# Patient Record
Sex: Female | Born: 1955 | Race: Black or African American | Hispanic: No | Marital: Single | Smoking: Former smoker
Health system: Southern US, Community
[De-identification: ages and names within clinical notes are randomized; demographics above are authoritative.]

## PROBLEM LIST (undated history)

## (undated) DIAGNOSIS — Z9889 Other specified postprocedural states: Secondary | ICD-10-CM

## (undated) DIAGNOSIS — T8859XA Other complications of anesthesia, initial encounter: Secondary | ICD-10-CM

## (undated) DIAGNOSIS — E46 Unspecified protein-calorie malnutrition: Secondary | ICD-10-CM

## (undated) DIAGNOSIS — K5900 Constipation, unspecified: Secondary | ICD-10-CM

## (undated) DIAGNOSIS — I639 Cerebral infarction, unspecified: Secondary | ICD-10-CM

## (undated) DIAGNOSIS — N183 Chronic kidney disease, stage 3 unspecified: Secondary | ICD-10-CM

## (undated) DIAGNOSIS — R112 Nausea with vomiting, unspecified: Secondary | ICD-10-CM

## (undated) DIAGNOSIS — R9389 Abnormal findings on diagnostic imaging of other specified body structures: Secondary | ICD-10-CM

## (undated) DIAGNOSIS — I679 Cerebrovascular disease, unspecified: Secondary | ICD-10-CM

## (undated) DIAGNOSIS — I517 Cardiomegaly: Secondary | ICD-10-CM

## (undated) DIAGNOSIS — I671 Cerebral aneurysm, nonruptured: Secondary | ICD-10-CM

## (undated) DIAGNOSIS — I509 Heart failure, unspecified: Secondary | ICD-10-CM

## (undated) DIAGNOSIS — I10 Essential (primary) hypertension: Secondary | ICD-10-CM

## (undated) DIAGNOSIS — I5022 Chronic systolic (congestive) heart failure: Secondary | ICD-10-CM

## (undated) HISTORY — PX: BREAST EXCISIONAL BIOPSY: SUR124

## (undated) HISTORY — DX: Abnormal findings on diagnostic imaging of other specified body structures: R93.89

## (undated) HISTORY — DX: Constipation, unspecified: K59.00

## (undated) HISTORY — DX: Heart failure, unspecified: I50.9

## (undated) HISTORY — DX: Unspecified protein-calorie malnutrition: E46

## (undated) HISTORY — DX: Cardiomegaly: I51.7

## (undated) HISTORY — DX: Cerebral aneurysm, nonruptured: I67.1

## (undated) HISTORY — DX: Essential (primary) hypertension: I10

## (undated) HISTORY — DX: Chronic systolic (congestive) heart failure: I50.22

## (undated) HISTORY — DX: Cerebrovascular disease, unspecified: I67.9

## (undated) HISTORY — DX: Chronic kidney disease, stage 3 unspecified: N18.30

## (undated) HISTORY — DX: Cerebral infarction, unspecified: I63.9

---

## 2000-12-05 ENCOUNTER — Encounter (INDEPENDENT_AMBULATORY_CARE_PROVIDER_SITE_OTHER): Payer: Self-pay | Admitting: *Deleted

## 2000-12-05 ENCOUNTER — Ambulatory Visit (HOSPITAL_BASED_OUTPATIENT_CLINIC_OR_DEPARTMENT_OTHER): Admission: RE | Admit: 2000-12-05 | Discharge: 2000-12-05 | Payer: Self-pay | Admitting: General Surgery

## 2005-08-26 ENCOUNTER — Other Ambulatory Visit: Admission: RE | Admit: 2005-08-26 | Discharge: 2005-08-26 | Payer: Self-pay | Admitting: Obstetrics and Gynecology

## 2005-09-05 ENCOUNTER — Encounter: Admission: RE | Admit: 2005-09-05 | Discharge: 2005-09-05 | Payer: Self-pay | Admitting: Family Medicine

## 2005-10-18 ENCOUNTER — Encounter: Admission: RE | Admit: 2005-10-18 | Discharge: 2005-10-18 | Payer: Self-pay | Admitting: Family Medicine

## 2006-09-22 ENCOUNTER — Encounter: Admission: RE | Admit: 2006-09-22 | Discharge: 2006-09-22 | Payer: Self-pay | Admitting: Family Medicine

## 2008-07-22 ENCOUNTER — Encounter (INDEPENDENT_AMBULATORY_CARE_PROVIDER_SITE_OTHER): Payer: Self-pay | Admitting: Cardiology

## 2008-07-22 ENCOUNTER — Inpatient Hospital Stay (HOSPITAL_COMMUNITY): Admission: EM | Admit: 2008-07-22 | Discharge: 2008-07-25 | Payer: Self-pay | Admitting: Emergency Medicine

## 2008-07-22 IMAGING — CR DG CHEST 1V PORT
1 series · 1 of 1 positions shown · non-contrast
Comparison: None

CLINICAL DATA: Cough, shortness of breath, wheezing

PORTABLE CHEST - 1 VIEW

[view not recorded]
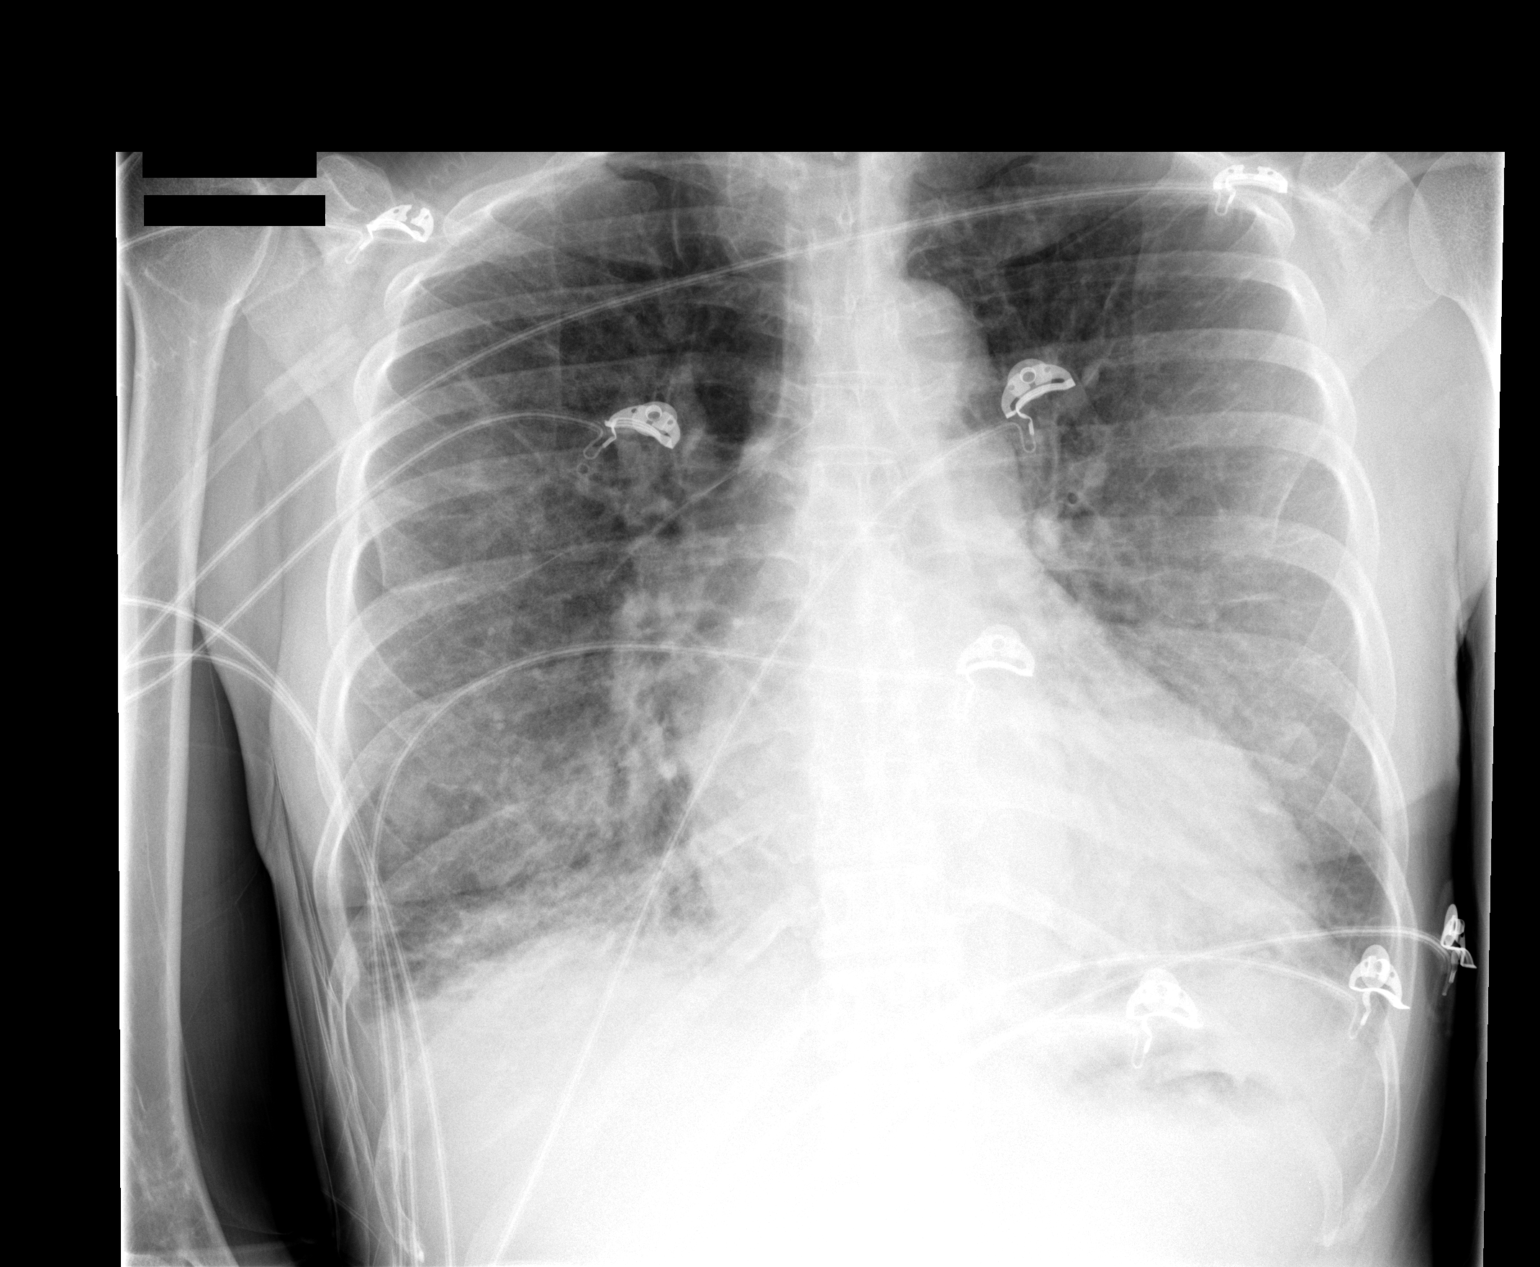

[1 of 1 positions shown; findings below may reference images not displayed]

FINDINGS: Borderline cardiomegaly noted.  Bilateral mild central
increased bronchovascular markings without convincing edema.  There
is right basilar airspace disease atelectasis or infiltrate.
Bilateral nodular nipple shadow is noted.
IMPRESSION: Bilateral mild central increased bronchovascular markings without
convincing edema.  There is hazy airspace disease right basilar
probable atelectasis or infiltrate.

## 2008-07-24 IMAGING — CR DG CHEST 2V
2 series · 2 of 2 positions shown · non-contrast
Comparison: [DATE].

CLINICAL DATA: Follow-up lung densities.

CHEST - 2 VIEW

[w chest pa]
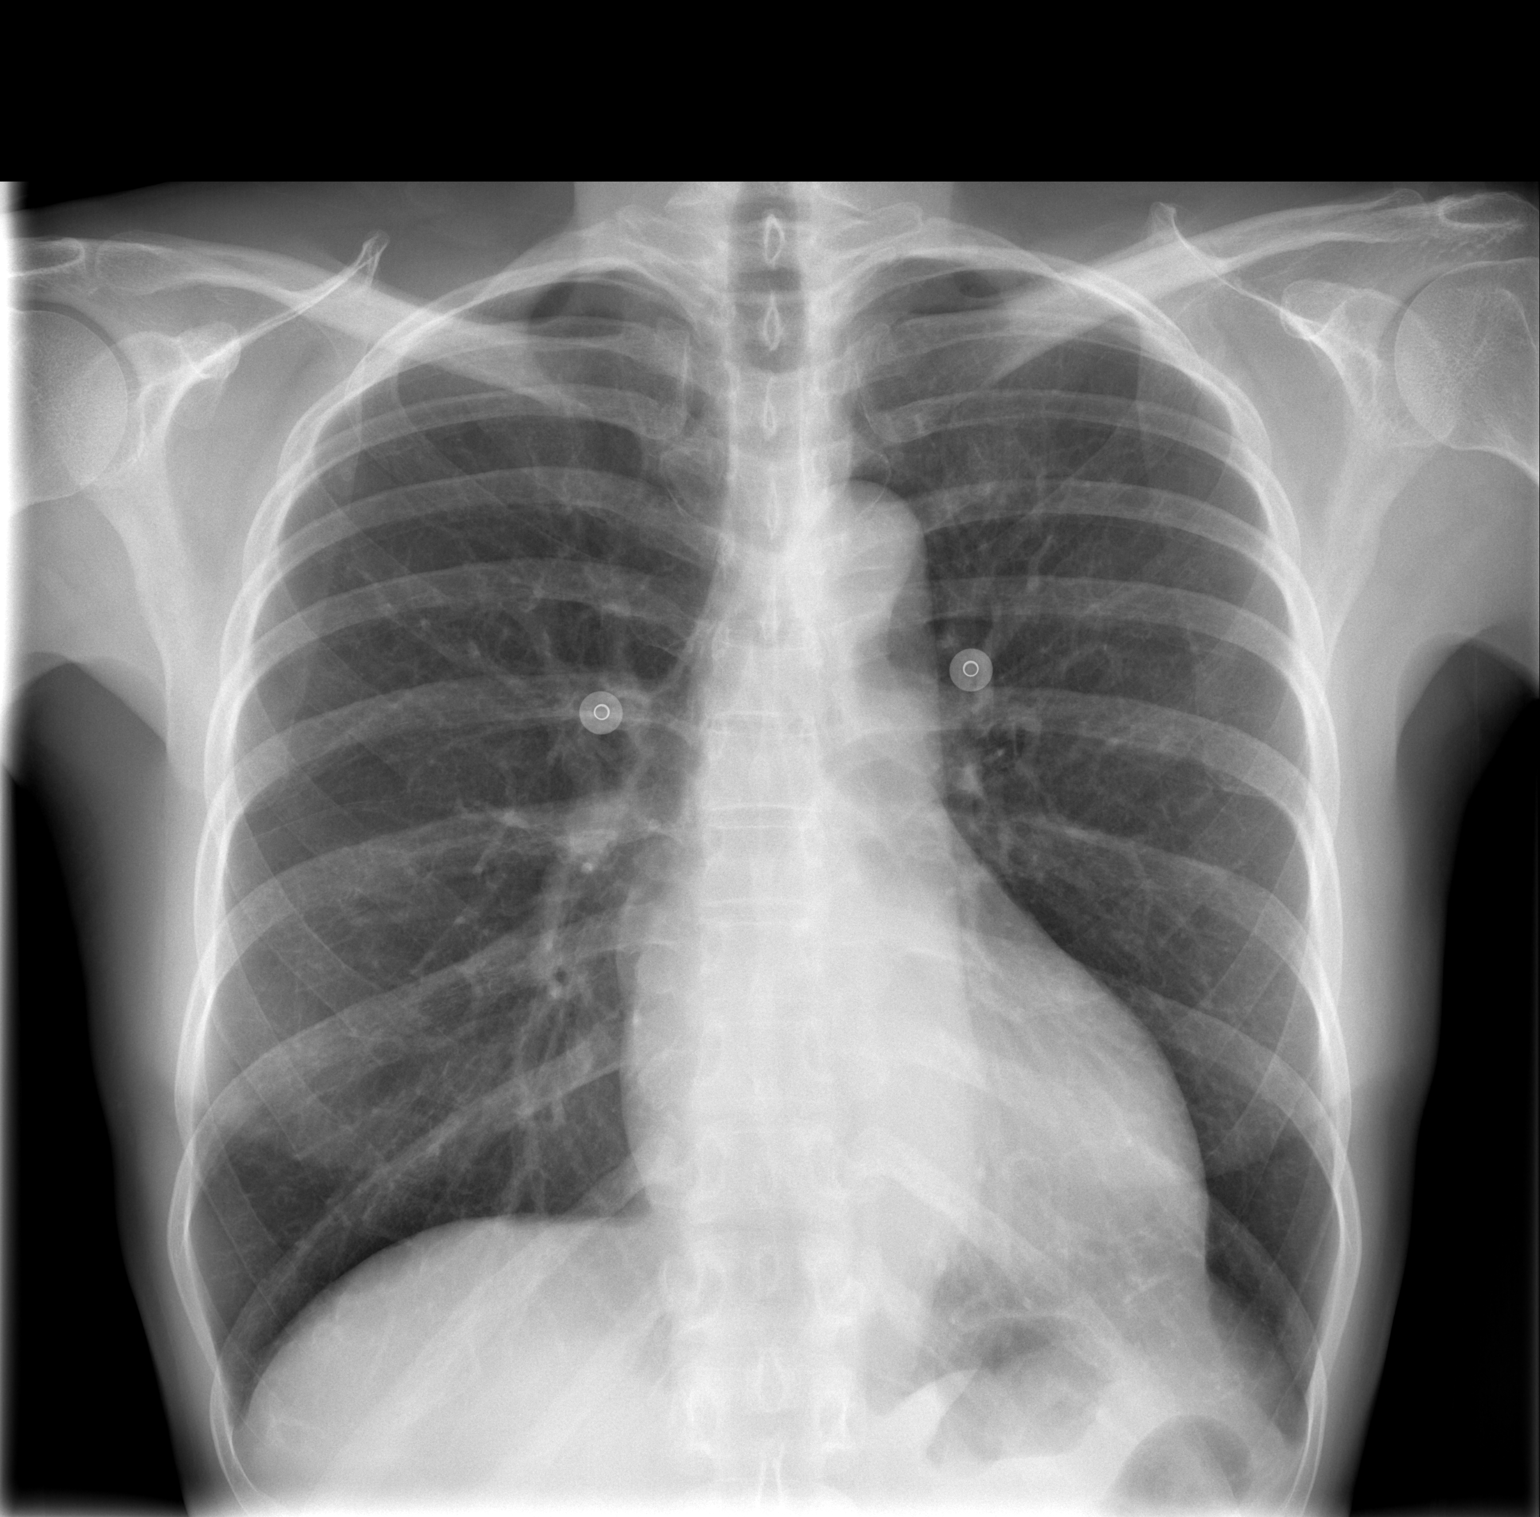

[w chest lat]
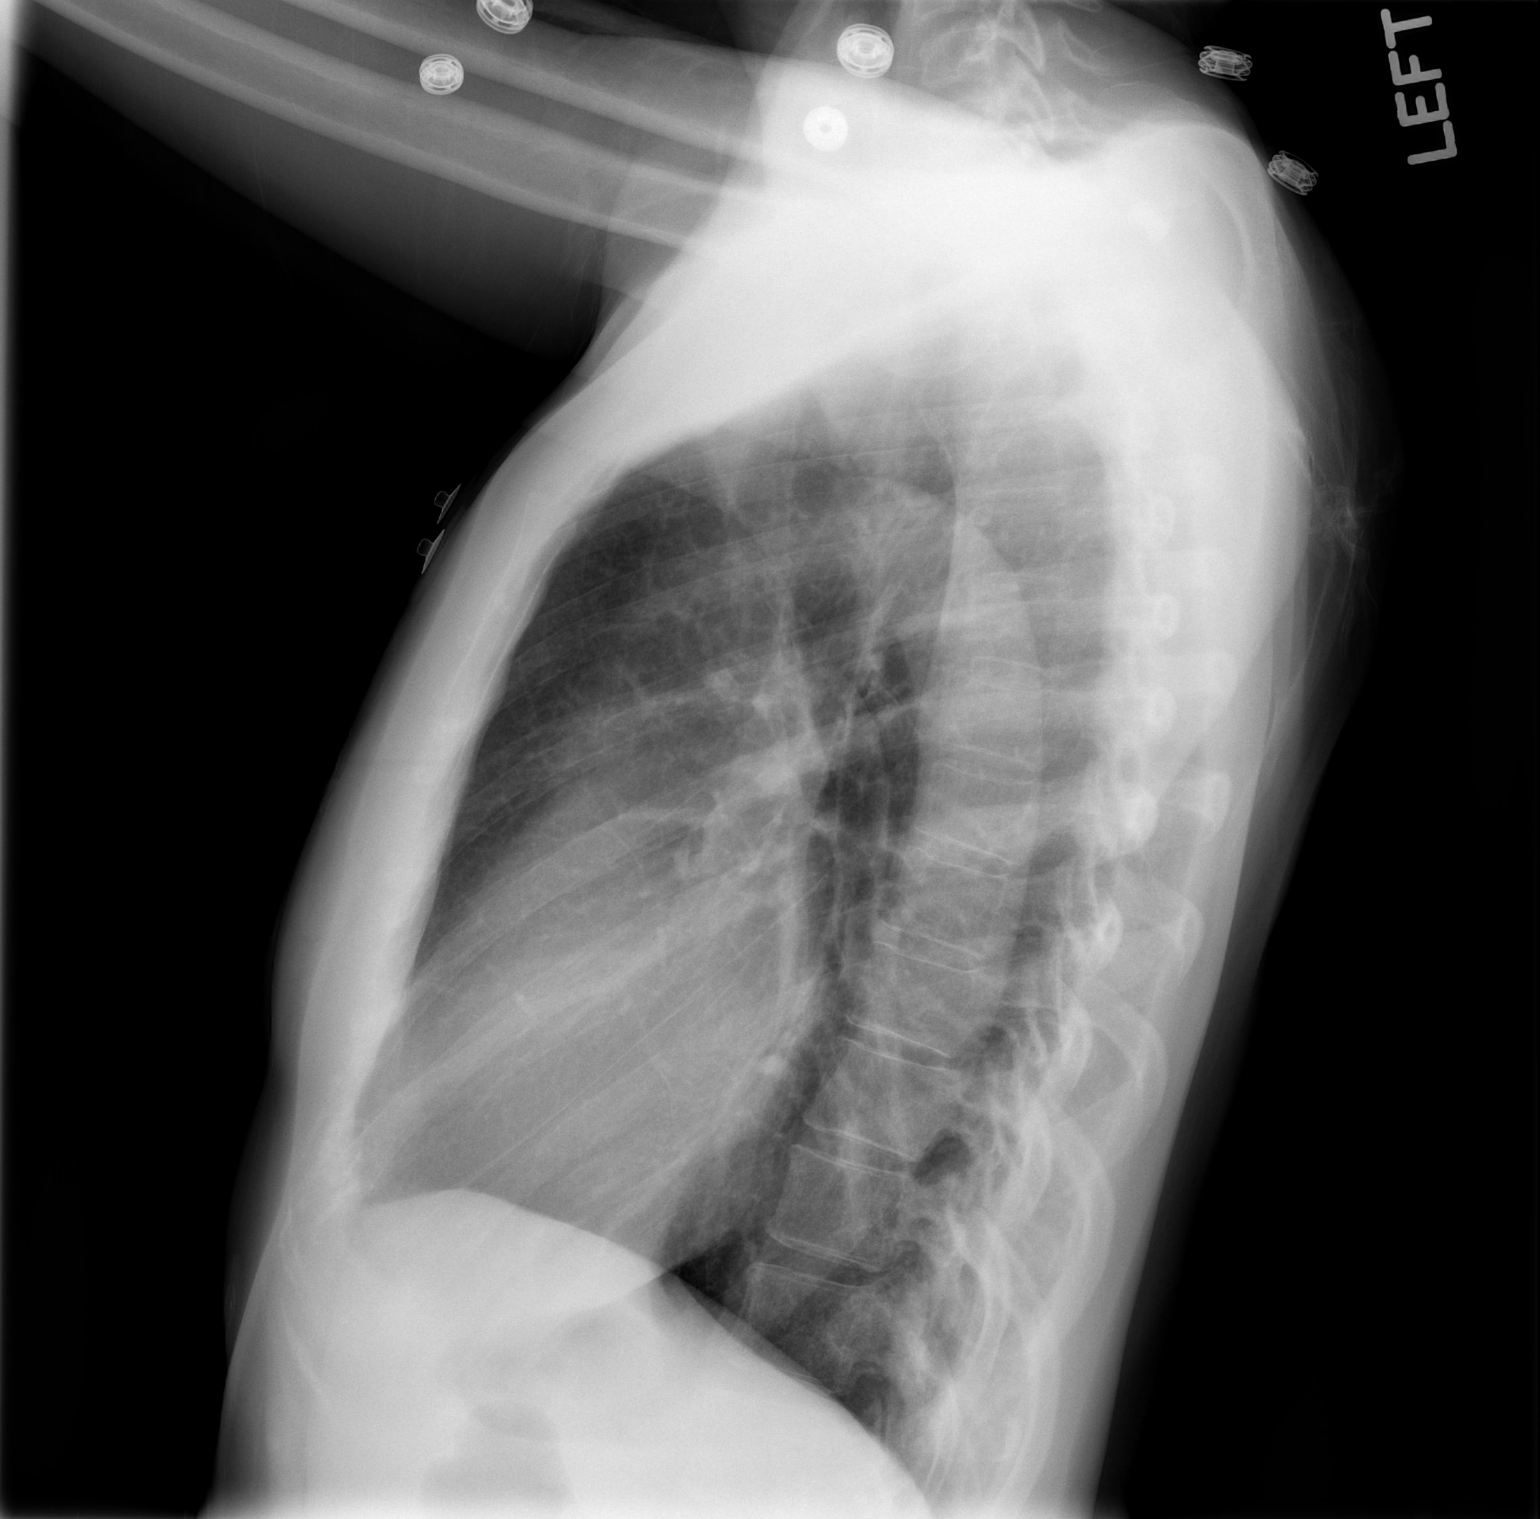

[2 of 2 positions shown; findings below may reference images not displayed]

FINDINGS: Improved inspiration.  Borderline enlarged cardiac
silhouette with an interval decrease in size.  Clear lungs with
resolution of the previously demonstrated bilateral airspace
opacity and prominence of the interstitial markings.  Unremarkable
bones.
IMPRESSION: Resolved changes of congestive heart failure.

## 2009-02-22 ENCOUNTER — Other Ambulatory Visit: Admission: RE | Admit: 2009-02-22 | Discharge: 2009-02-22 | Payer: Self-pay | Admitting: Family Medicine

## 2010-10-08 LAB — CBC
HCT: 36.4 % (ref 36.0–46.0)
MCHC: 32.6 g/dL (ref 30.0–36.0)
MCV: 86.9 fL (ref 78.0–100.0)
RBC: 4.16 MIL/uL (ref 3.87–5.11)
RDW: 13.6 % (ref 11.5–15.5)
WBC: 10.5 10*3/uL (ref 4.0–10.5)

## 2010-10-08 LAB — COMPREHENSIVE METABOLIC PANEL
Albumin: 3.5 g/dL (ref 3.5–5.2)
Calcium: 9.2 mg/dL (ref 8.4–10.5)
Chloride: 105 mEq/L (ref 96–112)
GFR calc non Af Amer: 50 mL/min — ABNORMAL LOW (ref >60)
Potassium: 3.7 mEq/L (ref 3.5–5.1)
Sodium: 141 mEq/L (ref 135–145)
Total Bilirubin: 1.4 mg/dL — ABNORMAL HIGH (ref 0.3–1.2)
Total Protein: 6.2 g/dL (ref 6.0–8.3)

## 2010-10-08 LAB — DIFFERENTIAL
Eosinophils Absolute: 0.1 10*3/uL (ref 0.0–0.7)
Eosinophils Relative: 1 % (ref 0–5)
Lymphocytes Relative: 11 % — ABNORMAL LOW (ref 12–46)
Lymphs Abs: 1.1 10*3/uL (ref 0.7–4.0)
Monocytes Relative: 2 % — ABNORMAL LOW (ref 3–12)
Neutro Abs: 9.1 10*3/uL — ABNORMAL HIGH (ref 1.7–7.7)
Neutrophils Relative %: 87 % — ABNORMAL HIGH (ref 43–77)

## 2010-10-08 LAB — URINALYSIS, ROUTINE W REFLEX MICROSCOPIC
Bilirubin Urine: NEGATIVE
Glucose, UA: NEGATIVE mg/dL
Nitrite: NEGATIVE
pH: 6.5 (ref 5.0–8.0)

## 2010-10-08 LAB — BRAIN NATRIURETIC PEPTIDE
Pro B Natriuretic peptide (BNP): 1911 pg/mL — ABNORMAL HIGH (ref 0.0–100.0)
Pro B Natriuretic peptide (BNP): 446 pg/mL — ABNORMAL HIGH (ref 0.0–100.0)

## 2010-10-08 LAB — POCT CARDIAC MARKERS: CKMB, poc: 2 ng/mL (ref 1.0–8.0)

## 2010-10-08 LAB — BASIC METABOLIC PANEL
BUN: 16 mg/dL (ref 6–23)
CO2: 24 mEq/L (ref 19–32)
CO2: 30 mEq/L (ref 19–32)
Calcium: 8.9 mg/dL (ref 8.4–10.5)
Chloride: 106 mEq/L (ref 96–112)
Creatinine, Ser: 0.91 mg/dL (ref 0.4–1.2)
GFR calc Af Amer: 44 mL/min — ABNORMAL LOW (ref >60)
GFR calc non Af Amer: >60 mL/min (ref >60)
Glucose, Bld: 127 mg/dL — ABNORMAL HIGH (ref 70–99)
Potassium: 3.5 mEq/L (ref 3.5–5.1)

## 2010-10-08 LAB — CULTURE, BLOOD (ROUTINE X 2): Culture: NO GROWTH

## 2010-10-08 LAB — URINE MICROSCOPIC-ADD ON

## 2010-10-09 LAB — BASIC METABOLIC PANEL
CO2: 27 mEq/L (ref 19–32)
Calcium: 8.8 mg/dL (ref 8.4–10.5)
Chloride: 103 mEq/L (ref 96–112)
GFR calc Af Amer: 45 mL/min — ABNORMAL LOW (ref >60)
GFR calc non Af Amer: 37 mL/min — ABNORMAL LOW (ref >60)
Glucose, Bld: 99 mg/dL (ref 70–99)
Sodium: 137 mEq/L (ref 135–145)

## 2010-11-06 NOTE — H&P (Signed)
Kendra Richardson, Kendra Richardson NO.:  [id number]   MEDICAL RECORD NO.:  [id number]          PATIENT TYPE:  EMS   LOCATION:  [facility]                         FACILITY:  [facility]   PHYSICIAN:  Hollice Espy, M.D.DATE OF BIRTH:  July 09, 1955   DATE OF ADMISSION:  07/22/2008  DATE OF DISCHARGE:                              HISTORY & PHYSICAL   .The patient's PCP is Dr. Duane Lope.   CONSULTANT:  Dr. Armanda Magic, cardiology.   CHIEF COMPLAINT:  Shortness breath.   HISTORY OF PRESENT ILLNESS:  The patient is a 55 year old African  American female with past medical history of tobacco abuse, reportedly  cardiomegaly and hypertension, who for the last month has noted  increasing shortness of breath and over the last week has gotten much  more markedly short of breath. Mild nonproductive cough with clear  sputum.  She came in and was evaluated by her PCPs partner,  Dr.  Corliss Blacker, who found the patient to be short of breath with concern about  the possibility of CHF versus COPD exacerbation, and had the patient  sent over to the emergency room. En route the patient required a large  amount of oxygenation, 4-6 liters, and then following that needed  multiple breathing treatments, initially satting in the 70-80% on  breathing treatments and on face mask she was able to sat up to 95 and  96%.  She then improved to the point where she was able to sat 98% on 3  liters.   Labs were drawn on  the patient. She was found to have a BNP of 1055  with no previous history of CHF. Renal function was normal. White count  10.5 but with an 87% shift.  Cardiac markers were unremarkable.  Urinalysis unremarkable as well.  An ABG has been ordered, and results  are currently pending.  Chest x-ray then showed bilateral mild central  increased bronchovascular markings without convincing edema, hazy air  space disease, right basilar atelectasis versus infiltrate.  The patient  was evaluated by cardiology  who felt that she might have some mild CHF,  but may be more exacerbated by an underlying lung process.  Currently  the patient states her breathing is a little bit better.  She denies any  headaches, vision changes, dysphagia, chest pain, palpitations.  She has  complained of some mild shortness although much better while at rest and  with oxygen. Some mild wheezing and mild coughing productive of clear  sputum. No abdominal pain.  No hematuria, dysuria, constipation,  diarrhea, focal extremity numbness, weakness, or pain.   REVIEW OF SYSTEMS:  Otherwise negative.   The patient's past medical history includes hypertension, cardiomegaly,  and tobacco abuse.   MEDICATIONS:  She is on quinapril. She previously was on a diuretic  although it was stopped some time ago.  She smokes about one cigarette  pack every 2 weeks. Denies any alcohol or drug use.   FAMILY HISTORY:  Noted for CAD and MI.   PHYSICAL EXAMINATION:  VITALS:  On admission temperature 96.9. Heart  rate initially 105, down to 98. Blood pressure initially 221/146, down  to 156/81. Respirations 28, now down to 20. O2 sat 93% initially on 4  liters, up to 98% on 3.  GENERAL:  She is alert and oriented x3, in some moderate distress  secondary to weakness and some mild shortness of breath.  HEENT: Normocephalic atraumatic. Mucous membranes are moist. She has no  carotid bruits.  HEART:  Regular rate and rhythm.  Soft 2/6 systolic ejection murmur.  LUNGS:  Decreased breath sounds throughout.  ABDOMEN:  Soft, nontender, nondistended.  Positive bowel sounds.  EXTREMITIES:  No clubbing, cyanosis, trace pitting edema.   LAB WORK:  BNP 1055.  Sodium 142, potassium 3.5, chloride 106, bicarb  24, BUN 15, creatinine 0.9, glucose 127.  White count 10.5, H and H 13.7  and 41. MCV 88, platelet count 164, with 87% neutrophils.  CPK 114, MB  2, troponin-I less than 0.05.  Urinalysis is normal.   ASSESSMENT AND PLAN:  1. Congestive  heart failure.  .  2. Chronic obstructive pulmonary disease.  3. Tobacco.  4. Hypertension.   Will await her ABG. In the meantime will treat with DuoNeb, oxygen and  IV Lasix. Dr. Mayford Knife has seen and assessed the patient and is ordering  an echo, the results of which are pending.  In the meantime, will check  serial enzymes and a TSH.  Please note that also the patient is on a  nitroglycerin drip, and her blood pressure should improve as her  oxygenation does. In the meantime will go with clonidine 0.2 mg p.o.  b.i.d. and titrate up.      Hollice Espy, M.D.  Electronically Signed     SKK/MEDQ  D:  07/22/2008  T:  07/22/2008  Job:  [id number]   cc:   Armanda Magic, M.D.  Vianne Bulls, M.D.

## 2010-11-06 NOTE — Discharge Summary (Signed)
NAMEEVY, LUTTERMAN NO.:  [id number]   MEDICAL RECORD NO.:  [id number]          PATIENT TYPE:  INP   LOCATION:  4738                         FACILITY:  [facility]   PHYSICIAN:  Hollice Espy, M.D.DATE OF BIRTH:  1955/08/14   DATE OF ADMISSION:  07/22/2008  DATE OF DISCHARGE:  07/25/2008                               DISCHARGE SUMMARY   PRIMARY CARE PHYSICIAN:  C. Duane Lope, MD   CONSULTANTS:  Dr. Armanda Magic, cardiology   DISCHARGE DIAGNOSES:  1. Acute systolic congestive heart failure, new onset diagnosis, with      ejection fraction of 45% to 50%.  2. Hypertension.  3. Tobacco abuse.  4. Hypokalemia, now resolved.  5. Acute renal failure.   HOSPITAL COURSE:  The patient is a 55 year old African American female  with past medical history of tobacco abuse, hypertension and reported  cardiomegaly who had presented with increased shortness of breath, and  initially, she was found to have some low-grade temps, and there was a  concern for the possibility she may be having evidence of pneumonia as  well.  A BNP was checked and found to be elevated around 1700.  Cardiology was consulted but deferred patient to medicine because they  thought that she also may have a respiratory component as well such as  pneumonia or COPD.  The patient was diuresed and by hospital day 2 was  showing some signs of improvement.  She had an initial white count of  10.5 with an 87% shift.  Her white count on hospital day 2 was only down  to 8.2 following diuresis.  She was feeling better and again showed no  signs of any infection, so this was considered to all be secondary to  congestive heart failure.  An echocardiogram was performed which showed  a decreased ejection fraction down to 45% to 50% and mild.  By hospital  day 3, the patient had responded well to diuresis.  Her BNP had come  down to 400.  A chest x-ray showed resolution of CHF.  By July 24, 2008, her BUN and  creatinine were slightly up to 28 and 1.48  respectively.  At this point, her Lasix was held, and she was put on  some gentle hydration.  By day of discharge, 07/25/2008, her BUN was 38,  creatinine 1.47.  Initially, I talked to the patient about staying for  an additional day of IV fluids, but she said she was otherwise feeling  much better and would like very much to go home.  The plan is then to  hold the patient's Lasix and restart it on July 27, 2008.  I have  spoken with cardiology and will set up an appointment for followup with  Dr. Eldridge Dace in 1 week's time on August 01, 2008, at 1:15 p.m.  In  regards to her hypokalemia, following Lasix, her potassium came down to  3.2.  On July 24, 2008, it was replaced.  Her potassium as of the day  of discharge was 3.9.  In regards to her renal failure, this likely was  both in combination to diuresis  and some over- diuresis.  She may have a  slightly elevated BUN and creatinine from this point on secondary to  Lasix, but we will hold it for 2 days and restart.  In regards to her  hypertension, initially, she did not know her previous antihypertensive.  We put her on clonidine to keep her blood pressures which were initially  high down.  When her family brought in the medicine, she was found be on  quinapril.  We would favor continuing the patient on quinapril rather  than clonidine which is better for her congestive heart failure.  With  her slight increase in BUN and creatinine, we will plan to hold  quinapril for 2 days and restart on July 27, 2008.  Dr. Eldridge Dace and  Dr. Tenny Craw can follow her renal function levels when she sees them for  followup visits.   The patient will follow with Dr. Eldridge Dace again on August 01, 2008, and  follow up with her PCP, Dr. Tenny Craw, in 2 weeks.   DISCHARGE DIET:  Heart-healthy diet.  She is receiving instruction  booklet for CHF to advise her to check her daily weights and watch for  lower extremity  swelling and also to keep her on a heart-healthy diet.   DISPOSITION:  Her overall disposition is improved.   ACTIVITY:  Will be slowly increased, and she is being discharged to  home.      Hollice Espy, M.D.  Electronically Signed     SKK/MEDQ  D:  07/25/2008  T:  07/25/2008  Job:  [id number]   cc:   C. Duane Lope, M.D.  Corky Crafts, MD  Armanda Magic, M.D.

## 2010-11-09 NOTE — Op Note (Signed)
[facility]  Patient:    Kendra Richardson, Kendra Richardson                   MRN: [id number] Proc. Date: 12/05/00 Attending:  Gita Kudo, M.D. CC:         Bing Neighbors. Tenny Craw, M.D.   Operative Report  PREOPERATIVE DIAGNOSIS:  Hemorrhoid.  POSTOPERATIVE DIAGNOSIS:  Hemorrhoid.  OPERATION PERFORMED:  Hemorrhoidectomy.  SURGEON:  Gita Kudo, M.D.  ANESTHESIA:  General.  INDICATIONS FOR PROCEDURE:  The patient is a 55 year old female brought in for elective surgery.  She had hemorrhoidectomy by myself approximately 20 years go.  She has developed a solitary large hemorrhoid in her posterior midline. It is friable, hurts and bleeds.  OPERATIVE FINDINGS:  The patient had a large irritated mixed internal and external hemorrhoid in the posterior midline.  There is a smaller internal hemorrhoid just in the left posterolateral position.  DESCRIPTION OF PROCEDURE:  The patient was placed in the lithotomy position and prepped and draped in a standard fashion.  Anoscope inserted and the findings above were noted.  With good exposure, a hemostat was placed on the skin distal to the hemorrhoid and then a second one at the junction of the mucosa and skin.  With good exposure and traction, a 2-0 chromic suture was placed as a figure-of-eight to secure the pedicle and then cautery used to excise the hemorrhoidal complex in a diamond-shaped fashion, conserving mucosa. The hemorrhoid was then ligated at its stalk with the same suture and the ends of the suture left long and the hemorrhoid excised.  The wound was then infiltrated with Marcaine with epinephrine for postoperative analgesia and closed in a continuous fashion with the same running 2-0 chromic suture. It was extended as a subcutaneous suture closing the wound totally and the ends of the suture looked long.  The small left posterolateral hemorrhoid was then oversewn with 2-0 chromic.  The wound was  checked for hemostasis, dressed.  The patient was transferred to the recovery room in good condition. DD:  12/05/00 TD:  12/05/00 Job: [id number]

## 2014-08-18 ENCOUNTER — Other Ambulatory Visit (HOSPITAL_COMMUNITY)
Admission: RE | Admit: 2014-08-18 | Discharge: 2014-08-18 | Disposition: A | Discharge status: Home / Self Care | Payer: Self-pay | Source: Ambulatory Visit | Attending: Family Medicine | Admitting: Family Medicine

## 2014-08-18 ENCOUNTER — Other Ambulatory Visit: Payer: Self-pay | Admitting: Family Medicine

## 2014-08-18 DIAGNOSIS — Z124 Encounter for screening for malignant neoplasm of cervix: Secondary | ICD-10-CM | POA: Insufficient documentation

## 2014-08-19 LAB — CYTOLOGY - PAP

## 2018-12-03 LAB — IFOBT (OCCULT BLOOD): IFOBT: NEGATIVE

## 2019-05-10 ENCOUNTER — Other Ambulatory Visit: Payer: Self-pay | Admitting: Family Medicine

## 2019-05-10 DIAGNOSIS — Z1231 Encounter for screening mammogram for malignant neoplasm of breast: Secondary | ICD-10-CM

## 2019-05-24 ENCOUNTER — Other Ambulatory Visit: Payer: Self-pay

## 2019-05-24 ENCOUNTER — Ambulatory Visit
Admission: RE | Admit: 2019-05-24 | Discharge: 2019-05-24 | Disposition: A | Discharge status: Home / Self Care | Payer: No Typology Code available for payment source | Source: Ambulatory Visit | Attending: Family Medicine | Admitting: Family Medicine

## 2019-05-24 DIAGNOSIS — Z1231 Encounter for screening mammogram for malignant neoplasm of breast: Secondary | ICD-10-CM

## 2019-05-24 IMAGING — MG DIGITAL SCREENING BILAT W/ TOMO W/ CAD
8 series · 9 of 24 positions shown · non-contrast
Comparison: None.

CLINICAL DATA: Screening.

EXAM:
DIGITAL SCREENING BILATERAL MAMMOGRAM WITH TOMO AND CAD

[L CC synth-2D]
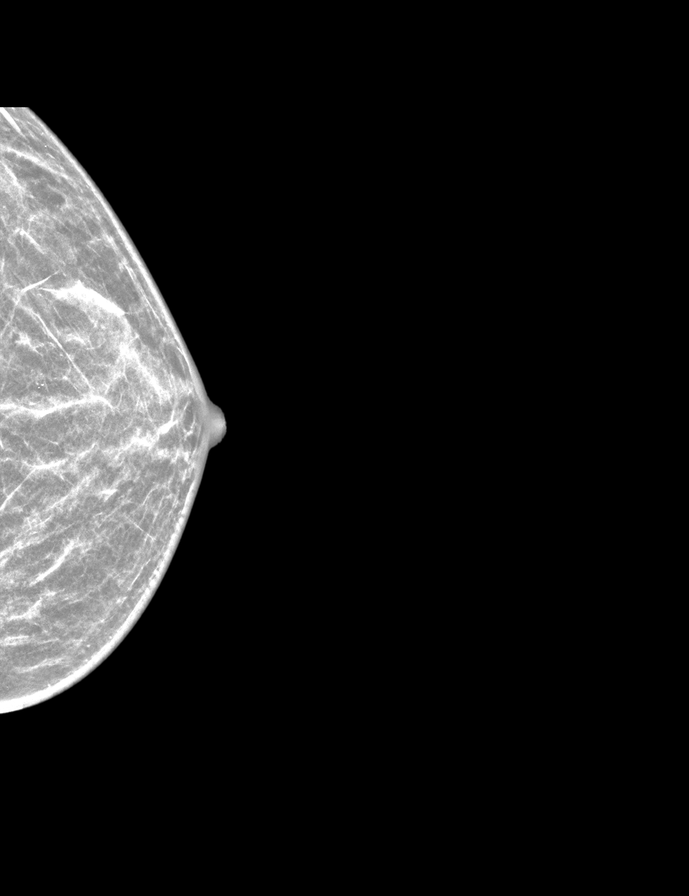

[L MLO synth-2D]
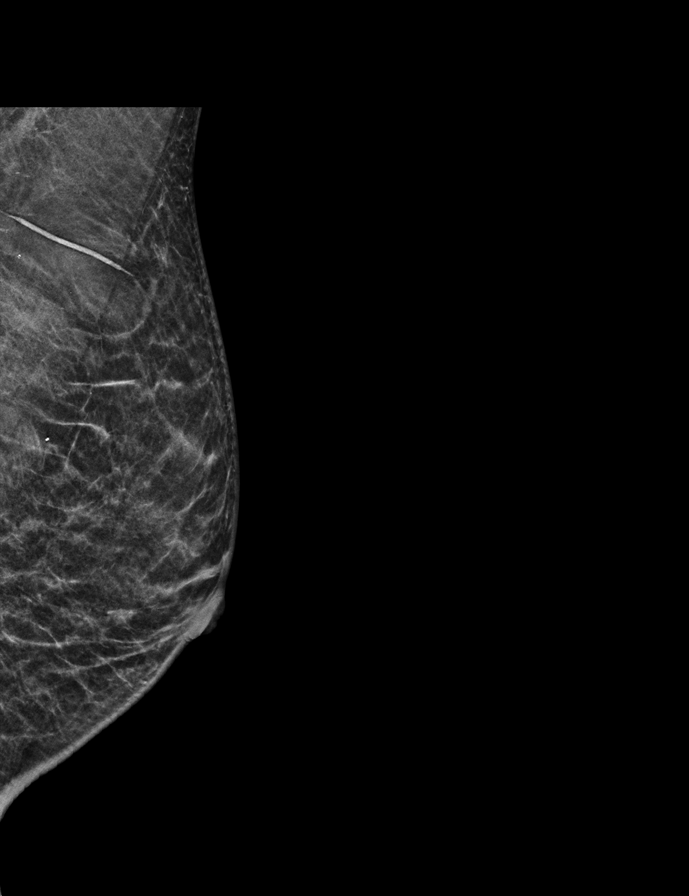

[R CC synth-2D]
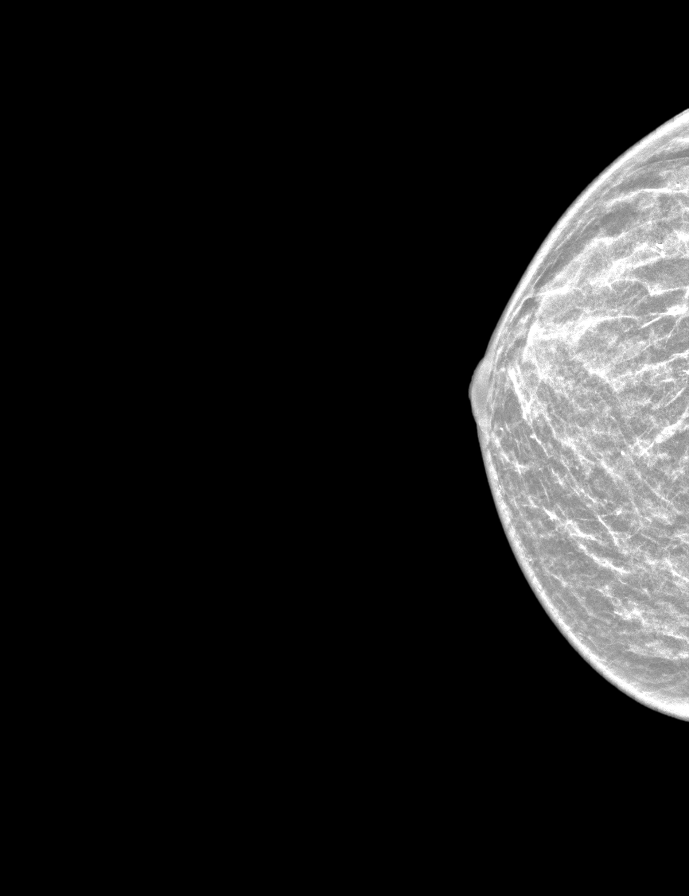

[R MLO synth-2D]
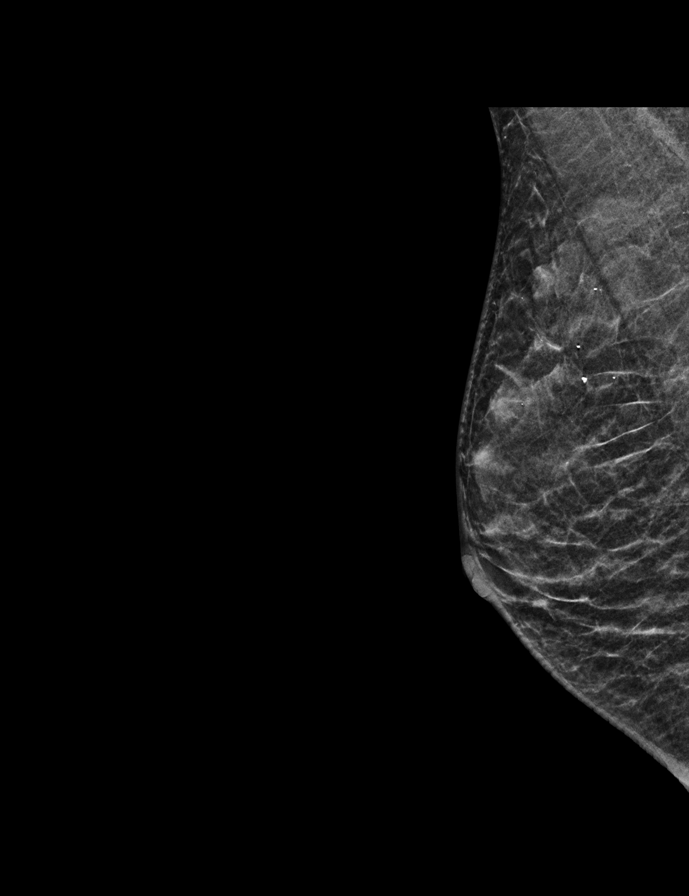

[L CC tomo · 2 of 36 frames shown]
[frame 12/36]
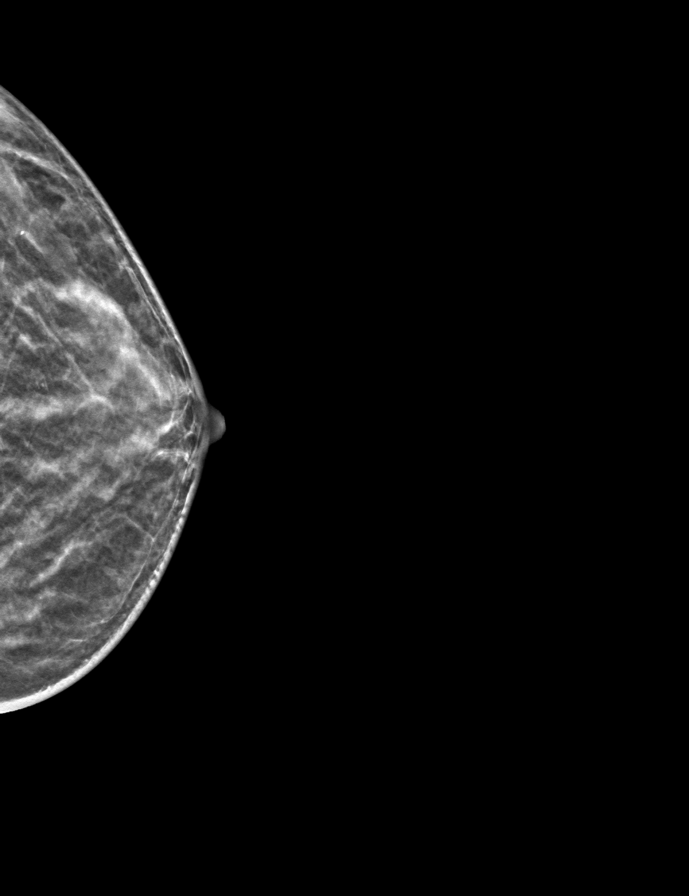
[frame 19/36]
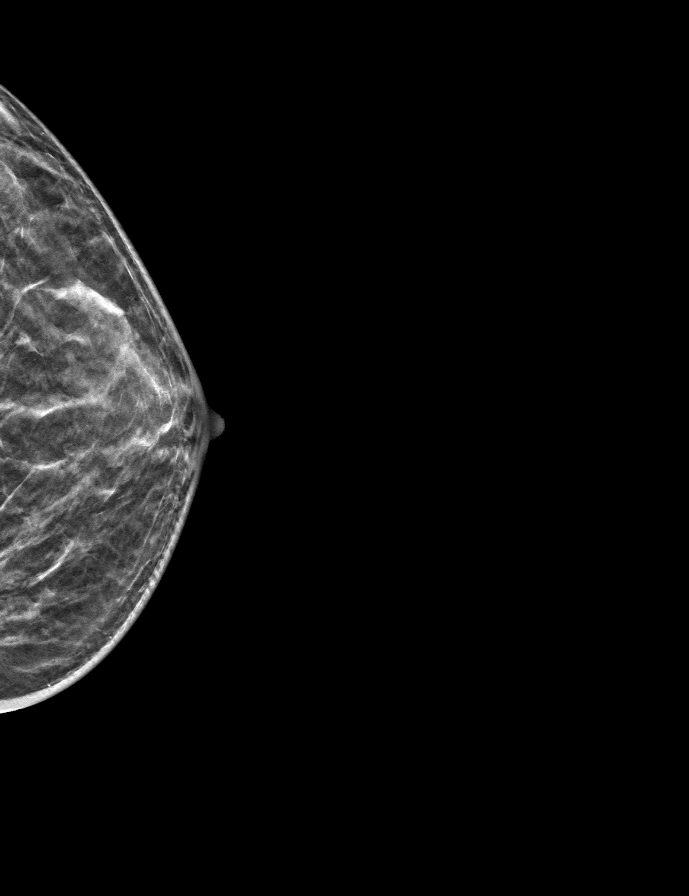

[L MLO tomo · tomo slice 17/33.0]
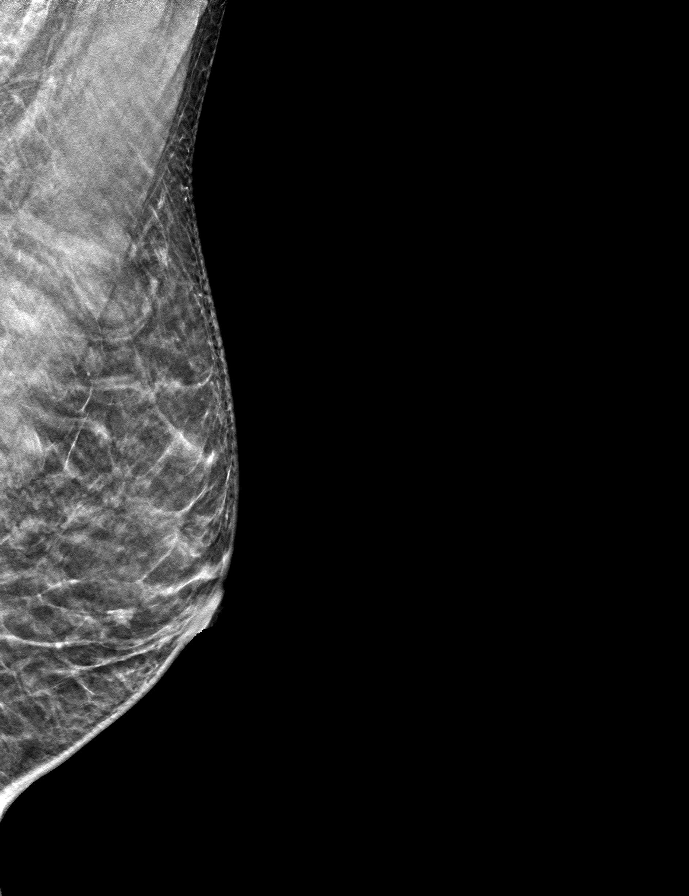

[R CC tomo · tomo slice 17/33.0]
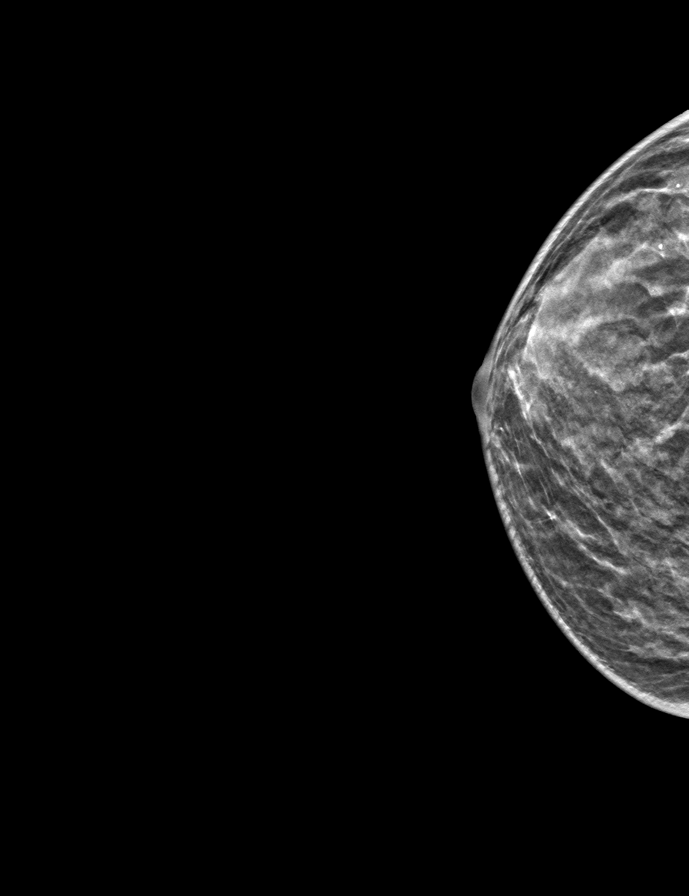

[R MLO tomo · tomo slice 17/33.0]
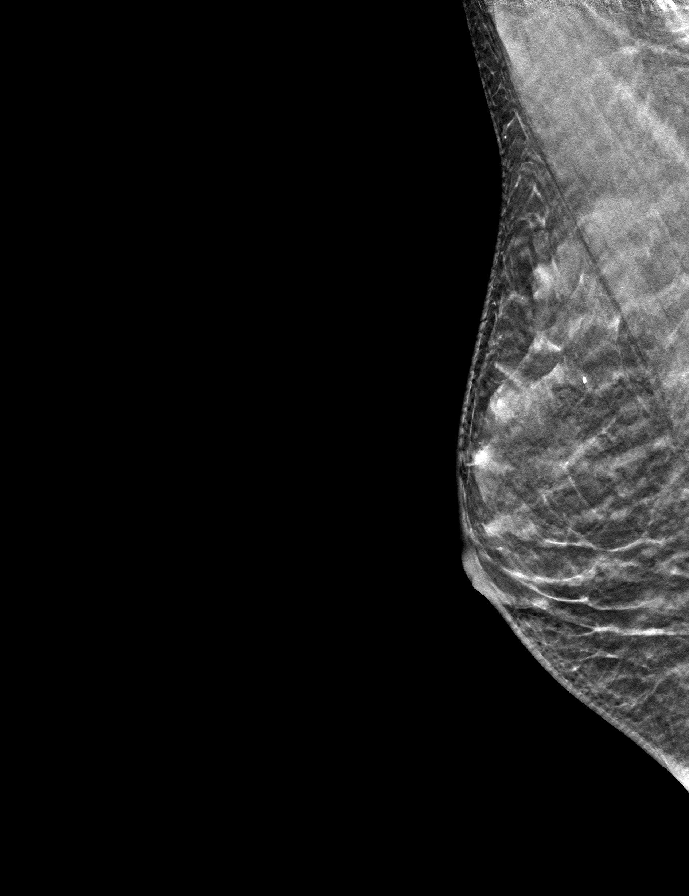

[9 of 24 positions shown; findings below may reference images not displayed]

ACR Breast Density Category b: There are scattered areas of
fibroglandular density.
FINDINGS: In the left breast, a possible asymmetry warrants further
evaluation. In the right breast, no findings suspicious for
malignancy. Images were processed with CAD.
IMPRESSION: Further evaluation is suggested for possible asymmetry in the left
breast.

RECOMMENDATION:
Diagnostic mammogram and possibly ultrasound of the left breast.
(Code:[OL])

The patient will be contacted regarding the findings, and additional
imaging will be scheduled.

BI-RADS CATEGORY  0: Incomplete. Need additional imaging evaluation
and/or prior mammograms for comparison.

## 2019-05-25 ENCOUNTER — Other Ambulatory Visit: Payer: Self-pay | Admitting: Family Medicine

## 2019-05-25 DIAGNOSIS — R928 Other abnormal and inconclusive findings on diagnostic imaging of breast: Secondary | ICD-10-CM

## 2019-05-31 ENCOUNTER — Ambulatory Visit: Payer: No Typology Code available for payment source

## 2019-05-31 ENCOUNTER — Other Ambulatory Visit: Payer: Self-pay

## 2019-05-31 ENCOUNTER — Ambulatory Visit
Admission: RE | Admit: 2019-05-31 | Discharge: 2019-05-31 | Disposition: A | Discharge status: Home / Self Care | Payer: No Typology Code available for payment source | Source: Ambulatory Visit | Attending: Family Medicine | Admitting: Family Medicine

## 2019-05-31 DIAGNOSIS — R928 Other abnormal and inconclusive findings on diagnostic imaging of breast: Secondary | ICD-10-CM

## 2019-05-31 IMAGING — MG MM DIGITAL DIAGNOSTIC UNILAT*L* W/ TOMO W/ CAD
4 series · 4 of 12 positions shown · non-contrast
Comparison: Screening exam, [DATE].

CLINICAL DATA: Screening recall for a possible left breast
asymmetry.

EXAM:
DIGITAL DIAGNOSTIC UNILATERAL LEFT MAMMOGRAM WITH CAD AND TOMO

[L CC synth-2D]
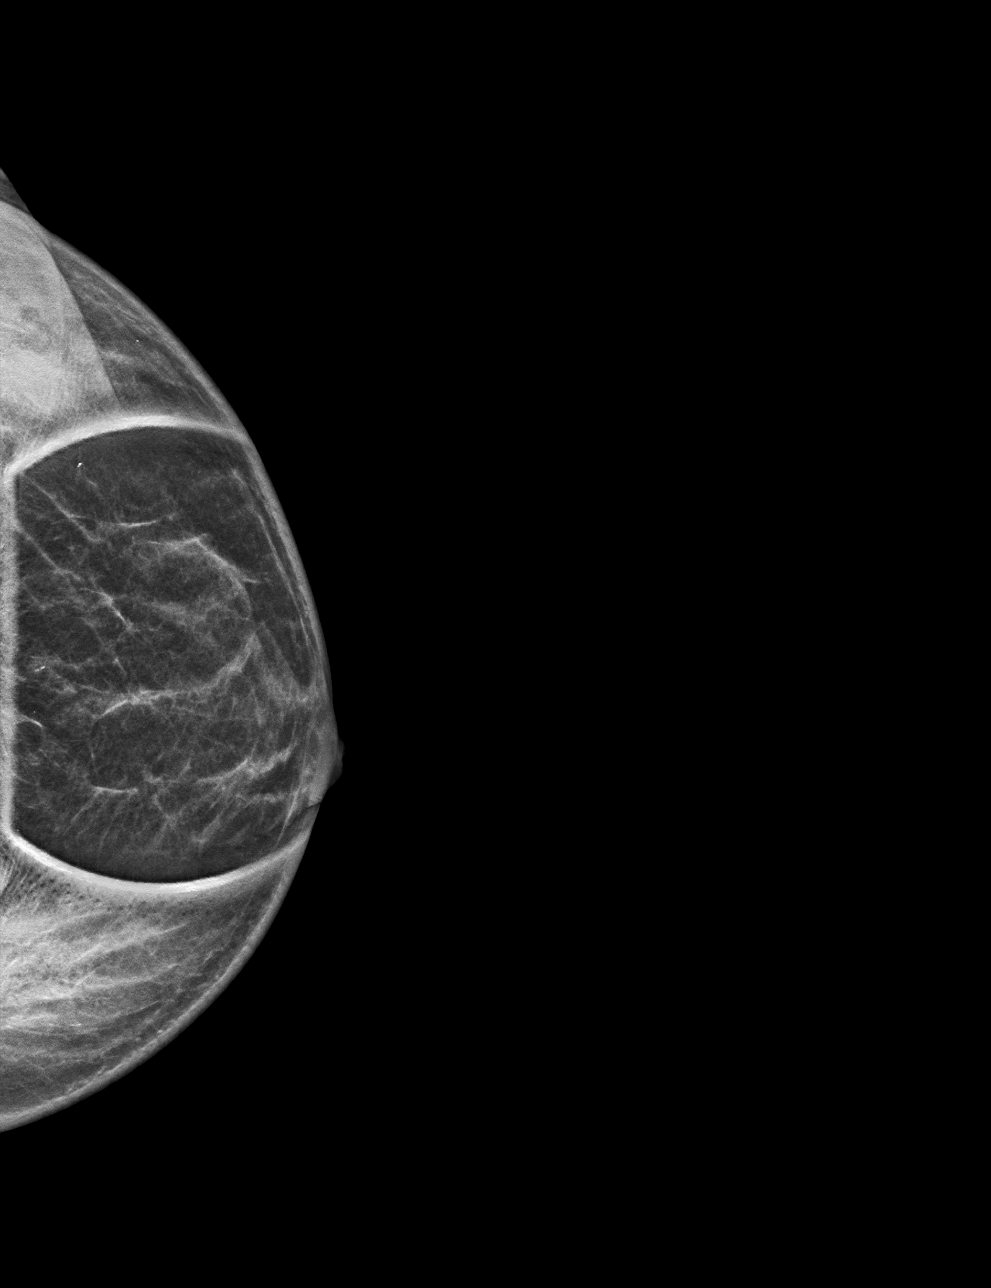

[L MLO synth-2D]
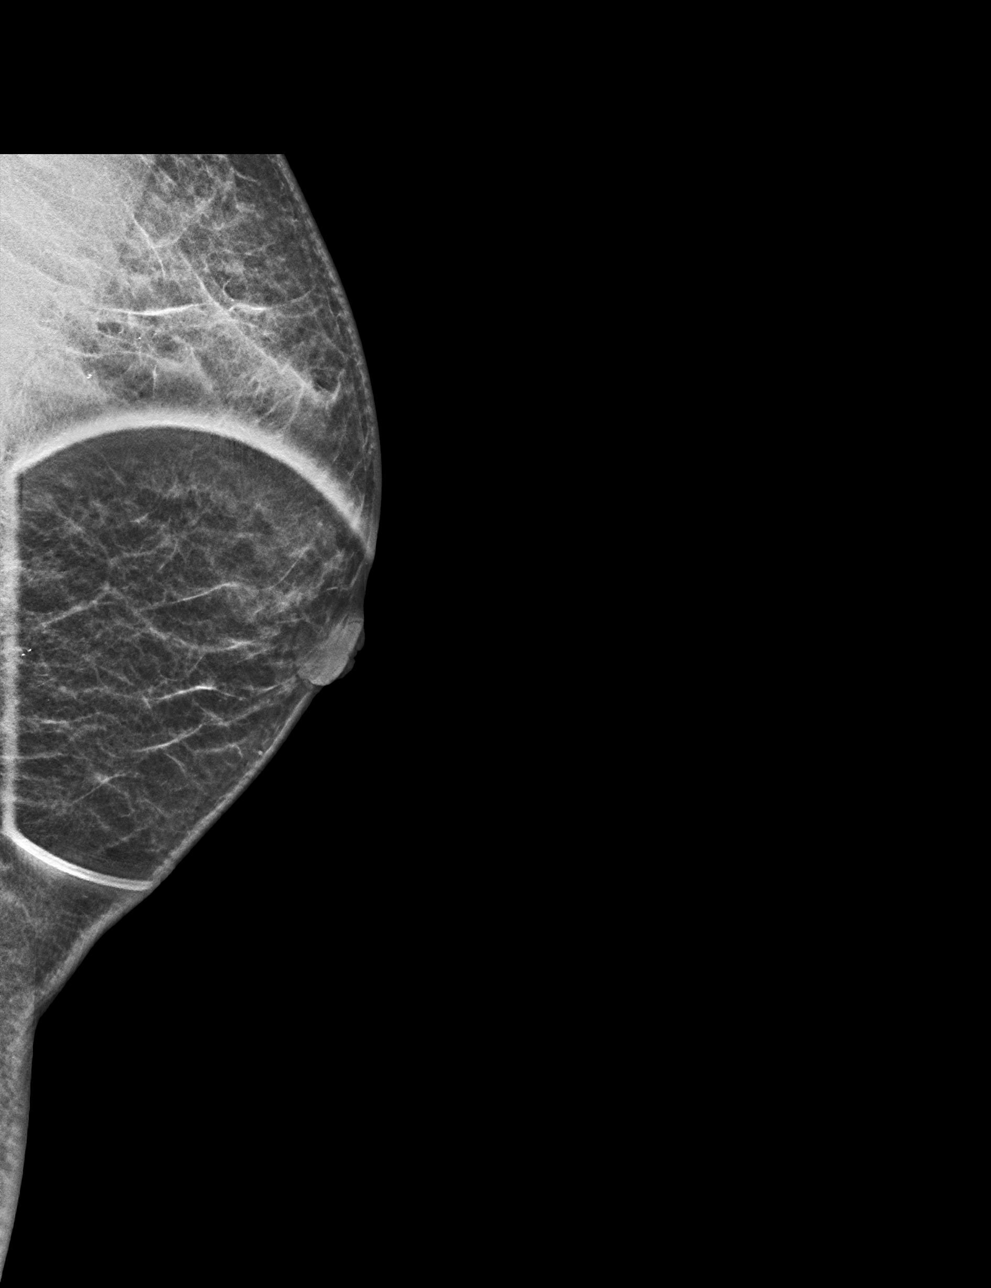

[L CC tomo · tomo slice 17/33.0]
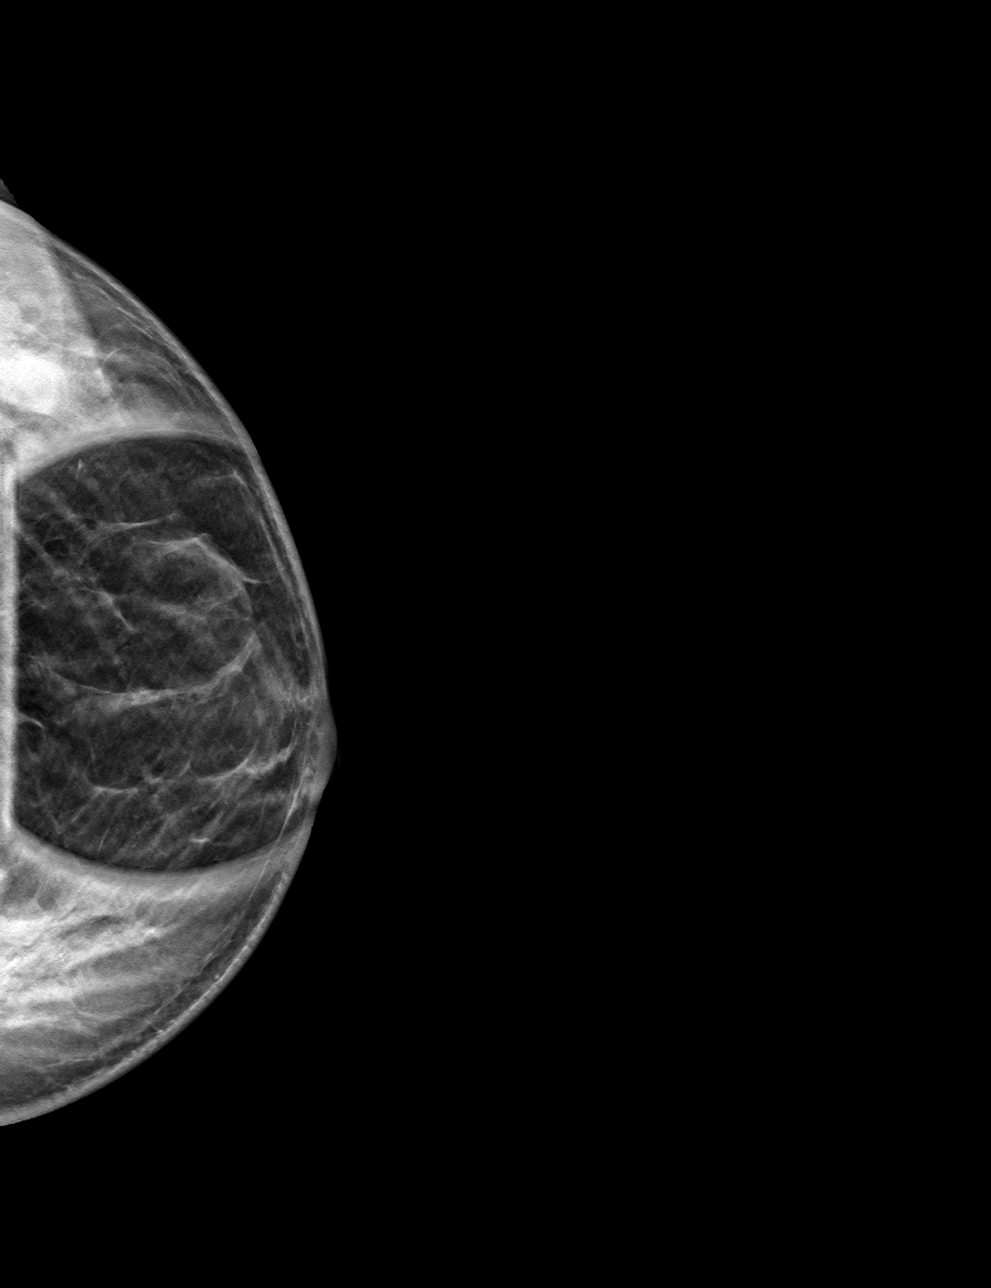

[L MLO tomo · tomo slice 17/32.0]
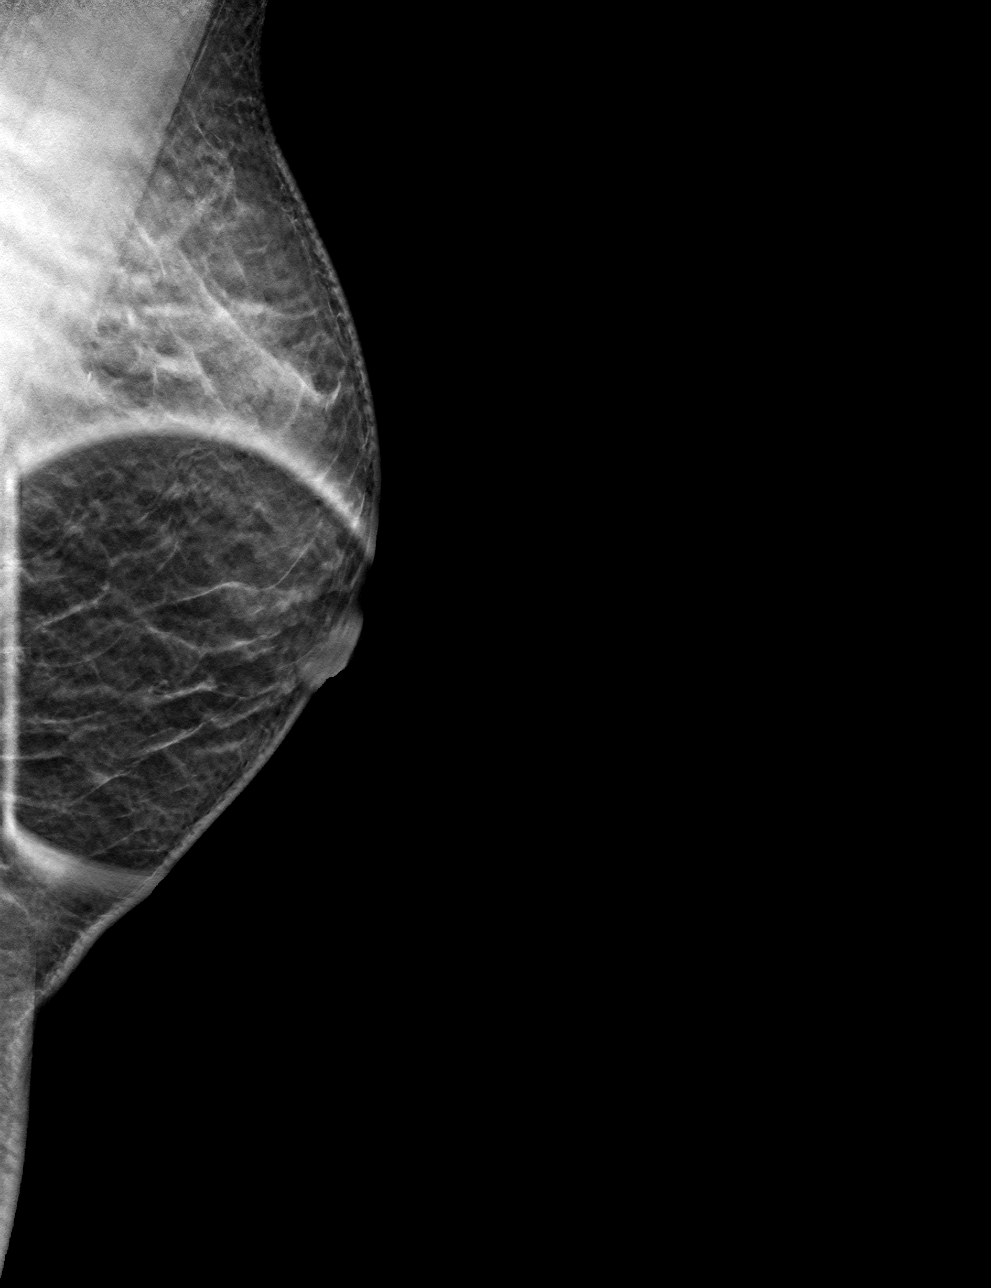

[4 of 12 positions shown; findings below may reference images not displayed]

There are no older exams
available.

ACR Breast Density Category b: There are scattered areas of
fibroglandular density.
FINDINGS: The areas of possible asymmetry disperses on the spot compression
images of the left breast, consistent with superimposed normal
fibroglandular tissue.

There are no masses, areas of architectural distortion or areas of
significant residual asymmetry. No suspicious calcifications.

Mammographic images were processed with CAD.
IMPRESSION: Negative exam.  No evidence of breast malignancy.

RECOMMENDATION:
Screening mammogram in one year.(Code:[3Q])

I have discussed the findings and recommendations with the patient.
If applicable, a reminder letter will be sent to the patient
regarding the next appointment.

BI-RADS CATEGORY  1: Negative.

## 2019-06-14 ENCOUNTER — Other Ambulatory Visit: Payer: Self-pay | Admitting: Family Medicine

## 2019-06-14 DIAGNOSIS — R5381 Other malaise: Secondary | ICD-10-CM

## 2019-06-21 ENCOUNTER — Other Ambulatory Visit: Payer: Self-pay | Admitting: Family Medicine

## 2019-06-21 DIAGNOSIS — N183 Chronic kidney disease, stage 3 unspecified: Secondary | ICD-10-CM

## 2019-06-21 DIAGNOSIS — I10 Essential (primary) hypertension: Secondary | ICD-10-CM

## 2019-06-21 DIAGNOSIS — I517 Cardiomegaly: Secondary | ICD-10-CM

## 2019-06-22 ENCOUNTER — Other Ambulatory Visit: Payer: No Typology Code available for payment source

## 2019-06-23 ENCOUNTER — Ambulatory Visit
Admission: RE | Admit: 2019-06-23 | Discharge: 2019-06-23 | Disposition: A | Discharge status: Home / Self Care | Payer: No Typology Code available for payment source | Source: Ambulatory Visit | Attending: Family Medicine | Admitting: Family Medicine

## 2019-06-23 DIAGNOSIS — I517 Cardiomegaly: Secondary | ICD-10-CM

## 2019-06-23 DIAGNOSIS — I10 Essential (primary) hypertension: Secondary | ICD-10-CM

## 2019-06-23 DIAGNOSIS — R5381 Other malaise: Secondary | ICD-10-CM

## 2019-06-23 DIAGNOSIS — N183 Chronic kidney disease, stage 3 unspecified: Secondary | ICD-10-CM

## 2019-06-23 IMAGING — US US CAROTID DUPLEX BILAT
1 series · 13 of 24 positions shown · non-contrast
Comparison: None.

CLINICAL DATA: 63-year-old female with a history of cardiovascular
risk factors

EXAM:
BILATERAL CAROTID DUPLEX ULTRASOUND
TECHNIQUE: Gray scale imaging, color Doppler and duplex ultrasound were
performed of bilateral carotid and vertebral arteries in the neck.

[Series 1: us carotid duplex bilat · 0.06mm/px · 13 of 65 slices shown]
[im 1/65]
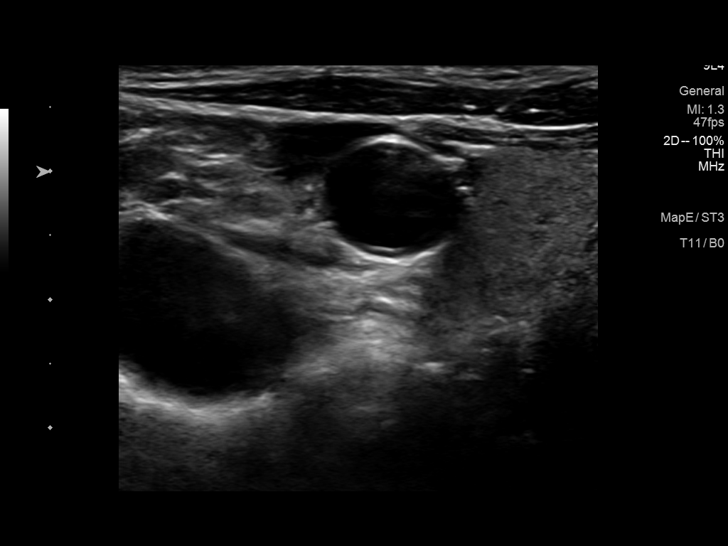
[im 6/65]
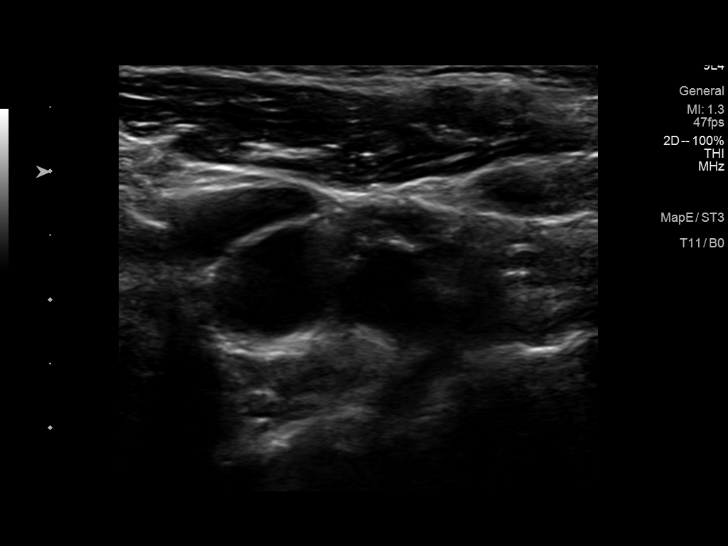
[im 12/65]
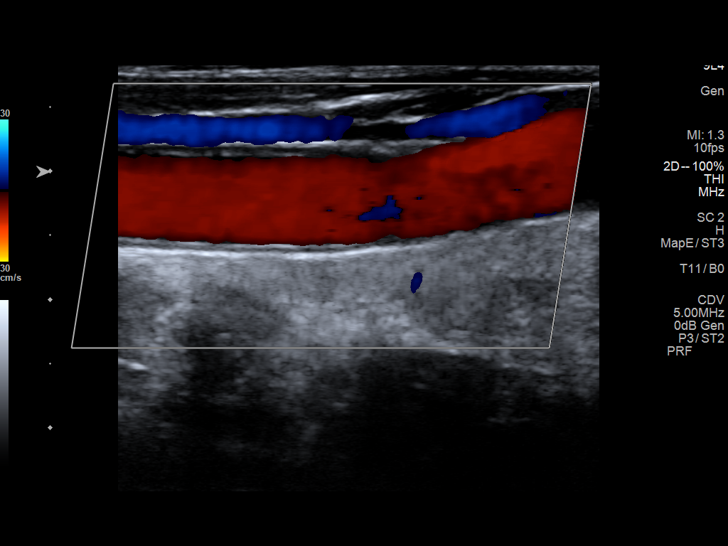
[im 17/65]
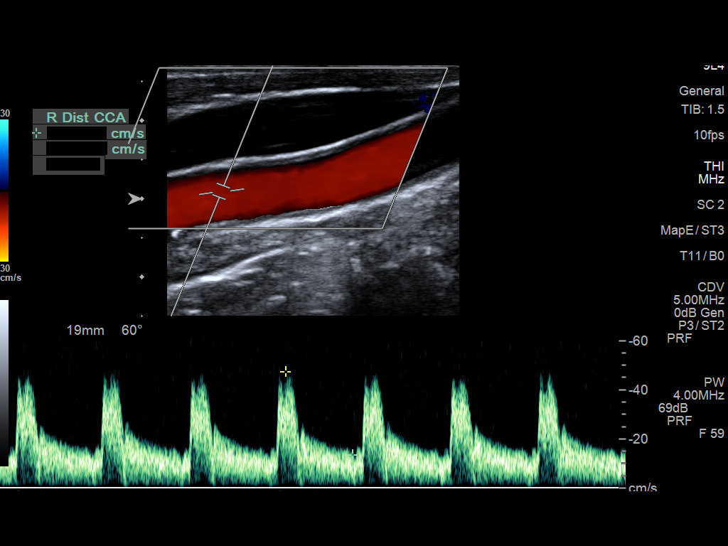
[im 23/65]
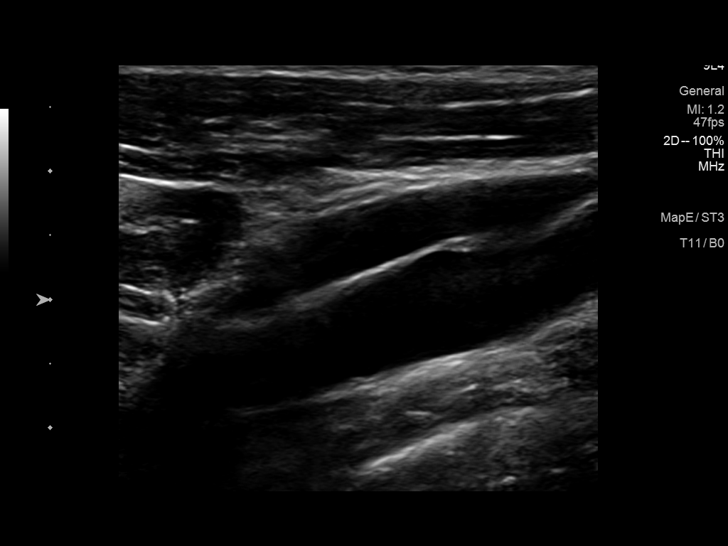
[im 28/65]
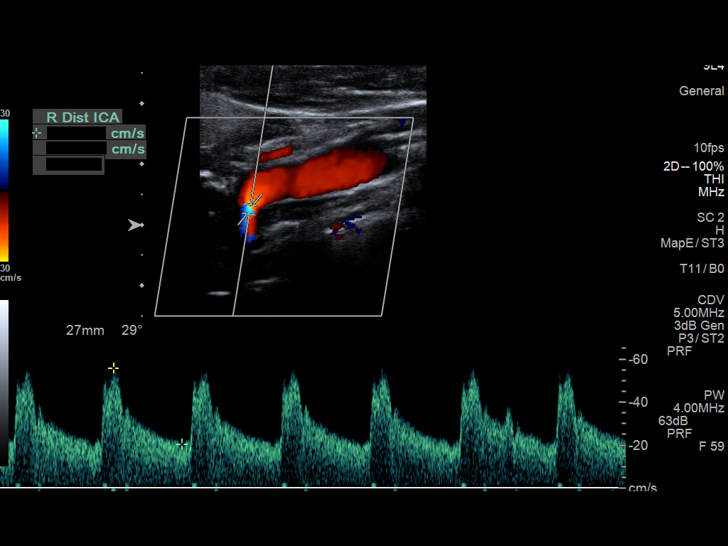
[im 34/65]
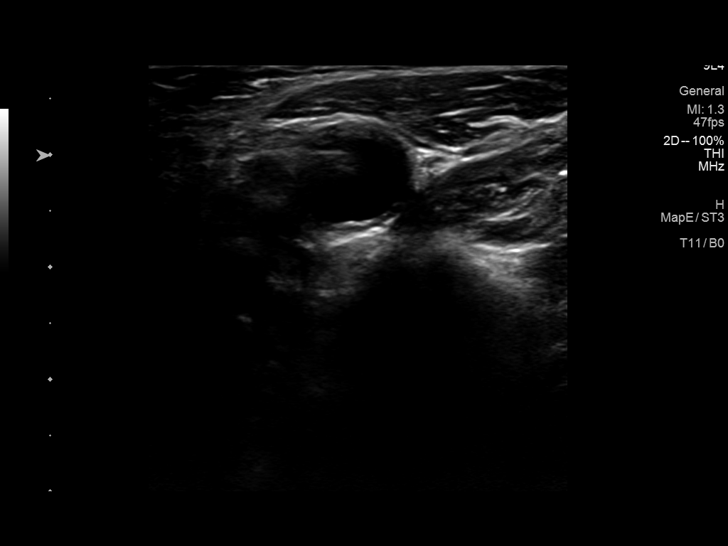
[im 37/65]
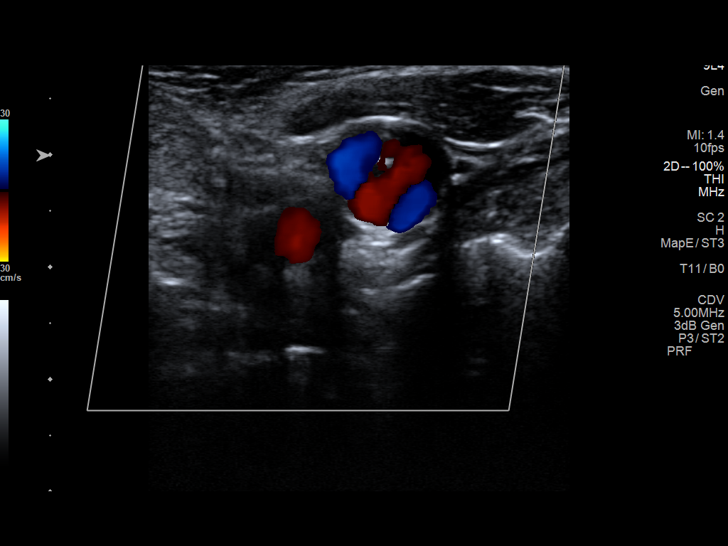
[im 42/65]
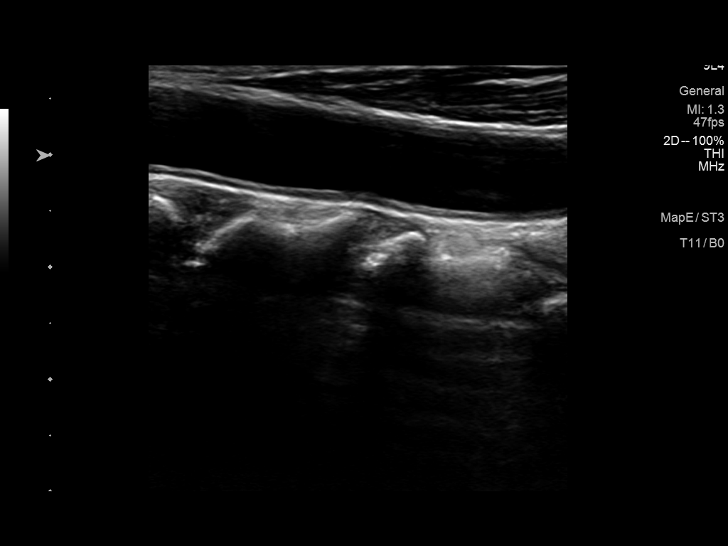
[im 48/65]
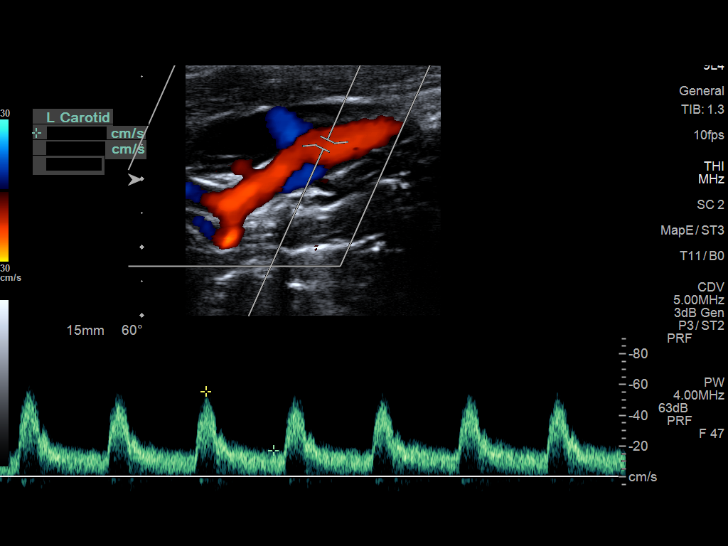
[im 53/65]
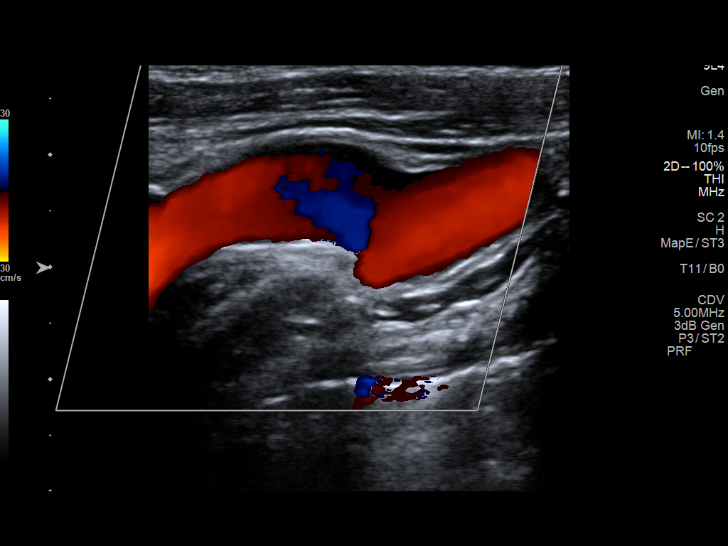
[im 59/65]
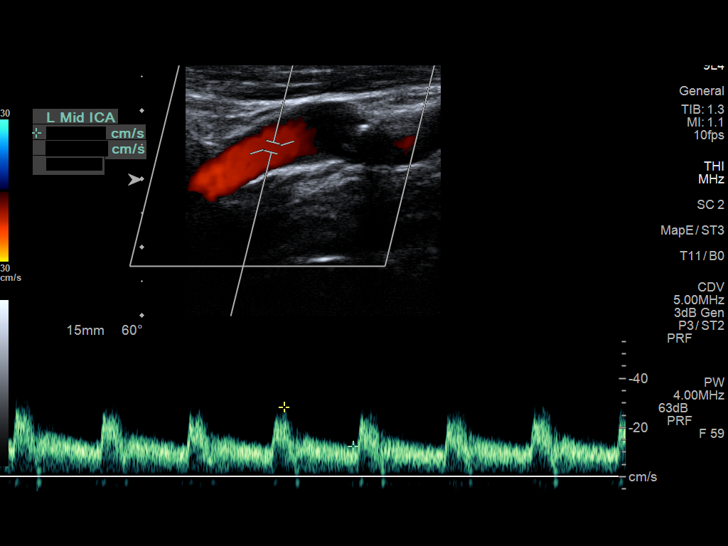
[im 65/65]
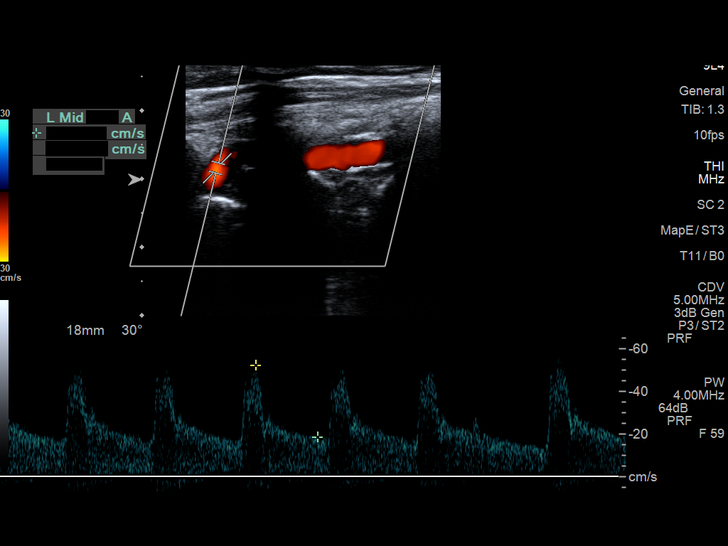

[13 of 24 positions shown; findings below may reference images not displayed]

FINDINGS: Criteria: Quantification of carotid stenosis is based on velocity
parameters that correlate the residual internal carotid diameter
with NASCET-based stenosis levels, using the diameter of the distal
internal carotid lumen as the denominator for stenosis measurement.

The following velocity measurements were obtained:

RIGHT

ICA:  Systolic 56 cm/sec, Diastolic 21 cm/sec

CCA:  51 cm/sec

SYSTOLIC ICA/CCA RATIO:

ECA:  54 cm/sec

LEFT

ICA:  Systolic 50 cm/sec, Diastolic 20 cm/sec

CCA:  57 cm/sec

SYSTOLIC ICA/CCA RATIO:

ECA:  58 cm/sec

Right Brachial SBP: 197

Left Brachial SBP: 188

RIGHT CAROTID ARTERY: No significant calcifications of the right
common carotid artery. Intermediate waveform maintained.
Heterogeneous and partially calcified plaque at the right carotid
bifurcation. No significant lumen shadowing. Low resistance waveform
of the right ICA. No significant tortuosity.

RIGHT VERTEBRAL ARTERY: Antegrade flow with low resistance waveform.

LEFT CAROTID ARTERY: No significant calcifications of the left
common carotid artery. Intermediate waveform maintained.
Heterogeneous and partially calcified plaque at the left carotid
bifurcation without significant lumen shadowing. Low resistance
waveform of the left ICA. No significant tortuosity.

LEFT VERTEBRAL ARTERY:  Antegrade flow with low resistance waveform.
IMPRESSION: Color duplex indicates minimal heterogeneous and calcified plaque,
with no hemodynamically significant stenosis by duplex criteria in
the extracranial cerebrovascular circulation.

## 2019-06-30 ENCOUNTER — Ambulatory Visit: Payer: Self-pay

## 2019-07-07 ENCOUNTER — Other Ambulatory Visit: Payer: Self-pay

## 2019-07-07 ENCOUNTER — Ambulatory Visit (INDEPENDENT_AMBULATORY_CARE_PROVIDER_SITE_OTHER): Payer: No Typology Code available for payment source | Admitting: Cardiology

## 2019-07-07 ENCOUNTER — Encounter (INDEPENDENT_AMBULATORY_CARE_PROVIDER_SITE_OTHER): Payer: Self-pay

## 2019-07-07 VITALS — BP 180/120 | HR 64 | Ht 64.0 in | Wt 102.4 lb

## 2019-07-07 DIAGNOSIS — R9431 Abnormal electrocardiogram [ECG] [EKG]: Secondary | ICD-10-CM | POA: Diagnosis not present

## 2019-07-07 DIAGNOSIS — I10 Essential (primary) hypertension: Secondary | ICD-10-CM

## 2019-07-07 DIAGNOSIS — Z79899 Other long term (current) drug therapy: Secondary | ICD-10-CM | POA: Diagnosis not present

## 2019-07-07 MED ORDER — SPIRONOLACTONE 25 MG PO TABS: 25.0000 mg | ORAL_TABLET | Freq: Every day | ORAL | 6 refills | Status: DC

## 2019-07-07 NOTE — Progress Notes (Signed)
Cardiology Office Note:    Date:  07/07/2019   ID:  Rennis Chris Leianne, Callins December 26, 1955, MRN [id number]  PCP:  Daisy Floro, MD  Cardiologist:  No primary care provider on file.  Electrophysiologist:  None   Referring MD: Daisy Floro, MD     History of Present Illness:    Kendra Richardson is a 64 y.o. female here for the evaluation of difficult to control hypertension at the request of Dr. Tenny Craw.  Blood pressure today 180/120 on amlodipine clonidine patch Bystolic 20 and quinapril 40.  In the past states that HCTZ made her lightheaded.  Overall it has been years since she has had high blood pressure.  Last year at some point she is woken up at night gasping for breath knowing that this is from her blood pressure.  This has not happened in quite some time.  Sometimes she may feel some subtle chest tightness when she is stressed but this is quite rare.  No syncope bleeding orthopnea PND.  States that she does not like to take medications.  Would prefer patches if possible.  She is taking clonidine patch for instance.    Past Medical History:  Diagnosis Date  . Abnormal radiographic examination   . CHF (congestive heart failure) (HCC)   . Chronic kidney disease, stage III (moderate)   . Chronic kidney disease, stage III (moderate)   . CN (constipation)   . Essential hypertension, malignant   . HTN (hypertension)   . LVH (left ventricular hypertrophy)   . Protein-calorie malnutrition (HCC)     Past Surgical History:  Procedure Laterality Date  . BREAST EXCISIONAL BIOPSY Left     Current Medications: Current Meds  Medication Sig  . amLODipine (NORVASC) 10 MG tablet Take 10 mg by mouth daily.  Marland Kitchen aspirin EC 81 MG tablet Take 81 mg by mouth daily.  . cloNIDine (CATAPRES - DOSED IN MG/24 HR) 0.2 mg/24hr patch Place 0.2 mg onto the skin once a week.  . Multiple Vitamin (MULTIVITAMIN) tablet Take 1 tablet by mouth daily.  . Nebivolol HCl  (BYSTOLIC) 20 MG TABS Take by mouth.  . quinapril (ACCUPRIL) 40 MG tablet Take 40 mg by mouth at bedtime.     Allergies:   Patient has no allergy information on record.   Social History   Socioeconomic History  . Marital status: Married    Spouse name: Not on file  . Number of children: Not on file  . Years of education: Not on file  . Highest education level: Not on file  Occupational History  . Not on file  Tobacco Use  . Smoking status: Not on file  Substance and Sexual Activity  . Alcohol use: Not on file  . Drug use: Not on file  . Sexual activity: Not on file  Other Topics Concern  . Not on file  Social History Narrative  . Not on file   Social Determinants of Health   Financial Resource Strain:   . Difficulty of Paying Living Expenses: Not on file  Food Insecurity:   . Worried About Programme researcher, broadcasting/film/video in the Last Year: Not on file  . Ran Out of Food in the Last Year: Not on file  Transportation Needs:   . Lack of Transportation (Medical): Not on file  . Lack of Transportation (Non-Medical): Not on file  Physical Activity:   . Days of Exercise per Week: Not on file  . Minutes of Exercise per Session: Not  on file  Stress:   . Feeling of Stress : Not on file  Social Connections:   . Frequency of Communication with Friends and Family: Not on file  . Frequency of Social Gatherings with Friends and Family: Not on file  . Attends Religious Services: Not on file  . Active Member of Clubs or Organizations: Not on file  . Attends Banker Meetings: Not on file  . Marital Status: Not on file     Family History: The patient's family history includes Breast cancer in her maternal aunt and mother.  Sister died age 39 from hypertensive heart disease  ROS:   Please see the history of present illness.     All other systems reviewed and are negative.  EKGs/Labs/Other Studies Reviewed:    The following studies were reviewed today:  ECHO 2010:  SUMMARY   - Overall left ventricular systolic function was mildly decreased.     Left ventricular ejection fraction was estimated , range     being 45 % to 50 %. There was mild diffuse left ventricular     hypokinesis.  - The left atrium was mild to moderately dilated.   EKG:  EKG is  ordered today.  The ekg ordered today demonstrates sinus rhythm 64 with T wave inversion noted in V5 V6 possible repolarization abnormality with LVH.  Recent Labs: No results found for requested labs within last 8760 hours.  Recent Lipid Panel No results found for: CHOL, TRIG, HDL, CHOLHDL, VLDL, LDLCALC, LDLDIRECT  Physical Exam:    VS:  BP (!) 180/120   Pulse 64   Ht 5\' 4"  (1.626 m)   Wt 102 lb 6.4 oz (46.4 kg)   SpO2 96%   BMI 17.58 kg/m     Wt Readings from Last 3 Encounters:  07/07/19 102 lb 6.4 oz (46.4 kg)     GEN:  Thin in no acute distress HEENT: Normal NECK: No JVD; No carotid bruits LYMPHATICS: No lymphadenopathy CARDIAC: RRR, no murmurs, rubs, +S4 gallop RESPIRATORY:  Clear to auscultation without rales, wheezing or rhonchi  ABDOMEN: Soft, non-tender, non-distended MUSCULOSKELETAL:  No edema; No deformity  SKIN: Warm and dry NEUROLOGIC:  Alert and oriented x 3 PSYCHIATRIC:  Normal affect   ASSESSMENT:    1. Uncontrolled hypertension   2. Nonspecific abnormal electrocardiogram (ECG) (EKG)   3. Medication management    PLAN:    In order of problems listed above:  Uncontrolled hypertension -Multidrug regimen, difficult. -We will check renal duplex -Check TSH free T4 -Check urinalysis for any evidence of protein in urine -From outside labs, hemoglobin 10.9 platelets 202 creatinine 1.17 BUN 26, sodium 142 potassium 4.5 albumin 4.4 ALT 12. -Start spironolactone 25 mg once a day.  Have her come back in 1 week for basic metabolic profile and see hypertension clinic.  Abnormal EKG -T wave inversion noted V5 V6 could be repolarization abnormality, we will check an  echocardiogram.  In 2010 her EF was 45%.  Could be hypertensive related.  In the future if this remains low, and she continues to have some atypical chest discomfort, we could definitely consider a coronary CT scan for further evaluation.  Protein calorie malnutrition -Continue encourage caloric intake.  CKD 3 -Continue to monitor creatinine.  Former tobacco use -Quit approximately 4 weeks ago.  For over 19 years she smoked 4 to 5 cigarettes a day. Medication Adjustments/Labs and Tests Ordered: Current medicines are reviewed at length with the patient today.  Concerns regarding medicines  are outlined above.  Orders Placed This Encounter  Procedures  . T4, free  . TSH  . Urinalysis  . EKG 12-Lead  . ECHOCARDIOGRAM COMPLETE  . VAS US RENAL ARTERY DUPLEX   Meds ordered this encounter  Medications  . spironolactone (ALDACTONE) 25 MG tablet    Sig: Take 1 tablet (25 mg total) by mouth daily.    Dispense:  30 tablet    Refill:  6    Patient Instructions  Medication Instructions:  Please start Spironolactone 25 mg daily.  Continue all other medications as listed.  *If you need a refill on your cardiac medications before your next appointment, please call your pharmacy*  Lab Work: Please have blood work today (TSH, Free T4, U/A)  If you have labs (blood work) drawn today and your tests are completely normal, you will receive your results only by: Marland Kitchen MyChart Message (if you have MyChart) OR . A paper copy in the mail If you have any lab test that is abnormal or we need to change your treatment, we will call you to review the results.  Testing/Procedures: Your physician has requested that you have an echocardiogram. Echocardiography is a painless test that uses sound waves to create images of your heart. It provides your doctor with information about the size and shape of your heart and how well your heart's chambers and valves are working. This procedure takes approximately one  hour. There are no restrictions for this procedure.  Your physician has requested that you have a renal artery duplex. During this test, an ultrasound is used to evaluate blood flow to the kidneys. Allow one hour for this exam. Do not eat after midnight the day before and avoid carbonated beverages. Take your medications as you usually do.  Follow-Up: Follow up in 1 to 2 weeks in the Hypertension Clinic.  Thank you for choosing [facility]!!     Echocardiogram An echocardiogram is a procedure that uses painless sound waves (ultrasound) to produce an image of the heart. Images from an echocardiogram can provide important information about:  Signs of coronary artery disease (CAD).  Aneurysm detection. An aneurysm is a weak or damaged part of an artery wall that bulges out from the normal force of blood pumping through the body.  Heart size and shape. Changes in the size or shape of the heart can be associated with certain conditions, including heart failure, aneurysm, and CAD.  Heart muscle function.  Heart valve function.  Signs of a past heart attack.  Fluid buildup around the heart.  Thickening of the heart muscle.  A tumor or infectious growth around the heart valves. Tell a health care provider about:  Any allergies you have.  All medicines you are taking, including vitamins, herbs, eye drops, creams, and over-the-counter medicines.  Any blood disorders you have.  Any surgeries you have had.  Any medical conditions you have.  Whether you are pregnant or may be pregnant. What are the risks? Generally, this is a safe procedure. However, problems may occur, including:  Allergic reaction to dye (contrast) that may be used during the procedure. What happens before the procedure? No specific preparation is needed. You may eat and drink normally. What happens during the procedure?   An IV tube may be inserted into one of your veins.  You may receive  contrast through this tube. A contrast is an injection that improves the quality of the pictures from your heart.  A gel will be applied  to your chest.  A wand-like tool (transducer) will be moved over your chest. The gel will help to transmit the sound waves from the transducer.  The sound waves will harmlessly bounce off of your heart to allow the heart images to be captured in real-time motion. The images will be recorded on a computer. The procedure may vary among health care providers and hospitals. What happens after the procedure?  You may return to your normal, everyday life, including diet, activities, and medicines, unless your health care provider tells you not to do that. Summary  An echocardiogram is a procedure that uses painless sound waves (ultrasound) to produce an image of the heart.  Images from an echocardiogram can provide important information about the size and shape of your heart, heart muscle function, heart valve function, and fluid buildup around your heart.  You do not need to do anything to prepare before this procedure. You may eat and drink normally.  After the echocardiogram is completed, you may return to your normal, everyday life, unless your health care provider tells you not to do that. This information is not intended to replace advice given to you by your health care provider. Make sure you discuss any questions you have with your health care provider. Document Revised: 10/01/2018 Document Reviewed: 07/13/2016 Elsevier Patient Education  [place].       Signed, Candee Furbish, MD  07/07/2019 5:03 PM    [place]

## 2019-07-07 NOTE — Patient Instructions (Signed)
Medication Instructions:  Please start Spironolactone 25 mg daily.  Continue all other medications as listed.  *If you need a refill on your cardiac medications before your next appointment, please call your pharmacy*  Lab Work: Please have blood work today (TSH, Free T4, U/A)  If you have labs (blood work) drawn today and your tests are completely normal, you will receive your results only by: Marland Kitchen MyChart Message (if you have MyChart) OR . A paper copy in the mail If you have any lab test that is abnormal or we need to change your treatment, we will call you to review the results.  Testing/Procedures: Your physician has requested that you have an echocardiogram. Echocardiography is a painless test that uses sound waves to create images of your heart. It provides your doctor with information about the size and shape of your heart and how well your heart's chambers and valves are working. This procedure takes approximately one hour. There are no restrictions for this procedure.  Your physician has requested that you have a renal artery duplex. During this test, an ultrasound is used to evaluate blood flow to the kidneys. Allow one hour for this exam. Do not eat after midnight the day before and avoid carbonated beverages. Take your medications as you usually do.  Follow-Up: Follow up in 1 to 2 weeks in the Hypertension Clinic.  Thank you for choosing [facility]!!     Echocardiogram An echocardiogram is a procedure that uses painless sound waves (ultrasound) to produce an image of the heart. Images from an echocardiogram can provide important information about:  Signs of coronary artery disease (CAD).  Aneurysm detection. An aneurysm is a weak or damaged part of an artery wall that bulges out from the normal force of blood pumping through the body.  Heart size and shape. Changes in the size or shape of the heart can be associated with certain conditions, including heart  failure, aneurysm, and CAD.  Heart muscle function.  Heart valve function.  Signs of a past heart attack.  Fluid buildup around the heart.  Thickening of the heart muscle.  A tumor or infectious growth around the heart valves. Tell a health care provider about:  Any allergies you have.  All medicines you are taking, including vitamins, herbs, eye drops, creams, and over-the-counter medicines.  Any blood disorders you have.  Any surgeries you have had.  Any medical conditions you have.  Whether you are pregnant or may be pregnant. What are the risks? Generally, this is a safe procedure. However, problems may occur, including:  Allergic reaction to dye (contrast) that may be used during the procedure. What happens before the procedure? No specific preparation is needed. You may eat and drink normally. What happens during the procedure?   An IV tube may be inserted into one of your veins.  You may receive contrast through this tube. A contrast is an injection that improves the quality of the pictures from your heart.  A gel will be applied to your chest.  A wand-like tool (transducer) will be moved over your chest. The gel will help to transmit the sound waves from the transducer.  The sound waves will harmlessly bounce off of your heart to allow the heart images to be captured in real-time motion. The images will be recorded on a computer. The procedure may vary among health care providers and hospitals. What happens after the procedure?  You may return to your normal, everyday life, including diet, activities,  and medicines, unless your health care provider tells you not to do that. Summary  An echocardiogram is a procedure that uses painless sound waves (ultrasound) to produce an image of the heart.  Images from an echocardiogram can provide important information about the size and shape of your heart, heart muscle function, heart valve function, and fluid buildup  around your heart.  You do not need to do anything to prepare before this procedure. You may eat and drink normally.  After the echocardiogram is completed, you may return to your normal, everyday life, unless your health care provider tells you not to do that. This information is not intended to replace advice given to you by your health care provider. Make sure you discuss any questions you have with your health care provider. Document Revised: 10/01/2018 Document Reviewed: 07/13/2016 Elsevier Patient Education  2020 [employer].

## 2019-07-08 LAB — T4, FREE: Free T4: 1.52 ng/dL (ref 0.82–1.77)

## 2019-07-08 LAB — TSH: TSH: 0.516 u[IU]/mL (ref 0.450–4.500)

## 2019-07-21 NOTE — Progress Notes (Signed)
Patient ID: Kendra Richardson                 DOB: 10-Aug-1955                      MRN: [id number]     HPI: Kendra Richardson is a 64 y.o. female referred by Dr. Marlou Porch to HTN clinic. PMH is significant for uncontrolled HTN, CHF (EF 45-50%)- 2010, CKD stage 3, and left ventricular hypertrophy.  Patient was last seen by Dr. Marlou Porch on 07/07/19. Reported that BP has been high for many years. Last year she used to wake up at night gasping for air knowing this is due to her BP, however this has not happened in a while. Patient reports subtle chest tightness when she is stressed, but this is rarely happens. The patient states she does not like to take medications.  BP in clinic was 180/120 and the patient was started on spironolactone 25 mg once daily. EKG on 07/07/19 showed sinus rhythm 64 with T wave inversion noted in V5 V6 possible repolarization abnormality with LVH  Patient arrives in HTN clinic for medication management. The patient is doing well and states she is taking all medications prescribed. She did report experiencing constipation with daily multivitamin. She denies swelling and dizziness. She says she has a headache but attributes that to not eating or drinking anything yet today. The patient said she does use ibuprofen for occasional headaches. She does not exercise, but eats a healthy diet due to trying to gain ~20 lbs per doctor's request. She reports she recently stopped smoking and is doing well with no cravings. She reports not checking her BP at home often because she knows it is high, but when she does check her BP she is usually standing up. BP in clinic was 172/105 with HR 61.   Current HTN meds: amlodipine 10 mg daily, quinapril 40 mg QPM, Bystolic 20 mg daily, clonidine 0.2 mg patch once a week, spironolactone 25 mg daily Previously tried: HCTZ (lightheaded) BP goal: < 130/80 mmHg  Family History: Father and sister- heart disease and HTN. Mother- DM, HTN and  breast cancer. Youngest brother and sister- HTN. Aunt- lung cancer. Another aunt- HTN. She states HTN is common on both sides of the family.  Social History: Stopped cigarette smoking 5 weeks ago. One 8 oz beer once every 3-4 weeks. Previous drug use.  Diet: Eats a lot of vegetables, fruits and fiber in diet. Fruits: apples, grapes, oranges, and berries. Likes to eat fruits with ice cream. Does not cook with salt or add salt. Raisin Bran or Cheerios w/ an apple for breakfast. Salads w/ chicken or fish. Ginger ale twice a week. Drinks Welch's grape juice w/ fiber, green tea, and plenty of water.  Exercise: None- doctor wants her to gain ~20 lbs, so she is afraid she will lose weight  Home BP readings: None  Wt Readings from Last 3 Encounters:  07/07/19 102 lb 6.4 oz (46.4 kg)   BP Readings from Last 3 Encounters:  07/07/19 (!) 180/120   Pulse Readings from Last 3 Encounters:  07/07/19 64    Renal function: CrCl cannot be calculated (Patient's most recent lab result is older than the maximum 21 days allowed.).  Past Medical History:  Diagnosis Date  . Abnormal radiographic examination   . CHF (congestive heart failure) ([place])   . Chronic kidney disease, stage III (moderate)   . Chronic kidney disease, stage III (moderate)   .  CN (constipation)   . Essential hypertension, malignant   . HTN (hypertension)   . LVH (left ventricular hypertrophy)   . Protein-calorie malnutrition (HCC)     Current Outpatient Medications on File Prior to Visit  Medication Sig Dispense Refill  . amLODipine (NORVASC) 10 MG tablet Take 10 mg by mouth daily.    Marland Kitchen aspirin EC 81 MG tablet Take 81 mg by mouth daily.    . cloNIDine (CATAPRES - DOSED IN MG/24 HR) 0.2 mg/24hr patch Place 0.2 mg onto the skin once a week.    . Multiple Vitamin (MULTIVITAMIN) tablet Take 1 tablet by mouth daily.    . Nebivolol HCl (BYSTOLIC) 20 MG TABS Take by mouth.    . quinapril (ACCUPRIL) 40 MG tablet Take 40 mg by mouth at  bedtime.    Marland Kitchen spironolactone (ALDACTONE) 25 MG tablet Take 1 tablet (25 mg total) by mouth daily. 30 tablet 6   No current facility-administered medications on file prior to visit.    Not on File   Assessment/Plan:  1. Hypertension - BP remains above goal of < 130/80 mmHg. Continue taking amlodipine 10 mg daily, quinapril 40 mg daily, Bystolic 20 mg daily, clonidine 0.2 mg patch once a week, and spironolactone 25 mg daily. Will get BMET today and will call patient tomorrow with results and possibly increase spironolactone to 50 mg daily. Discussed proper technique for checking blood pressure and encouraged patient to check BP once a day. We discussed doing strength training and increasing protein and carb intake to help with gaining weight. Advised the patient to take multivitamin every other day to avoid constipation and to take Tylenol instead of ibuprofen when she gets a headache. Will follow-up in clinic in 2 weeks.  Addendum: Patients Echo report came back after visit showing a reduced EF of 30-35%. Will await BMP results then make the switch from quinapril to [facility] 49-51 twice a day. Will call the patient tomorrow with instructions on how to change for ACEi to [facility]. Will hold off on spironolactone increase for now as we will want to maximize Entresto dose first.  Kendra Richardson, PharmD Candidate  Kendra Richardson, Pharm.D, BCPS, CPP [facility]  [address], [place], [place] [address]  Phone: [phone]; Fax: [phone]

## 2019-07-22 ENCOUNTER — Inpatient Hospital Stay (HOSPITAL_COMMUNITY): Admission: RE | Admit: 2019-07-22 | Payer: No Typology Code available for payment source | Source: Ambulatory Visit

## 2019-07-22 ENCOUNTER — Encounter: Payer: Self-pay | Admitting: Pharmacist

## 2019-07-22 ENCOUNTER — Ambulatory Visit (INDEPENDENT_AMBULATORY_CARE_PROVIDER_SITE_OTHER): Payer: No Typology Code available for payment source | Admitting: Pharmacist

## 2019-07-22 ENCOUNTER — Ambulatory Visit (HOSPITAL_BASED_OUTPATIENT_CLINIC_OR_DEPARTMENT_OTHER): Payer: No Typology Code available for payment source

## 2019-07-22 ENCOUNTER — Other Ambulatory Visit: Payer: Self-pay

## 2019-07-22 ENCOUNTER — Ambulatory Visit: Payer: No Typology Code available for payment source

## 2019-07-22 VITALS — BP 172/105 | HR 61

## 2019-07-22 DIAGNOSIS — I10 Essential (primary) hypertension: Secondary | ICD-10-CM

## 2019-07-22 DIAGNOSIS — R9431 Abnormal electrocardiogram [ECG] [EKG]: Secondary | ICD-10-CM | POA: Diagnosis not present

## 2019-07-22 LAB — BASIC METABOLIC PANEL
BUN/Creatinine Ratio: 29 — ABNORMAL HIGH (ref 12–28)
BUN: 45 mg/dL — ABNORMAL HIGH (ref 8–27)
CO2: 21 mmol/L (ref 20–29)
Calcium: 9.9 mg/dL (ref 8.7–10.3)
Chloride: 105 mmol/L (ref 96–106)
Creatinine, Ser: 1.57 mg/dL — ABNORMAL HIGH (ref 0.57–1.00)
GFR calc Af Amer: 40 mL/min/{1.73_m2} — ABNORMAL LOW (ref >59)
GFR calc non Af Amer: 34 mL/min/{1.73_m2} — ABNORMAL LOW (ref >59)
Glucose: 101 mg/dL — ABNORMAL HIGH (ref 65–99)
Potassium: 5 mmol/L (ref 3.5–5.2)
Sodium: 141 mmol/L (ref 134–144)

## 2019-07-22 NOTE — Patient Instructions (Addendum)
It was nice seeing you today!!  No medications changes today.   Continue taking amlodipine 10 mg daily, quinapril 40 mg daily, Bystolic 20 mg daily, clonidine 0.2 mg patch once a week, and spironolactone 25 mg daily.  Please check you blood pressure once daily in the afternoon about 2 hours after taking your medications. Make sure that you are sitting down with both feet flat on the floor, have rested for about 10 minutes, no caffeine in the past 2 hours, and have used the restroom.  If you have a headache, try to use Tylenol instead of ibuprofen.  We will call you tomorrow about the results of your lab work and possibly increase the dose of spironolactone.   Please call [phone] if you have any questions.  Before checking your blood pressure make sure: You are seated and quite for 5 min before checking Feet are flat on the floor Siting in chair with your back supported straight up and down Arm resting on table or arm of chair at heart level Bladder is empty You have NOT had caffeine or tobacco within the last 30 min  Check your blood pressure 2-3 times about 1-2 min apart. Usually the first reading will be the highest. Record these readings.  Only check your blood pressure once a day, unless otherwise directed Record your blood pressure readings and bring them to all your appointments. If your meter stores your readings in its memory, then you may bring your blood pressure meter with you to your appointments.  You can find a list of validated (accurate) blood pressure cuffs at [employer]  Lifestyle changes can make a world of difference and are even more important than medications: Try to keep your sodium intake to 2300 mg of sodium per day Get 6-8 uninterrupted hours of sleep per night Aim for a goal of 150 min of moderate aerobic exercise (ie brisk walking, bike riding) per week

## 2019-07-23 ENCOUNTER — Telehealth: Payer: Self-pay | Admitting: Pharmacist

## 2019-07-23 ENCOUNTER — Ambulatory Visit (HOSPITAL_COMMUNITY)
Admission: RE | Admit: 2019-07-23 | Discharge: 2019-07-23 | Disposition: A | Discharge status: Home / Self Care | Payer: No Typology Code available for payment source | Source: Ambulatory Visit | Attending: Internal Medicine | Admitting: Internal Medicine

## 2019-07-23 DIAGNOSIS — I10 Essential (primary) hypertension: Secondary | ICD-10-CM | POA: Insufficient documentation

## 2019-07-23 DIAGNOSIS — R9431 Abnormal electrocardiogram [ECG] [EKG]: Secondary | ICD-10-CM | POA: Insufficient documentation

## 2019-07-23 MED ORDER — ENTRESTO 49-51 MG PO TABS: 1.0000 | ORAL_TABLET | Freq: Two times a day (BID) | ORAL | 11 refills | Status: DC

## 2019-07-23 NOTE — Telephone Encounter (Signed)
Agree thank you Donato Schultz, MD

## 2019-07-23 NOTE — Telephone Encounter (Signed)
Spoke with patient. I advised her to decrease her spironolactone to 1/2 tablet daily. If she is unable to successfully cut in half, she may stop taking. She was also advised to stop taking quinapril. Last dose today around noon. She is to start [facility] Sunday evening (36hr washout). PA for [facility] approved. I have faxed over copay card for first month free. Will get patient copay card for $10/month when she follow up in clinic in 2 weeks. Will do BMP in 1 week.  Briefly reviewed with patient that echo showed a reduction in her EF (pumping) and Kendra Richardson will help her heart work more efficiently.

## 2019-07-26 ENCOUNTER — Telehealth: Payer: Self-pay

## 2019-07-26 NOTE — Telephone Encounter (Signed)
**Note De-Identified Kalem Rockwell Obfuscation** Following message received from covermymeds:  [facility] Key: [id number] - Rx #: [id number] Outcome  Your PA has been resolved, no additional PA is required.  DrugEntresto 49-51MG  tablets  FormGEHA Electronic PA Form   I did notify [facility] that per this mess a PA is no longer required for the pts Entresto

## 2019-07-26 NOTE — Telephone Encounter (Signed)
**Note De-Identified Kendra Richardson Obfuscation** I started an [place] PA through covermymeds. Key: [id number]

## 2019-07-30 ENCOUNTER — Other Ambulatory Visit: Payer: No Typology Code available for payment source | Admitting: *Deleted

## 2019-07-30 ENCOUNTER — Other Ambulatory Visit: Payer: Self-pay

## 2019-07-30 DIAGNOSIS — I10 Essential (primary) hypertension: Secondary | ICD-10-CM

## 2019-07-30 LAB — BASIC METABOLIC PANEL
BUN/Creatinine Ratio: 27 (ref 12–28)
BUN: 41 mg/dL — ABNORMAL HIGH (ref 8–27)
CO2: 21 mmol/L (ref 20–29)
Calcium: 9.6 mg/dL (ref 8.7–10.3)
Chloride: 104 mmol/L (ref 96–106)
Creatinine, Ser: 1.52 mg/dL — ABNORMAL HIGH (ref 0.57–1.00)
GFR calc Af Amer: 41 mL/min/{1.73_m2} — ABNORMAL LOW (ref >59)
GFR calc non Af Amer: 36 mL/min/{1.73_m2} — ABNORMAL LOW (ref >59)
Glucose: 96 mg/dL (ref 65–99)
Potassium: 4.2 mmol/L (ref 3.5–5.2)
Sodium: 141 mmol/L (ref 134–144)

## 2019-08-05 ENCOUNTER — Ambulatory Visit (INDEPENDENT_AMBULATORY_CARE_PROVIDER_SITE_OTHER): Payer: No Typology Code available for payment source | Admitting: Pharmacist

## 2019-08-05 ENCOUNTER — Other Ambulatory Visit: Payer: Self-pay

## 2019-08-05 VITALS — BP 140/88 | HR 36

## 2019-08-05 DIAGNOSIS — I5032 Chronic diastolic (congestive) heart failure: Secondary | ICD-10-CM | POA: Insufficient documentation

## 2019-08-05 DIAGNOSIS — I5020 Unspecified systolic (congestive) heart failure: Secondary | ICD-10-CM | POA: Insufficient documentation

## 2019-08-05 DIAGNOSIS — I10 Essential (primary) hypertension: Secondary | ICD-10-CM | POA: Diagnosis not present

## 2019-08-05 DIAGNOSIS — R9431 Abnormal electrocardiogram [ECG] [EKG]: Secondary | ICD-10-CM

## 2019-08-05 NOTE — Patient Instructions (Addendum)
It was a pleasure to see you again in clinic!  Try to purchase an OMRON blood pressure machine. You will need a cuff that is 12  22 cm (small adult)  Continue to check your blood pressure at home once a day.  Follow up in clinic in 2 weeks.  Before checking your blood pressure make sure: You are seated and quite for 5 min before checking Feet are flat on the floor Siting in chair with your back supported straight up and down Arm resting on table or arm of chair at heart level Bladder is empty You have NOT had caffeine or tobacco within the last 30 min  Check your blood pressure 2-3 times about 1-2 min apart. Usually the first reading will be the highest. Record these readings.  Only check your blood pressure once a day, unless otherwise directed Record your blood pressure readings and bring them to all your appointments. If your meter stores your readings in its memory, then you may bring your blood pressure meter with you to your appointments.  You can find a list of validated (accurate) blood pressure cuffs at WirelessNovelties.no  Lifestyle changes can make a world of difference and are even more important than medications: Try to keep your sodium intake to 2300 mg of sodium per day Get 6-8 uninterrupted hours of sleep per night Aim for a goal of 150 min of moderate aerobic exercise (ie brisk walking, bike riding) per week

## 2019-08-05 NOTE — Addendum Note (Signed)
Addended by: Theresia Majors on: 08/05/2019 03:55 PM   Modules accepted: Orders

## 2019-08-05 NOTE — Progress Notes (Signed)
Patient ID: Kendra Richardson                 DOB: 1955-11-07                      MRN: [id number]     HPI: Kendra Richardson is a 64 y.o. female referred by Dr. Anne Fu to HTN clinic. PMH is significant for uncontrolled HTN, CHF (EF 45-50%)- 2010, CKD stage 3, and left ventricular hypertrophy.  Patient was last seen by Dr. Anne Fu on 07/07/19. Reported that BP has been high for many years. Last year she used to wake up at night gasping for air knowing this is due to her BP, however this has not happened in a while. Patient reports subtle chest tightness when she is stressed, but this is rarely happens. The patient states she does not like to take medications.    At last HTN visit, patient's quinapril was stopped and she was started on Entresto 49/51 mg twice a day due to EF of 30-35% found on echo that day. She was asked to decrease spironolactone to 12.5mg  daily. She reported that she had recently stopped smoking. She was hesitant to exercise because she wanted to gain weight. BP in clinic was 172/105 with HR 61.   Patient presents today to clinic for follow up. She denies dizziness, lightheadedness, headache, blurred vision, SOB or swelling. She does report that she is feeling sluggish today. She has not been taking spironolactone because she is not able to split the tablets (too small per her report).  She brings in her home blood pressure cuff, [facility] brand. Cuff appears to big as patient is very petite. Blood pressure via manual cuff is 140/88. HR on pulse ox is in the 30-40's even on repeat. Home blood pressure cuff reports a blood pressure about 10-20 points higher and HR of 44. Patient has not yet taken her Bystolic today. Usually takes around 11.  I spoke with the doctor of the day, Dr. Mayford Knife, who requests that I get an EKG on the patient. Albin Felling, RN came to do the EKG, reviewed by Dr. Mayford Knife that showed bigeminal PVCs, HR 64.  Patient asks about taking oilve leaf  supplements for her BP.   Current HTN meds: amlodipine 10 mg daily, Entresto 49/51mg  twice a day, Bystolic 20 mg daily, clonidine 0.2 mg patch once a week Previously tried: HCTZ (lightheaded) BP goal: < 130/80 mmHg  Family History: Father and sister- heart disease and HTN. Mother- DM, HTN and breast cancer. Youngest brother and sister- HTN. Aunt- lung cancer. Another aunt- HTN. She states HTN is common on both sides of the family.  Social History: Stopped cigarette smoking 5 weeks ago. One 8 oz beer once every 3-4 weeks. Previous drug use.  Diet: Eats a lot of vegetables, fruits and fiber in diet. Fruits: apples, grapes, oranges, and berries. Likes to eat fruits with ice cream. Does not cook with salt or add salt. Raisin Bran or Cheerios w/ an apple for breakfast. Salads w/ chicken or fish. Ginger ale twice a week. Drinks Welch's grape juice w/ fiber, green tea, and plenty of water.  Exercise: walking, leg raises, punches  Home BP readings: 163/96, 160/97, 153/86, 166/85, 156/98, 167/73 HR 60's  Wt Readings from Last 3 Encounters:  07/07/19 102 lb 6.4 oz (46.4 kg)   BP Readings from Last 3 Encounters:  07/22/19 (!) 172/105  07/07/19 (!) 180/120   Pulse Readings from Last 3 Encounters:  07/22/19 61  07/07/19 64    Renal function: CrCl cannot be calculated (Unknown ideal weight.).  Past Medical History:  Diagnosis Date  . Abnormal radiographic examination   . CHF (congestive heart failure) ([place])   . Chronic kidney disease, stage III (moderate)   . Chronic kidney disease, stage III (moderate)   . CN (constipation)   . Essential hypertension, malignant   . HTN (hypertension)   . LVH (left ventricular hypertrophy)   . Protein-calorie malnutrition ([place])     Current Outpatient Medications on File Prior to Visit  Medication Sig Dispense Refill  . amLODipine (NORVASC) 10 MG tablet Take 10 mg by mouth daily.    Marland Kitchen aspirin EC 81 MG tablet Take 81 mg by mouth daily.    .  cloNIDine (CATAPRES - DOSED IN MG/24 HR) 0.2 mg/24hr patch Place 0.2 mg onto the skin once a week.    . Multiple Vitamin (MULTIVITAMIN) tablet Take 1 tablet by mouth daily.    . Nebivolol HCl (BYSTOLIC) 20 MG TABS Take by mouth.    . sacubitril-valsartan (ENTRESTO) 49-51 MG Take 1 tablet by mouth 2 (two) times daily. 60 tablet 11  . spironolactone (ALDACTONE) 25 MG tablet Take 0.5 tablets (12.5 mg total) by mouth daily. 30 tablet 6   No current facility-administered medications on file prior to visit.    Not on File   Assessment/Plan:  1. Hypertension - Blood pressure above goal of <130/80 today in clinic. However, blood pressure is much improved from last visit. Since patient is feeling sluggish today, I will not make any changes in medications. She could be feeling sluggish for several reasons (from PVC, from lower BP than she is use to). Will give patient more time to get use to medication and will follow up in 2 weeks. Will plan to increase Entresto at that time. Continue amlodipine 10 mg daily, Entresto 49/51mg  twice a day, Bystolic 20 mg daily, clonidine 0.2 mg patch once a week. I have instructed patient to purchase a new blood pressure monitor. Preferably an OMRON machine and I have measured her arm, she will need a 12  22 cm (small adult) cuff.   Ramond Dial, Pharm.D, BCPS, CPP [facility]  [address], [place], [place] [address]  Phone: [phone]; Fax: [phone]

## 2019-08-12 ENCOUNTER — Ambulatory Visit: Payer: No Typology Code available for payment source

## 2019-08-16 ENCOUNTER — Ambulatory Visit: Payer: No Typology Code available for payment source | Attending: Family

## 2019-08-16 DIAGNOSIS — Z23 Encounter for immunization: Secondary | ICD-10-CM | POA: Insufficient documentation

## 2019-08-16 NOTE — Progress Notes (Signed)
   Covid-19 Vaccination Clinic  Name:  Kendra Richardson    MRN: [id number] DOB: 1955-10-10  08/16/2019  Kendra Richardson was observed post Covid-19 immunization for 15 minutes without incidence. She was provided with Vaccine Information Sheet and instruction to access the V-Safe system.   Kendra Richardson was instructed to call 911 with any severe reactions post vaccine: Marland Kitchen Difficulty breathing  . Swelling of your face and throat  . A fast heartbeat  . A bad rash all over your body  . Dizziness and weakness    Immunizations Administered    Name Date Dose VIS Date Route   Moderna COVID-19 Vaccine 08/16/2019 11:01 AM 0.5 mL 05/25/2019 Intramuscular   Manufacturer: Moderna   Lot: [id number]   NDC: 30865-784-69

## 2019-08-19 ENCOUNTER — Encounter: Payer: Self-pay | Admitting: Pharmacist

## 2019-08-19 ENCOUNTER — Other Ambulatory Visit: Payer: Self-pay

## 2019-08-19 ENCOUNTER — Ambulatory Visit (INDEPENDENT_AMBULATORY_CARE_PROVIDER_SITE_OTHER): Payer: No Typology Code available for payment source | Admitting: Pharmacist

## 2019-08-19 VITALS — BP 134/96 | HR 58

## 2019-08-19 DIAGNOSIS — I10 Essential (primary) hypertension: Secondary | ICD-10-CM | POA: Diagnosis not present

## 2019-08-19 DIAGNOSIS — I5020 Unspecified systolic (congestive) heart failure: Secondary | ICD-10-CM | POA: Diagnosis not present

## 2019-08-19 MED ORDER — ENTRESTO 97-103 MG PO TABS: 1.0000 | ORAL_TABLET | Freq: Two times a day (BID) | ORAL | 3 refills | Status: DC

## 2019-08-19 NOTE — Patient Instructions (Signed)
It was a please to see you again!  Please increase your Entresto to 97/103mg  twice a day. You may take 2 of the 49/51mg  tablets twice a day until you run out. Make sure pharmacy has copay card information on file  Continue checking blood pressure once a day. Please bring your cuff with you to your next appointment  Call us at [phone] with any questions or concers  Before checking your blood pressure make sure: You are seated and quite for 5 min before checking Feet are flat on the floor Siting in chair with your back supported straight up and down Arm resting on table or arm of chair at heart level Bladder is empty You have NOT had caffeine or tobacco within the last 30 min  Check your blood pressure 2-3 times about 1-2 min apart. Usually the first reading will be the highest. Record these readings.  Only check your blood pressure once a day, unless otherwise directed Record your blood pressure readings and bring them to all your appointments. If your meter stores your readings in its memory, then you may bring your blood pressure meter with you to your appointments.  You can find a list of validated (accurate) blood pressure cuffs at WirelessNovelties.no  Lifestyle changes can make a world of difference and are even more important than medications: Try to keep your sodium intake to 2300 mg of sodium per day Get 6-8 uninterrupted hours of sleep per night Aim for a goal of 150 min of moderate aerobic exercise (ie brisk walking, bike riding) per week

## 2019-08-19 NOTE — Progress Notes (Signed)
Patient ID: Kendra Richardson                 DOB: [birth year]                      MRN: [id number]     HPI: Kendra Richardson is a 64 y.o. female referred by Dr. Marlou Porch to HTN clinic. PMH is significant for uncontrolled HTN, CHF (EF 45-50%)- 2010, CKD stage 3, and left ventricular hypertrophy.  Patient was last seen by Dr. Marlou Porch on 07/07/19. Reported that BP has been high for many years. Last year she used to wake up at night gasping for air knowing this is due to her BP, however this has not happened in a while. Patient reports subtle chest tightness when she is stressed, but this is rarely happens. The patient states she does not like to take medications.    At last HTN visit on 08/05/19 patients blood pressure was 140/88. She stated she was feeling a little sluggish. No changes were made in the hopes that patient would adjust to [facility] that had been recently added. It was also discovered that patient had bigeminal PVCs. Her home cuff was found to be inaccurate and she was asked to purchase a new one with an adult size small cuff.  Patient presents to clinic today for follow up. She denies dizziness, lightheadedness, headache, blurred vision, SOB or swelling. She has purchased a new OMRON blood pressure cuff. Did not bring it with her. Her home blood pressure readings average is 142/88. She has not felt sluggish since the last visit. She received her first COVID vaccine on Monday.  She has been trying to exercise. Did some exercise outside yesterday since the weather was so nice.  Blood pressure in clinic was 134/96  Current HTN/HF meds: amlodipine 10 mg daily, Entresto 49/51mg  twice a day, Bystolic 20 mg daily, clonidine 0.2 mg patch once a week Previously tried: HCTZ (lightheaded), spironolactone (increase in scr) BP goal: < 130/80 mmHg  Family History: Father and sister- heart disease and HTN. Mother- DM, HTN and breast cancer. Youngest brother and sister- HTN. Aunt-  lung cancer. Another aunt- HTN. She states HTN is common on both sides of the family.  Social History: Stopped cigarette smoking 5 weeks ago. One 8 oz beer once every 3-4 weeks. Previous drug use.  Diet: Eats a lot of vegetables, fruits and fiber in diet. Fruits: apples, grapes, oranges, and berries. Likes to eat fruits with ice cream. Does not cook with salt or add salt. Raisin Bran or Cheerios w/ an apple for breakfast. Salads w/ chicken or fish. Ginger ale twice a week. Drinks Welch's grape juice w/ fiber, green tea, and plenty of water.  Exercise: walking, leg raises, punches  Home BP readings: 146/95, 139/81, 139/89, 152/93, 137/86, 146/88, 135/90 HR 56-62  Wt Readings from Last 3 Encounters:  07/07/19 102 lb 6.4 oz (46.4 kg)   BP Readings from Last 3 Encounters:  08/05/19 140/88  07/22/19 (!) 172/105  07/07/19 (!) 180/120   Pulse Readings from Last 3 Encounters:  08/05/19 (!) 36  07/22/19 61  07/07/19 64    Renal function: CrCl cannot be calculated (Unknown ideal weight.).  Past Medical History:  Diagnosis Date  . Abnormal radiographic examination   . CHF (congestive heart failure) ([place])   . Chronic kidney disease, stage III (moderate)   . Chronic kidney disease, stage III (moderate)   . CN (constipation)   . Essential hypertension,  malignant   . HTN (hypertension)   . LVH (left ventricular hypertrophy)   . Protein-calorie malnutrition ([facility])     Current Outpatient Medications on File Prior to Visit  Medication Sig Dispense Refill  . amLODipine (NORVASC) 10 MG tablet Take 10 mg by mouth daily.    Marland Kitchen aspirin EC 81 MG tablet Take 81 mg by mouth daily.    . cloNIDine (CATAPRES - DOSED IN MG/24 HR) 0.2 mg/24hr patch Place 0.2 mg onto the skin once a week.    . Multiple Vitamin (MULTIVITAMIN) tablet Take 1 tablet by mouth daily.    . Nebivolol HCl (BYSTOLIC) 20 MG TABS Take by mouth.    . sacubitril-valsartan (ENTRESTO) 49-51 MG Take 1 tablet by mouth 2 (two) times  daily. 60 tablet 11   No current facility-administered medications on file prior to visit.    Not on File   Assessment/Plan:  1. Hypertension - Blood pressure above goal of <130/80 today in clinic. Will increase Entresto to target dose of 97/103mg  twice a day. Patient educated that she can take 2 of the 49/51mg  tablets twice a day until she runs out. I tried to active a $10 copay card for patient, but it stated that she already had one activated. I have printed the card info for patient incase it is not on-file. Follow up in clinic in 2 weeks for BP check and BMP.   Olene Floss, Pharm.D, BCPS, CPP [facility]  [address] [place], [place] [address]  Phone: [phone]; Fax: [phone]

## 2019-08-31 NOTE — Progress Notes (Signed)
Patient ID: Kendra Richardson                 DOB: Aug 21, 1955                      MRN: [id number]     HPI: Kendra Richardson is a 64 y.o. female referred by Dr. Anne Fu to HTN clinic. PMH is significant for uncontrolled HTN, CHF (EF 30-35%), CKD stage 3, and left ventricular hypertrophy.  Patient was last seen by Dr. Anne Fu on 07/07/19. Reported that BP has been high for many years. Last year she used to wake up at night gasping for air knowing this is due to her BP, however this has not happened in a while. Patient reports subtle chest tightness when she is stressed, but this is rarely happens. The patient states she does not like to take medications.  It was discovered at visit 08/05/19 that patient had bigeminal PVCs.   At last HTN visit on 08/19/19 patients blood pressure was 134/96 with home readings BP 135-150/80-95, HR 56-62. Patient had purchased a new Omron cuff but had not brought it in to clinic. Entresto was increased to goal dose of 97/103 mg twice a day and patient was encouraged to bring in home cuff to next visit.   Patient presents to clinic today for follow up of her HTN. Patient brought in her home BP readings as well as her home BP cuff. Home readings were 122/78, 123/84, 124/90, 142/90, HR 60's and patient endorsed taking them in the afternoon a few hours after taking her morning medications. In clinic, BP readings using her home BP cuff were 174/106 (right arm), 160/97 (left arm) and manually were 158/100 (left arm), 150/100 (right arm). Patient had not taken her medications yet this morning. Patient denied issues with dizziness, lightheadedness, or fatigue. Kendra Richardson was not aware she had a diagnosis of heart failure.  Current HTN/HF meds: amlodipine 10 mg daily, Entresto 97/103 mg twice a day, Bystolic 20 mg daily, clonidine 0.2 mg patch once a week Previously tried: HCTZ (lightheaded), spironolactone (increase in scr) BP goal: < 130/80 mmHg  Family  History: Father and sister- heart disease and HTN. Mother- DM, HTN and breast cancer. Youngest brother and sister- HTN. Aunt- lung cancer. Another aunt- HTN. She states HTN is common on both sides of the family.  Social History: Stopped cigarette smoking 5 weeks ago. One 8 oz beer once every 3-4 weeks. Previous drug use.  Diet: Eats a lot of vegetables, fruits and fiber in diet. Fruits: apples, grapes, oranges, and berries. Likes to eat fruits with ice cream. Does not cook with salt or add salt. Raisin Bran or Cheerios w/ an apple for breakfast. Salads w/ chicken or fish. Ginger ale twice a week. Drinks Welch's grape juice w/ fiber, green tea, and plenty of water.  Exercise: walking, leg raises, punches  Home BP readings: 122/78, 123/84, 124/90, 142/90, HR 60's  Wt Readings from Last 3 Encounters:  07/07/19 102 lb 6.4 oz (46.4 kg)   BP Readings from Last 3 Encounters:  08/19/19 (!) 134/96  08/05/19 140/88  07/22/19 (!) 172/105   Pulse Readings from Last 3 Encounters:  08/19/19 (!) 58  08/05/19 (!) 36  07/22/19 61    Renal function: CrCl cannot be calculated (Patient's most recent lab result is older than the maximum 21 days allowed.).  Past Medical History:  Diagnosis Date  . Abnormal radiographic examination   . CHF (congestive heart failure) (  [place])   . Chronic kidney disease, stage III (moderate)   . Chronic kidney disease, stage III (moderate)   . CN (constipation)   . Essential hypertension, malignant   . HTN (hypertension)   . LVH (left ventricular hypertrophy)   . Protein-calorie malnutrition ([place])     Current Outpatient Medications on File Prior to Visit  Medication Sig Dispense Refill  . amLODipine (NORVASC) 10 MG tablet Take 10 mg by mouth daily.    Marland Kitchen aspirin EC 81 MG tablet Take 81 mg by mouth daily.    . cloNIDine (CATAPRES - DOSED IN MG/24 HR) 0.2 mg/24hr patch Place 0.2 mg onto the skin once a week.    . Multiple Vitamin (MULTIVITAMIN) tablet Take 1 tablet by  mouth daily.    . Nebivolol HCl (BYSTOLIC) 20 MG TABS Take by mouth.    . sacubitril-valsartan (ENTRESTO) 97-103 MG Take 1 tablet by mouth 2 (two) times daily. 180 tablet 3   No current facility-administered medications on file prior to visit.    Not on File   Assessment/Plan:  1. Hypertension - Based on BP goal <130/80, patient is at her goal based on home blood pressure readings. Her blood pressure cuff from home seems to correlate well with our manual readings in clinic and, if anything, reads slightly higher. Elevated blood pressure in clinic today was most likely due to not having taken her medications yet this morning as well as white coat hypertension. Because of patient's recent ECHO revealing EF 35-40%, we discussed changing patient's beta blocker from Bystolic to one more supported by data in HF as well as long term trying to taper patient off of her clonidine patch. Patient was agreeable to switch to carvedilol and discussing tapering off of clonidine patch at another visit. Kendra Richardson heart failure diagnosis was explained in detail to her and she endorsed understanding. Patient to continue taking amlodipine 10mg  daily, Entresto 97/103mg  twice daily, and clonidine 2mg  patch once weekly. Patient to stop Bystolic and start taking carvedilol 12.5mg  twice daily. Patient to get BMET today and we will follow up with her labs and tolerance of new medication via telephone visit in 2 weeks.   Ladoris Gene, PharmD Candidate  Ramond Dial, Pharm.D, BCPS, CPP [facility]  [address] [place], [place] [address]  Phone: [phone]; Fax: [phone]

## 2019-09-02 ENCOUNTER — Other Ambulatory Visit: Payer: Self-pay

## 2019-09-02 ENCOUNTER — Ambulatory Visit (INDEPENDENT_AMBULATORY_CARE_PROVIDER_SITE_OTHER): Payer: No Typology Code available for payment source | Admitting: Pharmacist

## 2019-09-02 ENCOUNTER — Other Ambulatory Visit: Payer: No Typology Code available for payment source

## 2019-09-02 ENCOUNTER — Encounter: Payer: Self-pay | Admitting: Pharmacist

## 2019-09-02 VITALS — BP 150/100 | HR 67

## 2019-09-02 DIAGNOSIS — I5020 Unspecified systolic (congestive) heart failure: Secondary | ICD-10-CM

## 2019-09-02 DIAGNOSIS — I10 Essential (primary) hypertension: Secondary | ICD-10-CM | POA: Diagnosis not present

## 2019-09-02 LAB — BASIC METABOLIC PANEL
BUN/Creatinine Ratio: 23 (ref 12–28)
BUN: 35 mg/dL — ABNORMAL HIGH (ref 8–27)
CO2: 25 mmol/L (ref 20–29)
Calcium: 9.9 mg/dL (ref 8.7–10.3)
Chloride: 101 mmol/L (ref 96–106)
Creatinine, Ser: 1.55 mg/dL — ABNORMAL HIGH (ref 0.57–1.00)
GFR calc Af Amer: 40 mL/min/{1.73_m2} — ABNORMAL LOW (ref >59)
GFR calc non Af Amer: 35 mL/min/{1.73_m2} — ABNORMAL LOW (ref >59)
Glucose: 102 mg/dL — ABNORMAL HIGH (ref 65–99)
Potassium: 4.5 mmol/L (ref 3.5–5.2)
Sodium: 141 mmol/L (ref 134–144)

## 2019-09-02 MED ORDER — CARVEDILOL 12.5 MG PO TABS: 12.5000 mg | ORAL_TABLET | Freq: Two times a day (BID) | ORAL | 3 refills | Status: DC

## 2019-09-02 NOTE — Patient Instructions (Addendum)
It was great seeing you today!  Your blood pressure is at goal at <130/80. Congrats! Because your heart is not pumping blood as well, we want to switch you from Bystolic to carvedilol because it has better data in heart failure. Stop Bystolic and start taking carvedilol 12.5mg  twice daily starting tomorrow morning. Continue taking amlodipine 10mg  daily, clonidine 0.2mg  patch once weekly, and Entresto 97/103mg  twice daily.   Continue to monitor your blood pressure at home and record your readings. We will take a look at your blood work from today and get back to you with the results.   We will give you a call in 2 weeks to make sure you are tolerating your new medication well.

## 2019-09-07 ENCOUNTER — Other Ambulatory Visit: Payer: Self-pay | Admitting: Orthopedic Surgery

## 2019-09-07 DIAGNOSIS — M25512 Pain in left shoulder: Secondary | ICD-10-CM

## 2019-09-14 ENCOUNTER — Ambulatory Visit: Payer: No Typology Code available for payment source | Attending: Family

## 2019-09-14 DIAGNOSIS — Z23 Encounter for immunization: Secondary | ICD-10-CM

## 2019-09-14 NOTE — Progress Notes (Signed)
   Covid-19 Vaccination Clinic  Name:  Kendra Richardson    MRN: [id number] DOB: 08-23-1955  09/14/2019  [name] was observed post Covid-19 immunization for 15 minutes without incident. She was provided with Vaccine Information Sheet and instruction to access the V-Safe system.   [name] was instructed to call 911 with any severe reactions post vaccine: Marland Kitchen Difficulty breathing  . Swelling of face and throat  . A fast heartbeat  . A bad rash all over body  . Dizziness and weakness   Immunizations Administered    Name Date Dose VIS Date Route   Moderna COVID-19 Vaccine 09/14/2019 10:57 AM 0.5 mL 05/25/2019 Intramuscular   Manufacturer: Moderna   Lot: [id number]   NDC: 80998-338-25

## 2019-09-16 NOTE — Progress Notes (Signed)
Patient ID: Kendra Richardson                 DOB: 01/08/1956                      MRN: [id number]     HPI: Kendra Richardson is a 64 y.o. female referred by Dr. Anne Fu to HTN clinic. PMH is significant for uncontrolled HTN, CHF (EF 30-35%), CKD stage 3, left ventricular hypertrophy, and bigeminal PVCs.  At last visit in clinic 09/02/19, patient brought in her home BP readings as well as her home BP cuff. Home readings were at goal of <130/80 however her blood pressure was elevated on both her home cuff and manual cuff in clinic. This was most likely due to white coat hypertension and that patient had not yet taken her medications that morning. Kendra Richardson was not aware she had a diagnosis of heart failure. This diagnosis was explained in detail and patient was switched from Bystolic to carvedilol for better HF benefit and patient was agreeable to discussing tapering off of clonidine patch at next visit.  Patient was contacted over the phone today for follow up of her hypertension and heart failure. Patient reported tolerating her switch from Bystolic to carvedilol well with HR at home in the 60's and 70's. Patient's home blood pressure readings are averaging 120's/70-80's with a few higher readings following her second COVID vaccine when she had to take her blood pressure in her right arm due to soreness in her left. She had one other high reading when she was unable to take her evening meds the night before and had not yet had her morning meds. Patient was eager to discuss coming off of her clonidine patch. Patient's last BMET was normal (stable K 4.5, Scr 1.55 (baseline)).   Current HTN/HF meds: amlodipine 10 mg daily, Entresto 97/103 mg twice a day, carvedilol 12.5mg  twice daily, clonidine 0.2 mg patch once a week Previously tried: HCTZ (lightheaded), spironolactone (increase in scr), Bystolic (switched to carvedilol for HF benefit) BP goal: < 130/80 mmHg  Family History:  Father and sister- heart disease and HTN. Mother- DM, HTN and breast cancer. Youngest brother and sister- HTN. Aunt- lung cancer. Another aunt- HTN. She states HTN is common on both sides of the family.  Social History: Stopped cigarette smoking a few months ago. One 8 oz beer once every 3-4 weeks. Previous drug use.  Diet: Eats a lot of vegetables, fruits and fiber in diet. Fruits: apples, grapes, oranges, and berries. Likes to eat fruits with ice cream. Does not cook with salt or add salt. Raisin Bran or Cheerios w/ an apple for breakfast. Salads w/ chicken or fish. Ginger ale twice a week. Drinks Welch's grape juice w/ fiber, green tea, and plenty of water.  Exercise: walking, leg raises, punches  Home BP readings: takes in the morning 1-2 hours after her morning medications 125/84, HR 70 124/76, HR 71 121/84, HR 68 - 2nd COVID vaccine in left arm, began taking BP in right arm  143/91, HR 68 135/87, HR 72 117/78, HR 71  149/92, HR 74 - did not take evening or morning meds  Wt Readings from Last 3 Encounters:  07/07/19 102 lb 6.4 oz (46.4 kg)   BP Readings from Last 3 Encounters:  09/02/19 (!) 150/100  08/19/19 (!) 134/96  08/05/19 140/88   Pulse Readings from Last 3 Encounters:  09/02/19 67  08/19/19 (!) 58  08/05/19 (!) 36  Renal function: CrCl cannot be calculated (Unknown ideal weight.).  Past Medical History:  Diagnosis Date  . Abnormal radiographic examination   . CHF (congestive heart failure) ([facility])   . Chronic kidney disease, stage III (moderate)   . Chronic kidney disease, stage III (moderate)   . CN (constipation)   . Essential hypertension, malignant   . HTN (hypertension)   . LVH (left ventricular hypertrophy)   . Protein-calorie malnutrition ([facility])     Current Outpatient Medications on File Prior to Visit  Medication Sig Dispense Refill  . amLODipine (NORVASC) 10 MG tablet Take 10 mg by mouth daily.    Marland Kitchen aspirin EC 81 MG tablet Take 81 mg by mouth  daily.    . carvedilol (COREG) 12.5 MG tablet Take 1 tablet (12.5 mg total) by mouth 2 (two) times daily. 180 tablet 3  . cloNIDine (CATAPRES - DOSED IN MG/24 HR) 0.2 mg/24hr patch Place 0.2 mg onto the skin once a week.    . Multiple Vitamin (MULTIVITAMIN) tablet Take 1 tablet by mouth daily.    . sacubitril-valsartan (ENTRESTO) 97-103 MG Take 1 tablet by mouth 2 (two) times daily. 180 tablet 3   No current facility-administered medications on file prior to visit.    Not on File   Assessment/Plan:  1. Hypertension/HF optimization - Based on BP goal <130/80, patient's blood pressure is at goal with room to further titrate and optimize her heart failure medications. Because patient's heart rate is consistently in the 60's and 70's following switch from Bystolic to carvedilol, we will increase carvedilol to 25mg  twice daily. With patient's blood pressure well controlled, patient can begin tapering off of clonidine patch. Patient endorsed recently refilling her prescription, however, and currently has a 90 day supply of the 0.2mg  weekly patches at home. Patient to continue clonidine 0.2mg  patch once weekly until she has used up the last of this prescription and then we will plan to taper her to clonidine 0.1mg  patch once weekly and then discontinue clonidine completely. If BP is uncontrolled when stopping clonidine, patient could benefit from addition of Bidil or Jardiance. Patient to continue Entresto 97/103mg  twice daily and amlodipine 10mg  daily and follow up with a phone call in 2 weeks for BP check.   Ladoris Gene, PharmD Candidate  Ramond Dial, Pharm.D, BCPS, CPP [facility]  [address] [place], [place] [address]  Phone: [phone]; Fax: [phone]

## 2019-09-17 ENCOUNTER — Telehealth (INDEPENDENT_AMBULATORY_CARE_PROVIDER_SITE_OTHER): Payer: No Typology Code available for payment source | Admitting: Pharmacist

## 2019-09-17 ENCOUNTER — Other Ambulatory Visit: Payer: Self-pay

## 2019-09-17 MED ORDER — CARVEDILOL 25 MG PO TABS: 25.0000 mg | ORAL_TABLET | Freq: Two times a day (BID) | ORAL | 3 refills | Status: DC

## 2019-09-17 NOTE — Patient Instructions (Signed)
It was great talking to you today!  Your blood pressure is at your goal of <130/80. Continue taking Entresto 97/103mg  twice daily, amlodipine 10mg  daily, and clonidine 0.2mg  patch once weekly. Increase your carvedilol to 25mg  twice daily (feel free to take two 12.5mg  twice daily). We will plan to taper off of your clonidine patch when you run out of your 0.2mg  patch prescription.   Continue to take your blood pressure and heart rate daily 1-2 hours after your morning medications and we will follow up with a phone call in 2 weeks!

## 2019-10-01 ENCOUNTER — Telehealth (INDEPENDENT_AMBULATORY_CARE_PROVIDER_SITE_OTHER): Payer: No Typology Code available for payment source | Admitting: Pharmacist

## 2019-10-01 ENCOUNTER — Other Ambulatory Visit: Payer: Self-pay

## 2019-10-01 DIAGNOSIS — I10 Essential (primary) hypertension: Secondary | ICD-10-CM

## 2019-10-01 NOTE — Progress Notes (Signed)
Patient ID: Kendra Richardson                 DOB: Mar 12, 1956                      MRN: [id number]     HPI: Kendra Richardson is a 64 y.o. female referred by Dr. Anne Fu to HTN clinic. PMH is significant for uncontrolled HTN, CHF (EF 30-35%), CKD stage 3, left ventricular hypertrophy, and bigeminal PVCs.  At previous visit patient was switched from Bystolic to a HF beta blocker, carvedilol. At last visit carvedilol was increased to 25mg  twice a day. We had hoped to start tappering patient off of clonidine patch, however she had just gotten a 90 day supply of patches and wanted to use them. Therefore, we will hold off on this change for now.  Patient was contacted today via telephone for follow up. Patient states she is doing well. Denies dizziness, lightheadedness, headache, blurred vision, SOB pr swelling. She is tolerating higher dose of carvedilol well. She has a frozen shoulder and is going to physical therapy for it 2 times a week. She attributes a few of her higher readings to being in some pain from physical therapy.  She reports she has 8 patches of clonidine left. Puts patch on Sundays, therefore she should put her last patch on 5/30.  Current HTN/HF meds: amlodipine 10 mg daily, Entresto 97/103 mg twice a day, carvedilol 25mg  twice daily, clonidine 0.2 mg patch once a week Previously tried: HCTZ (lightheaded), spironolactone (increase in scr), Bystolic (switched to carvedilol for HF benefit) BP goal: < 130/80 mmHg  Family History: Father and sister- heart disease and HTN. Mother- DM, HTN and breast cancer. Youngest brother and sister- HTN. Aunt- lung cancer. Another aunt- HTN. She states HTN is common on both sides of the family.  Social History: Stopped cigarette smoking a few months ago. One 8 oz beer once every 3-4 weeks. Previous drug use.  Diet: Eats a lot of vegetables, fruits and fiber in diet. Fruits: apples, grapes, oranges, and berries. Likes to eat  fruits with ice cream. Does not cook with salt or add salt. Raisin Bran or Cheerios w/ an apple for breakfast. Salads w/ chicken or fish. Ginger ale twice a week. Drinks Welch's grape juice w/ fiber, green tea, and plenty of water.  Exercise: walking for 10-26min every day,  physical therapy on tues and thursdays  Home BP readings: 128/79 HR 66, 132/89 HR 60, 113/75 HR 67, 121/78 HR 65, 134/84 (physical therapy-pain) 129/85 HR 63  Wt Readings from Last 3 Encounters:  07/07/19 102 lb 6.4 oz (46.4 kg)   BP Readings from Last 3 Encounters:  09/02/19 (!) 150/100  08/19/19 (!) 134/96  08/05/19 140/88   Pulse Readings from Last 3 Encounters:  09/02/19 67  08/19/19 (!) 58  08/05/19 (!) 36    Renal function: CrCl cannot be calculated (Patient's most recent lab result is older than the maximum 21 days allowed.).  Past Medical History:  Diagnosis Date  . Abnormal radiographic examination   . CHF (congestive heart failure) ([facility])   . Chronic kidney disease, stage III (moderate)   . Chronic kidney disease, stage III (moderate)   . CN (constipation)   . Essential hypertension, malignant   . HTN (hypertension)   . LVH (left ventricular hypertrophy)   . Protein-calorie malnutrition ([facility])     Current Outpatient Medications on File Prior to Visit  Medication Sig Dispense Refill  .  amLODipine (NORVASC) 10 MG tablet Take 10 mg by mouth daily.    Marland Kitchen aspirin EC 81 MG tablet Take 81 mg by mouth daily.    . carvedilol (COREG) 25 MG tablet Take 1 tablet (25 mg total) by mouth 2 (two) times daily. 180 tablet 3  . cloNIDine (CATAPRES - DOSED IN MG/24 HR) 0.2 mg/24hr patch Place 0.2 mg onto the skin once a week.    . Multiple Vitamin (MULTIVITAMIN) tablet Take 1 tablet by mouth daily.    . sacubitril-valsartan (ENTRESTO) 97-103 MG Take 1 tablet by mouth 2 (two) times daily. 180 tablet 3   No current facility-administered medications on file prior to visit.    Not on  File   Assessment/Plan:  1. Hypertension/HF optimization - Blood pressure is controlled at goal of <130/80. I will call patient at the end of the month to make sure blood pressure is still doing well. I will also contact patient at the end of May to coordinate the taper off of clonidine. I believe we will need to start another medication in its place, therefore will plan to star Bidil (or its components depending on cost) for blood pressure lowering and HF benefit. Kendra Richardson is another option, but I do feel like I may need more blood pressure control.  Ramond Dial, Pharm.D, BCPS, CPP [facility]  [address], [place], [place] [address]  Phone: [phone]; Fax: [phone]

## 2019-10-04 ENCOUNTER — Ambulatory Visit
Admission: RE | Admit: 2019-10-04 | Discharge: 2019-10-04 | Disposition: A | Discharge status: Home / Self Care | Payer: No Typology Code available for payment source | Source: Ambulatory Visit | Attending: Orthopedic Surgery | Admitting: Orthopedic Surgery

## 2019-10-04 DIAGNOSIS — M25512 Pain in left shoulder: Secondary | ICD-10-CM

## 2019-10-04 IMAGING — MR MR SHOULDER*L* W/O CM
4 of 5 series · 26 of 40 positions shown · non-contrast
Comparison: None.

CLINICAL DATA: Chronic left shoulder pain since fall 8 months ago.

EXAM:
MRI OF THE LEFT SHOULDER WITHOUT CONTRAST
TECHNIQUE: Multiplanar, multisequence MR imaging of the shoulder was performed.
No intravenous contrast was administered.

[Series 6: PD fat-sat · axial · left · 4.0mm · 0.44mm/px · z∈[-38,+52]mm · 8 of 20 slices shown (1 of 2)]
[im 1/20]
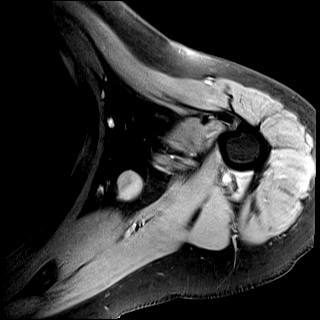
[im 3/20]
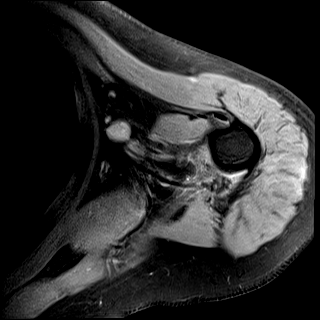
[im 7/20]
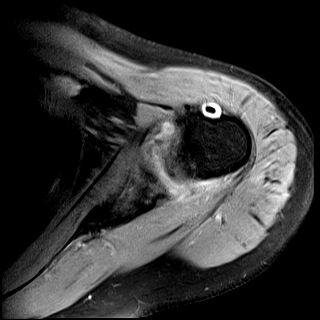
[im 9/20]
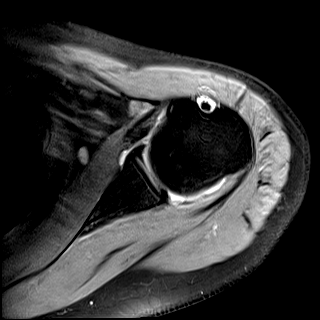
[im 11/20]
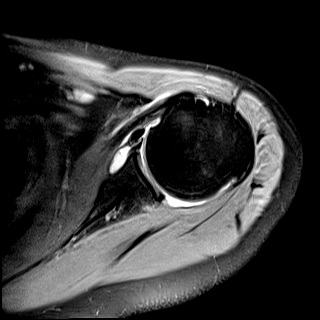
[im 13/20]
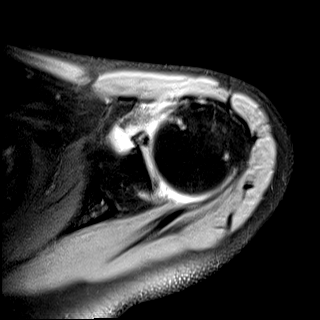
[im 17/20]
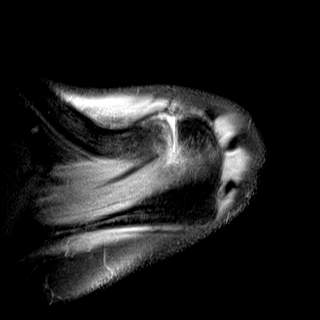
[im 20/20]
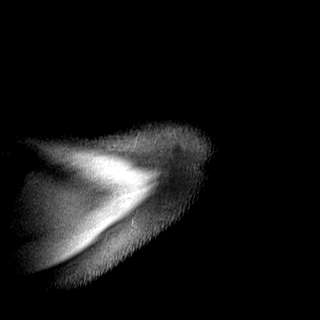

[Series 7: T2 fat-sat · oblique · left · 4.0mm · 0.27mm/px · 7 of 16 slices shown (1 of 2)]
[im 1/16]
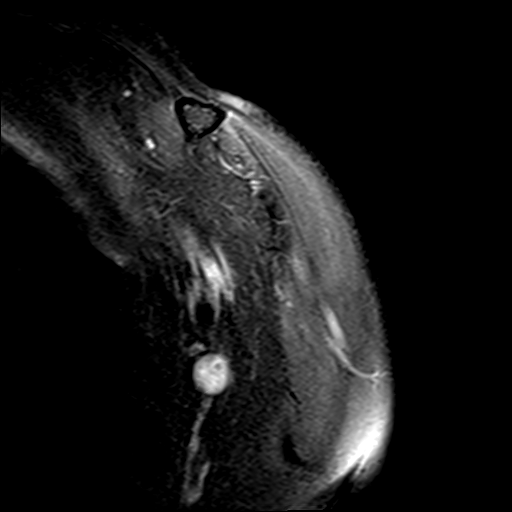
[im 3/16]
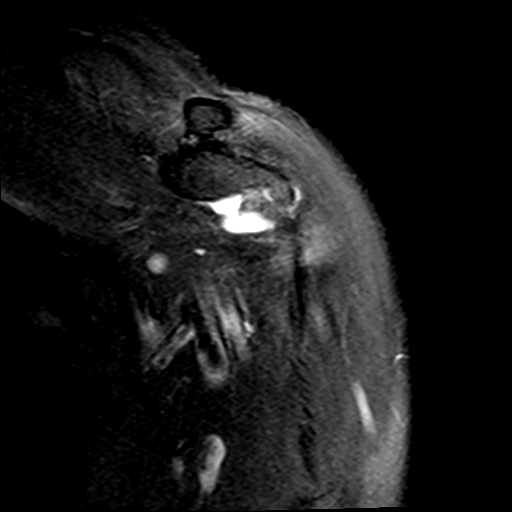
[im 6/16]
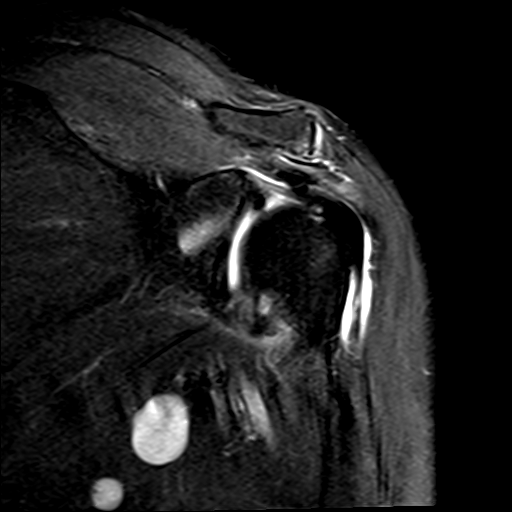
[im 8/16]
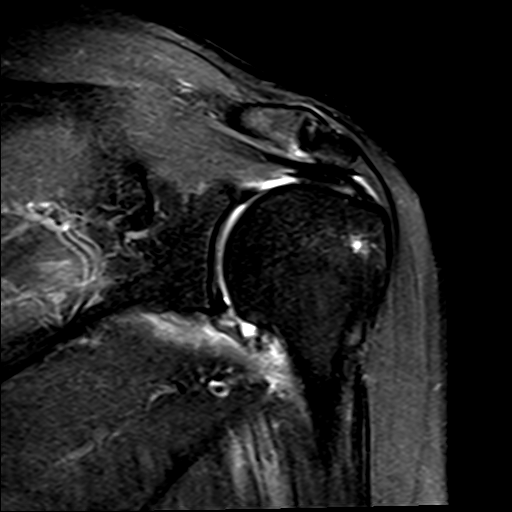
[im 11/16]
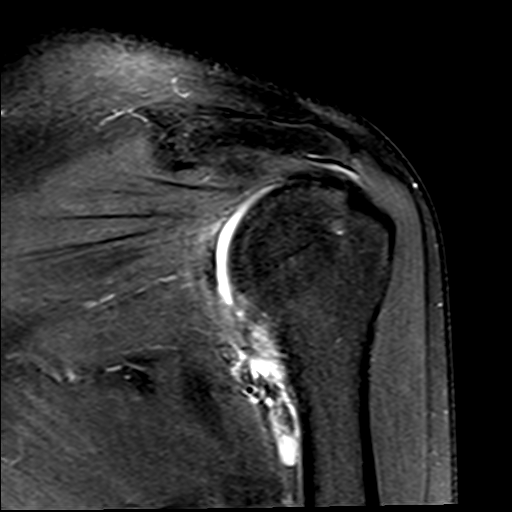
[im 13/16]
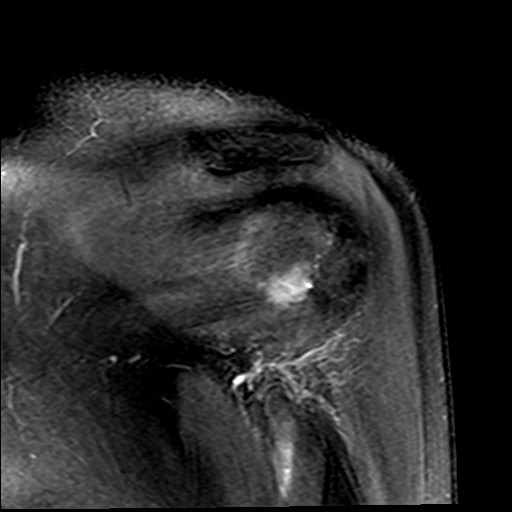
[im 16/16]
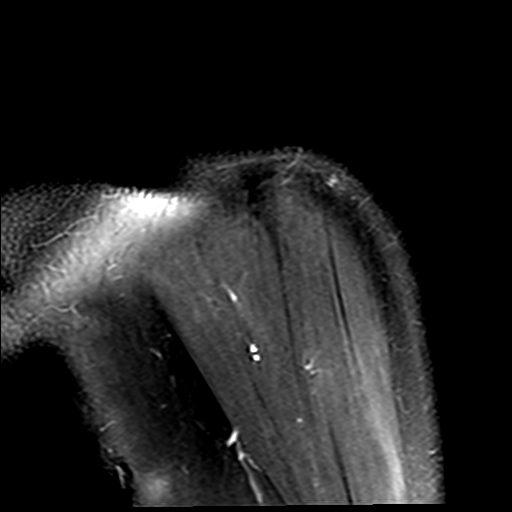

[Series 8: PD fat-sat · oblique · left · 4.0mm · 0.27mm/px · 7 of 16 slices shown (2 of 2)]
[im 1/16]
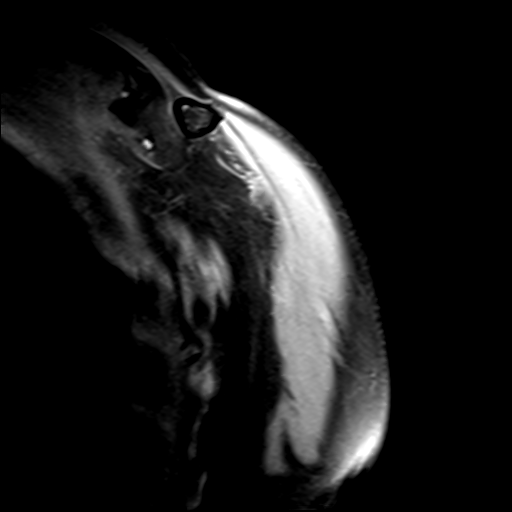
[im 3/16]
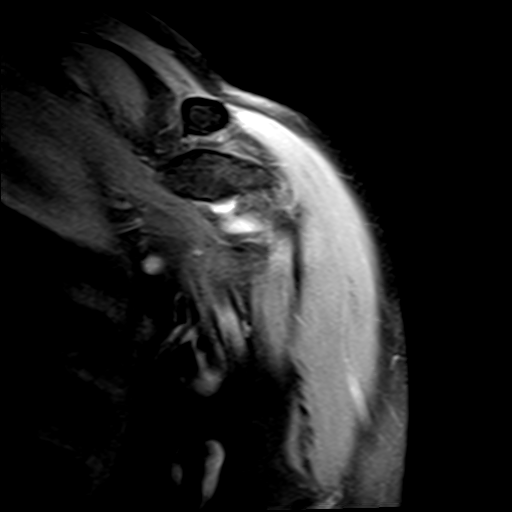
[im 6/16]
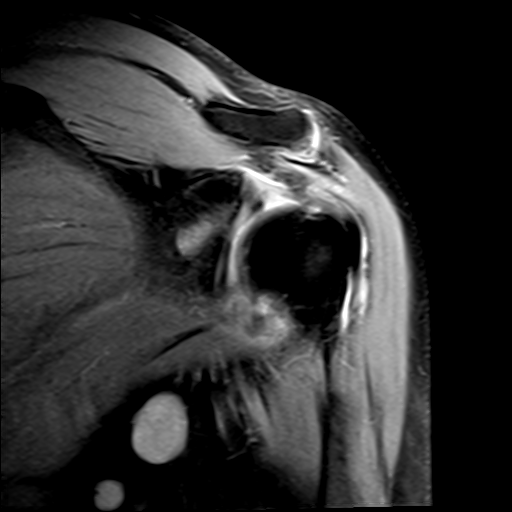
[im 8/16]
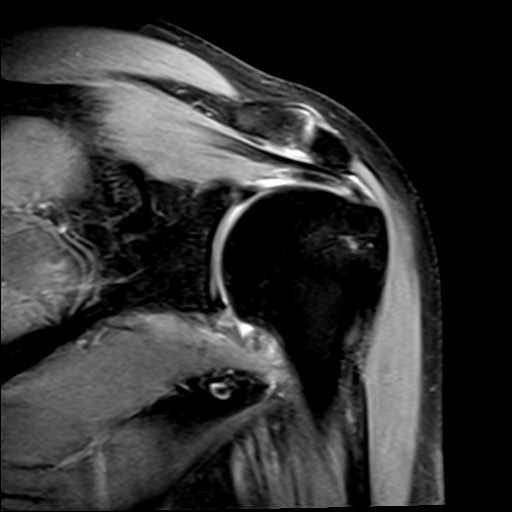
[im 11/16]
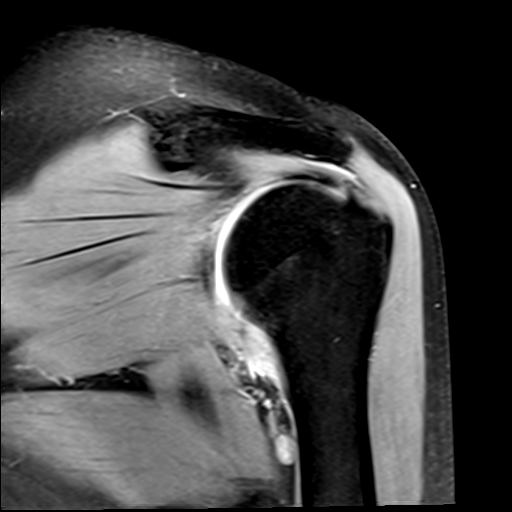
[im 13/16]
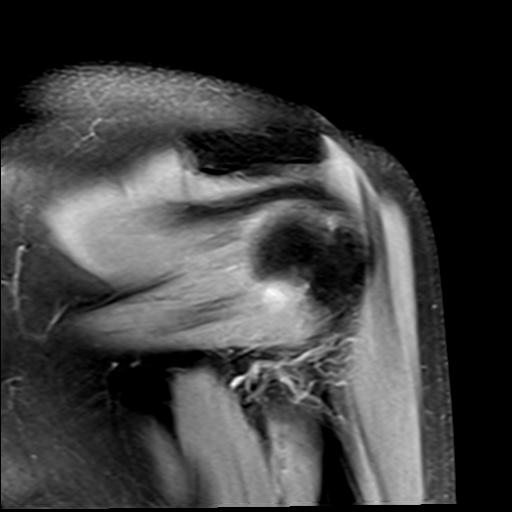
[im 16/16]
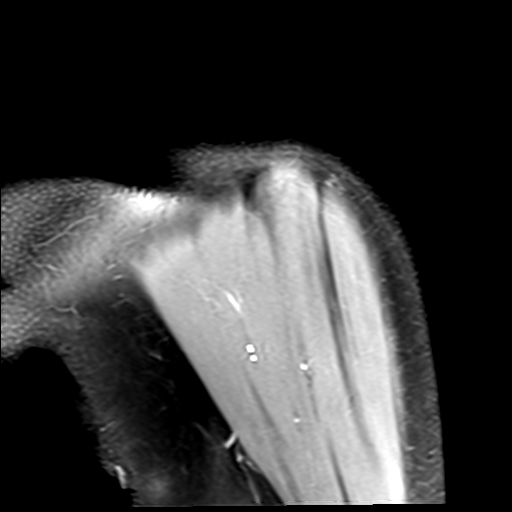

[Series 9: T2 fat-sat · oblique · left · 4.0mm · 0.44mm/px · 4 of 18 slices shown (2 of 2)]
[im 1/18]
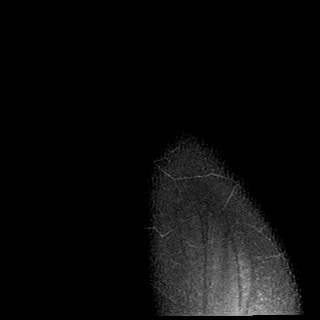
[im 3/18]
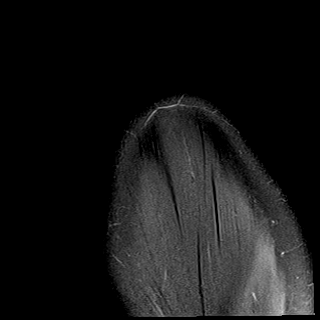
[im 10/18]
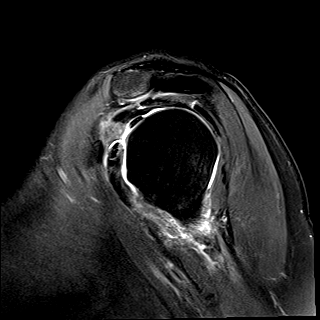
[im 15/18]
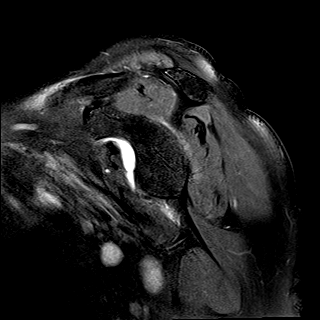

[26 of 40 positions shown; findings below may reference images not displayed]

FINDINGS: Rotator cuff: Intact rotator cuff. Mild supraspinatus and
subscapularis tendinosis.

Muscles: No atrophy or abnormal signal of the muscles of the rotator
cuff.

Biceps long head:  Intact and normally positioned.

Acromioclavicular Joint: Mild arthropathy of the acromioclavicular
joint. Type I acromion. Trace fluid in the subacromial/subdeltoid
bursa.

Glenohumeral Joint: Small joint effusion. No chondral defect.

Labrum:  Grossly intact.

Bones:  No marrow abnormality, fracture or dislocation.

Other: Loss of the normal fat within the rotator interval.
Thickening and irregularity of the inferior glenohumeral ligaments
with edema in the axillary recess.

Prominent left axillary lymph nodes measuring up to 1.2 cm in short
axis.
IMPRESSION: 1. Findings suggestive of adhesive capsulitis.
2. Mild supraspinatus and subscapularis tendinosis. No rotator cuff
tendon tear.
3. Mild acromioclavicular osteoarthritis.
4. Mild left axillary lymphadenopathy measuring up to 1.2 cm in
short axis. Correlation with diagnostic mammogram and ultrasound
recommended.

## 2019-10-22 ENCOUNTER — Telehealth: Payer: Self-pay | Admitting: Pharmacist

## 2019-10-22 MED ORDER — BIDIL 20-37.5 MG PO TABS: 1.0000 | ORAL_TABLET | Freq: Three times a day (TID) | ORAL | 11 refills | Status: DC

## 2019-10-22 NOTE — Telephone Encounter (Signed)
Pt called clinic to follow up with BP. Reports BP has been controlled: 117/74, 124/84, 136/89, 154/81 (higher reading when she had shoulder pain), 126/89, 120/84, HR 60s.  She has 2 clonidine patches left but does not want to use them. Will take her current patch off on Sunday. Per Melissa's last note, plan to stop clonidine patch and start Bidil for CHF benefit. Rx sent for Bidil 1 tab TID to her pharmacy. Copay card has been activated and sent to pharmacy, confirmed $0 copay for first month (may go up to $25 for subsequent copays). Pt will start Bidil on Sunday after she takes off her clonidine patch and will monitor her BP at home.  Will forward to [facility] as an FYI and for any potential follow up.

## 2019-10-25 ENCOUNTER — Telehealth: Payer: Self-pay | Admitting: Pharmacist

## 2019-10-25 NOTE — Telephone Encounter (Signed)
Patient called stating she pulled the clonidine patch off last night. She took 2 doses of BiDi (one last night and one around 7:30-8:00 this AM) She states she thinks she passed out.  she now feels light headed and shaky. I asked if she would be able to get some water to drink. States she has some right next to her. I advised that she drink lots of water today and something salty to bring BP back up.  Think she still has too much clonidine in her system. Can take several days to fully to clear the body.  I asked her to hold the Bidil until she sees her blood pressures come back up. At least a few days. She is to call me if BP starts to run high. I will call her on Thursday to see how she is doing. I asked if there was someone she could call if she needed help and she said she could call her sister.

## 2019-10-26 ENCOUNTER — Inpatient Hospital Stay (HOSPITAL_COMMUNITY)
Admission: EM | Admit: 2019-10-26 | Discharge: 2019-10-28 | DRG: 065 | Disposition: A | Discharge status: Home / Self Care | Payer: Medicare Other | Attending: Internal Medicine | Admitting: Internal Medicine

## 2019-10-26 ENCOUNTER — Encounter (HOSPITAL_COMMUNITY): Payer: Self-pay | Admitting: Emergency Medicine

## 2019-10-26 ENCOUNTER — Inpatient Hospital Stay (HOSPITAL_COMMUNITY): Payer: Medicare Other

## 2019-10-26 ENCOUNTER — Emergency Department (HOSPITAL_COMMUNITY): Payer: Medicare Other

## 2019-10-26 ENCOUNTER — Other Ambulatory Visit: Payer: Self-pay

## 2019-10-26 DIAGNOSIS — H532 Diplopia: Secondary | ICD-10-CM | POA: Diagnosis present

## 2019-10-26 DIAGNOSIS — E785 Hyperlipidemia, unspecified: Secondary | ICD-10-CM | POA: Diagnosis present

## 2019-10-26 DIAGNOSIS — I639 Cerebral infarction, unspecified: Secondary | ICD-10-CM | POA: Diagnosis present

## 2019-10-26 DIAGNOSIS — Z841 Family history of disorders of kidney and ureter: Secondary | ICD-10-CM

## 2019-10-26 DIAGNOSIS — Z20822 Contact with and (suspected) exposure to covid-19: Secondary | ICD-10-CM | POA: Diagnosis present

## 2019-10-26 DIAGNOSIS — Z803 Family history of malignant neoplasm of breast: Secondary | ICD-10-CM

## 2019-10-26 DIAGNOSIS — N1831 Chronic kidney disease, stage 3a: Secondary | ICD-10-CM | POA: Diagnosis present

## 2019-10-26 DIAGNOSIS — R002 Palpitations: Secondary | ICD-10-CM

## 2019-10-26 DIAGNOSIS — I5020 Unspecified systolic (congestive) heart failure: Secondary | ICD-10-CM | POA: Diagnosis not present

## 2019-10-26 DIAGNOSIS — I5022 Chronic systolic (congestive) heart failure: Secondary | ICD-10-CM | POA: Diagnosis present

## 2019-10-26 DIAGNOSIS — I429 Cardiomyopathy, unspecified: Secondary | ICD-10-CM | POA: Diagnosis present

## 2019-10-26 DIAGNOSIS — I493 Ventricular premature depolarization: Secondary | ICD-10-CM | POA: Diagnosis present

## 2019-10-26 DIAGNOSIS — M7502 Adhesive capsulitis of left shoulder: Secondary | ICD-10-CM | POA: Diagnosis present

## 2019-10-26 DIAGNOSIS — I671 Cerebral aneurysm, nonruptured: Secondary | ICD-10-CM | POA: Diagnosis present

## 2019-10-26 DIAGNOSIS — Z823 Family history of stroke: Secondary | ICD-10-CM | POA: Diagnosis not present

## 2019-10-26 DIAGNOSIS — I63322 Cerebral infarction due to thrombosis of left anterior cerebral artery: Secondary | ICD-10-CM | POA: Diagnosis present

## 2019-10-26 DIAGNOSIS — Z87891 Personal history of nicotine dependence: Secondary | ICD-10-CM | POA: Diagnosis not present

## 2019-10-26 DIAGNOSIS — I679 Cerebrovascular disease, unspecified: Secondary | ICD-10-CM | POA: Diagnosis not present

## 2019-10-26 DIAGNOSIS — I130 Hypertensive heart and chronic kidney disease with heart failure and stage 1 through stage 4 chronic kidney disease, or unspecified chronic kidney disease: Secondary | ICD-10-CM | POA: Diagnosis present

## 2019-10-26 DIAGNOSIS — Z7982 Long term (current) use of aspirin: Secondary | ICD-10-CM

## 2019-10-26 DIAGNOSIS — I361 Nonrheumatic tricuspid (valve) insufficiency: Secondary | ICD-10-CM | POA: Diagnosis not present

## 2019-10-26 DIAGNOSIS — Z9181 History of falling: Secondary | ICD-10-CM | POA: Diagnosis not present

## 2019-10-26 DIAGNOSIS — I672 Cerebral atherosclerosis: Secondary | ICD-10-CM | POA: Diagnosis present

## 2019-10-26 DIAGNOSIS — I10 Essential (primary) hypertension: Secondary | ICD-10-CM | POA: Diagnosis not present

## 2019-10-26 DIAGNOSIS — Z79899 Other long term (current) drug therapy: Secondary | ICD-10-CM

## 2019-10-26 DIAGNOSIS — I5032 Chronic diastolic (congestive) heart failure: Secondary | ICD-10-CM | POA: Diagnosis present

## 2019-10-26 DIAGNOSIS — R55 Syncope and collapse: Secondary | ICD-10-CM | POA: Diagnosis present

## 2019-10-26 DIAGNOSIS — N183 Chronic kidney disease, stage 3 unspecified: Secondary | ICD-10-CM

## 2019-10-26 LAB — CBC
HCT: 41.7 % (ref 36.0–46.0)
Hemoglobin: 13.6 g/dL (ref 12.0–15.0)
MCH: 28.8 pg (ref 26.0–34.0)
MCHC: 32.6 g/dL (ref 30.0–36.0)
MCV: 88.2 fL (ref 80.0–100.0)
Platelets: 194 10*3/uL (ref 150–400)
RBC: 4.73 MIL/uL (ref 3.87–5.11)
RDW: 12.5 % (ref 11.5–15.5)
WBC: 6.3 10*3/uL (ref 4.0–10.5)
nRBC: 0 % (ref 0.0–0.2)

## 2019-10-26 LAB — URINALYSIS, ROUTINE W REFLEX MICROSCOPIC
Bacteria, UA: NONE SEEN
Bilirubin Urine: NEGATIVE
Glucose, UA: NEGATIVE mg/dL
Hgb urine dipstick: NEGATIVE
Ketones, ur: NEGATIVE mg/dL
Leukocytes,Ua: NEGATIVE
Nitrite: NEGATIVE
Protein, ur: 100 mg/dL — AB
Specific Gravity, Urine: 1.012 (ref 1.005–1.030)
pH: 7 (ref 5.0–8.0)

## 2019-10-26 LAB — BASIC METABOLIC PANEL
Anion gap: 10 (ref 5–15)
Anion gap: 11 (ref 5–15)
BUN: 24 mg/dL — ABNORMAL HIGH (ref 8–23)
BUN: 23 mg/dL (ref 8–23)
CO2: 25 mmol/L (ref 22–32)
CO2: 25 mmol/L (ref 22–32)
Calcium: 9.9 mg/dL (ref 8.9–10.3)
Calcium: 10.3 mg/dL (ref 8.9–10.3)
Chloride: 106 mmol/L (ref 98–111)
Chloride: 107 mmol/L (ref 98–111)
Creatinine, Ser: 1.2 mg/dL — ABNORMAL HIGH (ref 0.44–1.00)
Creatinine, Ser: 1.14 mg/dL — ABNORMAL HIGH (ref 0.44–1.00)
GFR calc Af Amer: 55 mL/min — ABNORMAL LOW (ref >60)
GFR calc Af Amer: 58 mL/min — ABNORMAL LOW (ref >60)
GFR calc non Af Amer: 47 mL/min — ABNORMAL LOW (ref >60)
GFR calc non Af Amer: 50 mL/min — ABNORMAL LOW (ref >60)
Glucose, Bld: 111 mg/dL — ABNORMAL HIGH (ref 70–99)
Glucose, Bld: 99 mg/dL (ref 70–99)
Potassium: 4.2 mmol/L (ref 3.5–5.1)
Potassium: 3.8 mmol/L (ref 3.5–5.1)
Sodium: 141 mmol/L (ref 135–145)
Sodium: 143 mmol/L (ref 135–145)

## 2019-10-26 LAB — CBG MONITORING, ED: Glucose-Capillary: 98 mg/dL (ref 70–99)

## 2019-10-26 LAB — RESPIRATORY PANEL BY RT PCR (FLU A&B, COVID)
Influenza A by PCR: NEGATIVE
Influenza B by PCR: NEGATIVE
SARS Coronavirus 2 by RT PCR: NEGATIVE

## 2019-10-26 IMAGING — MR MR HEAD W/O CM
11 series · 48 of 48 positions shown · non-contrast
Comparison: None.

CLINICAL DATA: Diplopia.  Hypertension.

EXAM:
MRI HEAD WITHOUT CONTRAST
TECHNIQUE: Multiplanar, multiecho pulse sequences of the brain and surrounding
structures were obtained without intravenous contrast.

[Series 5: DWI · axial · 3.0mm · 1.36mm/px · z∈[-68,+78]mm · 8 of 100 slices shown (1 of 4)]
[im 1/100]
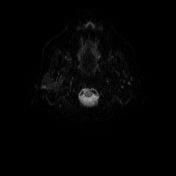
[im 15/100]
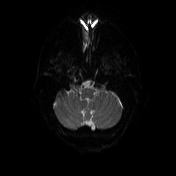
[im 29/100]
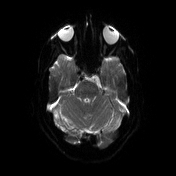
[im 43/100]
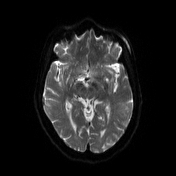
[im 57/100]
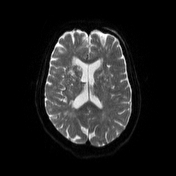
[im 71/100]
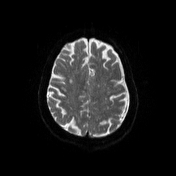
[im 85/100]
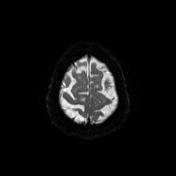
[im 100/100]
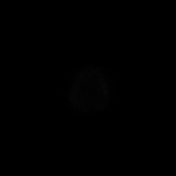

[Series 6: DWI · axial · 3.0mm · 1.36mm/px · z∈[-68,+78]mm · 4 of 50 slices shown (2 of 4)]
[im 1/50]
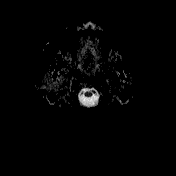
[im 17/50]
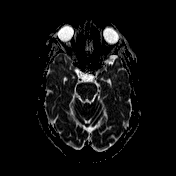
[im 33/50]
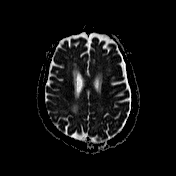
[im 50/50]
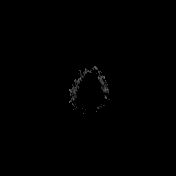

[Series 7: T1 · sagittal · 5.0mm · 0.75mm/px · 2 of 24 slices shown (1 of 2)]
[im 1/24]
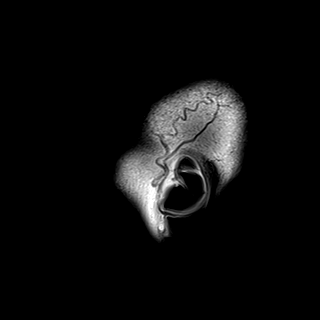
[im 24/24]
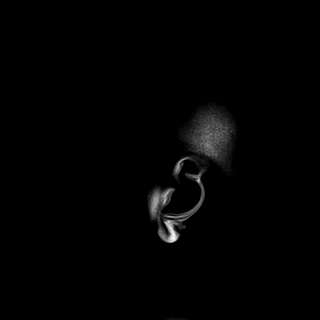

[Series 8: T2 · axial · 5.0mm · 0.62mm/px · z∈[-75,+86]mm · 2 of 26 slices shown (1 of 2)]
[im 1/26]
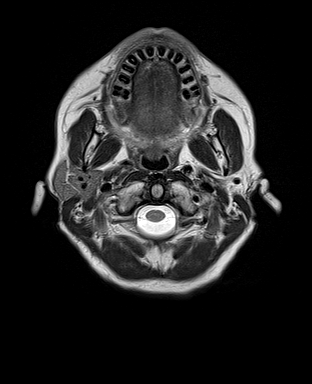
[im 26/26]
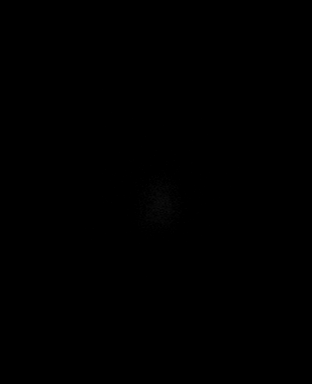

[Series 9: mip_images(sw) · axial · 24.0mm · 0.75mm/px · z∈[-66,+77]mm · 4 of 49 slices shown]
[im 1/49]
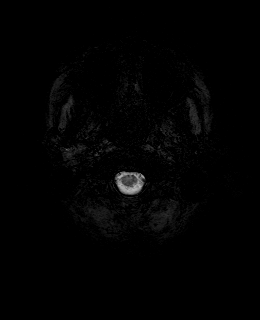
[im 17/49]
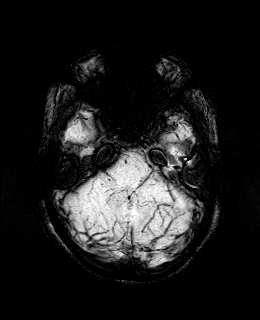
[im 33/49]
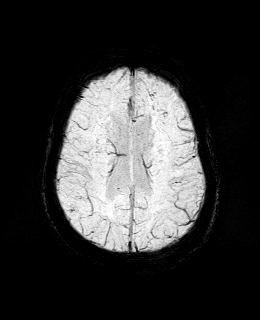
[im 49/49]
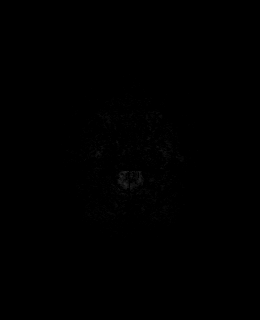

[Series 10: swi_images · axial · 3.0mm · 0.75mm/px · z∈[-77,+87]mm · 4 of 56 slices shown]
[im 1/56]
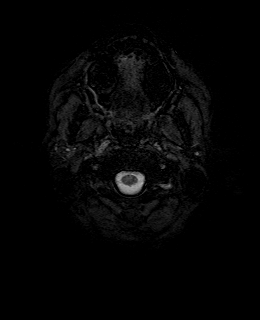
[im 19/56]
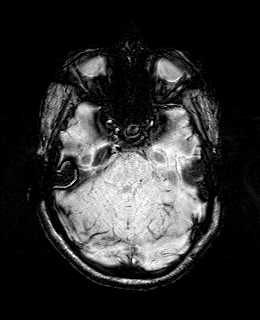
[im 37/56]
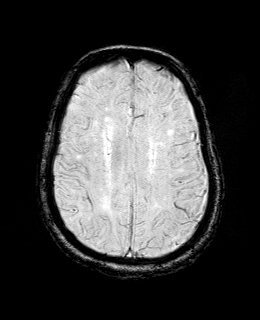
[im 56/56]
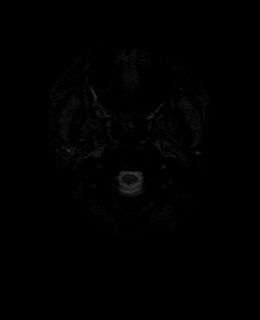

[Series 11: FLAIR · axial · 3.0mm · 0.75mm/px · z∈[-71,+81]mm · 4 of 52 slices shown]
[im 1/52]
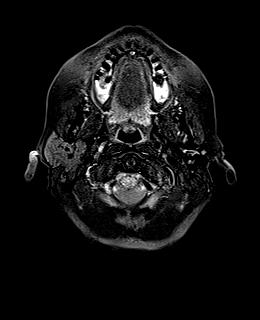
[im 18/52]
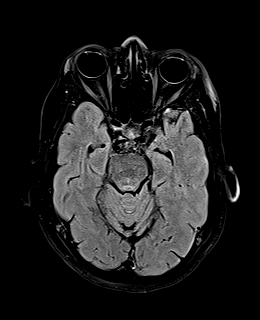
[im 35/52]
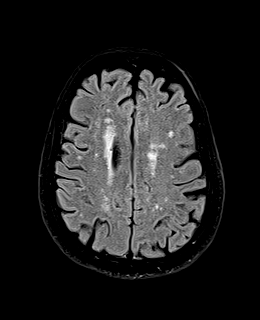
[im 52/52]
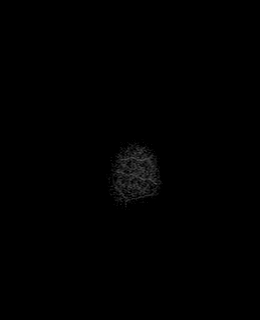

[Series 12: T1 · axial · 1.0mm · 0.94mm/px · z∈[-66,+76]mm · 11 of 144 slices shown (2 of 2)]
[im 1/144]
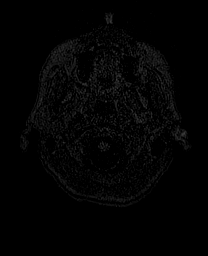
[im 15/144]
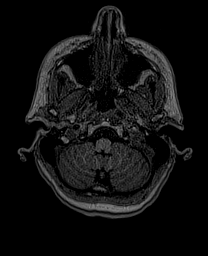
[im 29/144]
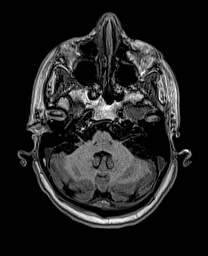
[im 43/144]
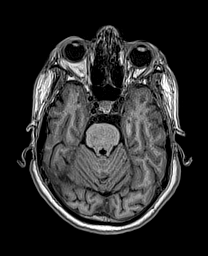
[im 58/144]
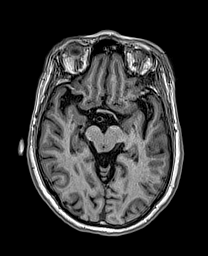
[im 72/144]
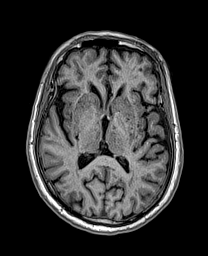
[im 86/144]
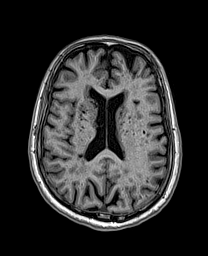
[im 101/144]
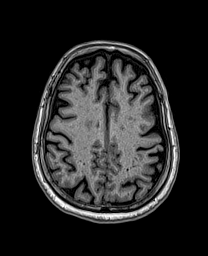
[im 115/144]
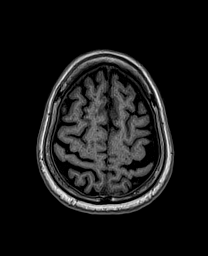
[im 129/144]
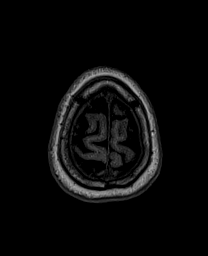
[im 144/144]
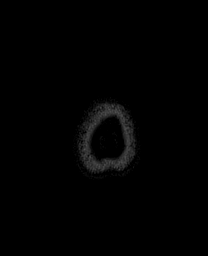

[Series 13: DWI · coronal · 5.0mm · 1.31mm/px · 5 of 64 slices shown (3 of 4)]
[im 1/64]
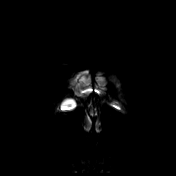
[im 16/64]
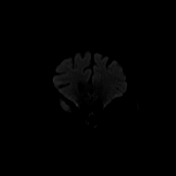
[im 32/64]
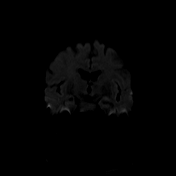
[im 48/64]
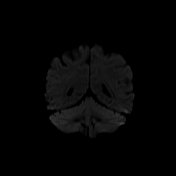
[im 64/64]
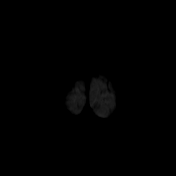

[Series 14: DWI · coronal · 5.0mm · 1.31mm/px · 2 of 32 slices shown (4 of 4)]
[im 1/32]
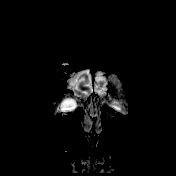
[im 32/32]
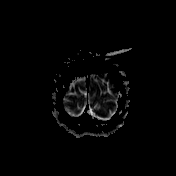

[Series 15: T2 · coronal · 5.0mm · 0.86mm/px · 2 of 25 slices shown (2 of 2)]
[im 1/25]
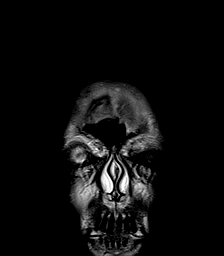
[im 25/25]
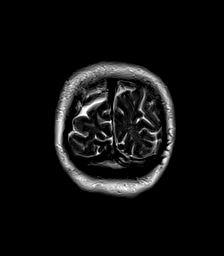

[48 of 48 positions shown; findings below may reference images not displayed]

FINDINGS: Brain: Small foci of acute infarct in the left anterior insular
cortex and the left lateral frontal lobe involving the cortex. Both
of these infarcts are approximately 3 mm in size.

Moderate chronic microvascular ischemic change in the white matter.
Chronic ischemic change in the basal ganglia and pons. Small chronic
infarct right cerebellum.

Scattered areas of microhemorrhage including the left cerebellum,
left thalamus, right occipital parietal lobe.

Vascular: Normal arterial flow voids

Skull and upper cervical spine: Negative

Sinuses/Orbits: Negative

Other: None
IMPRESSION: 1. 2 small areas of acute infarct in the left frontal lobe.
2. Moderate chronic microvascular ischemic change. Scattered areas
of chronic microhemorrhage in the brain compatible with chronic
hypertension.

## 2019-10-26 IMAGING — CT CT ANGIO NECK
1 of 8 series · 11 of 46 positions shown, 16 images · IV contrast (OMNI)
Comparison: Prior MRI from [DATE].

CLINICAL DATA: Follow-up examination for acute stroke.

EXAM:
CT ANGIOGRAPHY HEAD AND NECK
TECHNIQUE: Multidetector CT imaging of the head and neck was performed using
the standard protocol during bolus administration of intravenous
contrast. Multiplanar CT image reconstructions and MIPs were
obtained to evaluate the vascular anatomy. Carotid stenosis
measurements (when applicable) are obtained utilizing NASCET
criteria, using the distal internal carotid diameter as the
denominator.
CONTRAST:  75mL OMNIPAQUE IOHEXOL 350 MG/ML SOLN

[Series 12: thin · axial · 0.41mm/px · z∈[-305,-18]mm · 11 of 657 slices shown, 16 images]
[im 42/657  soft-tissue]
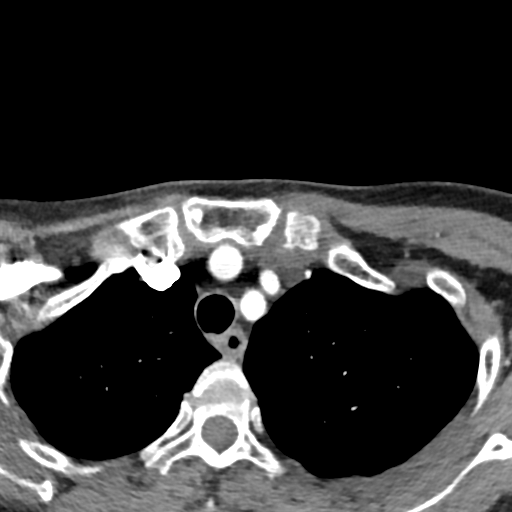
[im 42/657  bone]
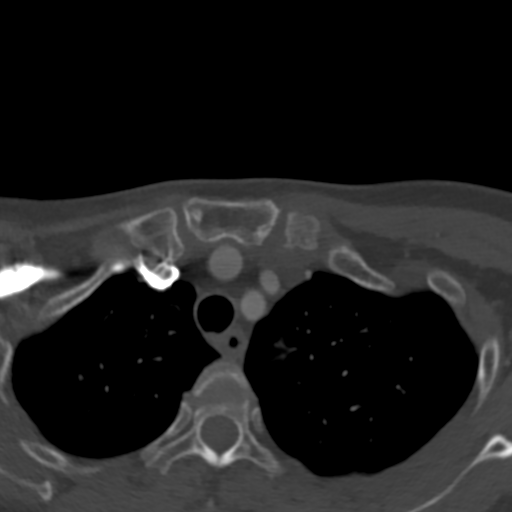
[im 124/657  soft-tissue]
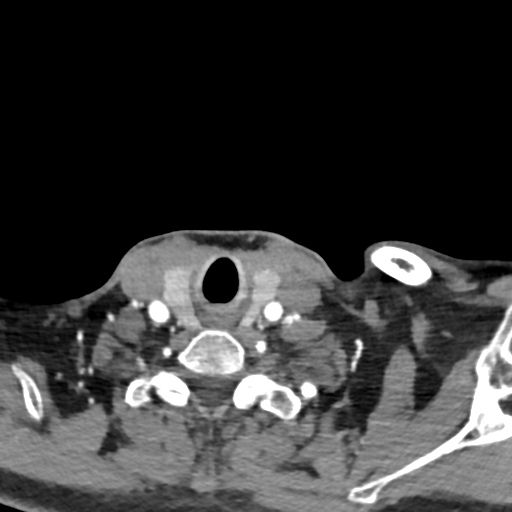
[im 165/657  soft-tissue]
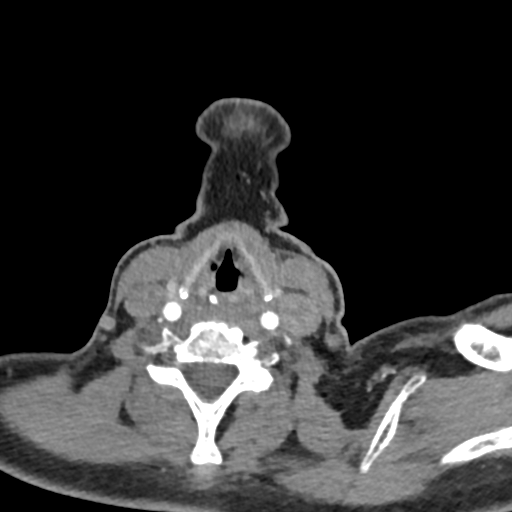
[im 247/657  soft-tissue]
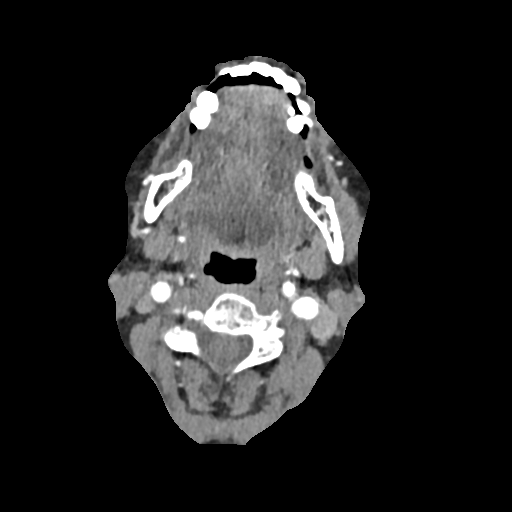
[im 288/657  soft-tissue]
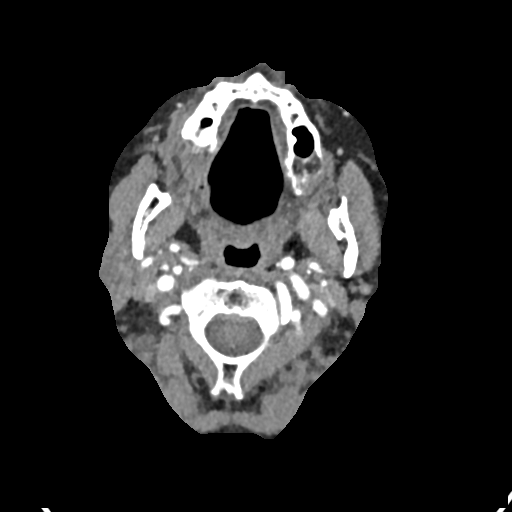
[im 370/657  soft-tissue]
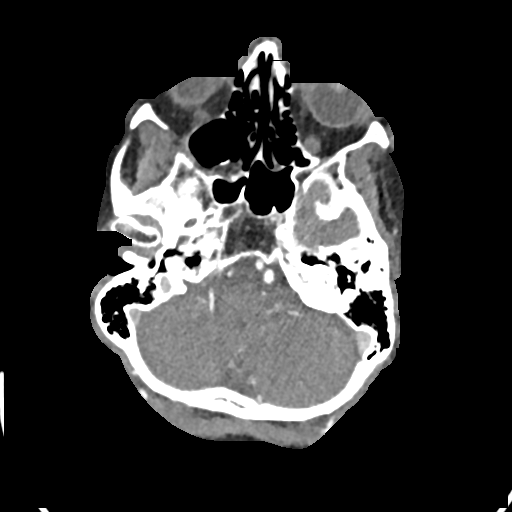
[im 411/657  soft-tissue]
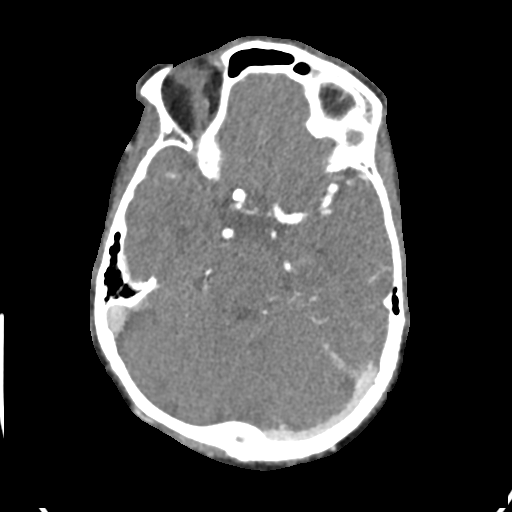
[im 493/657  soft-tissue]
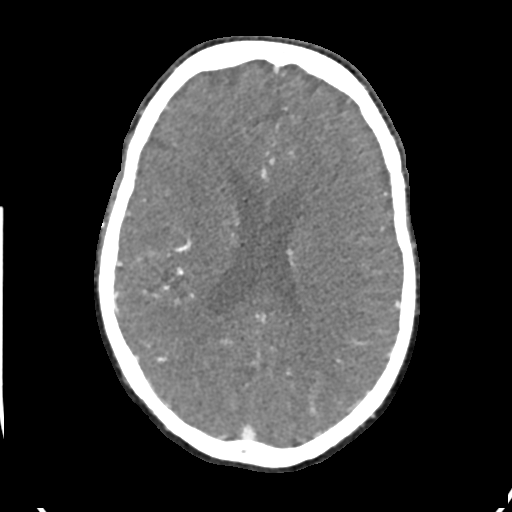
[im 493/657  lung]
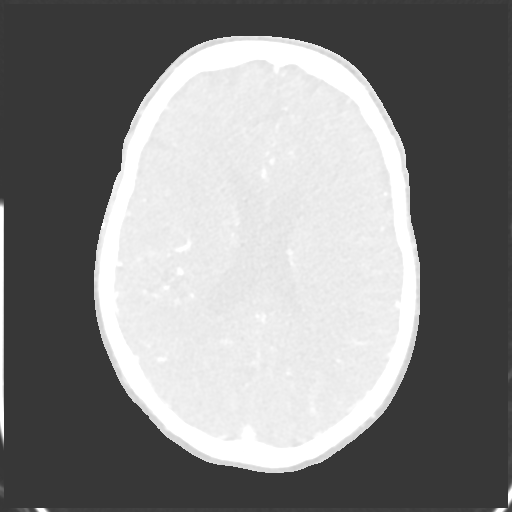
[im 534/657  soft-tissue]
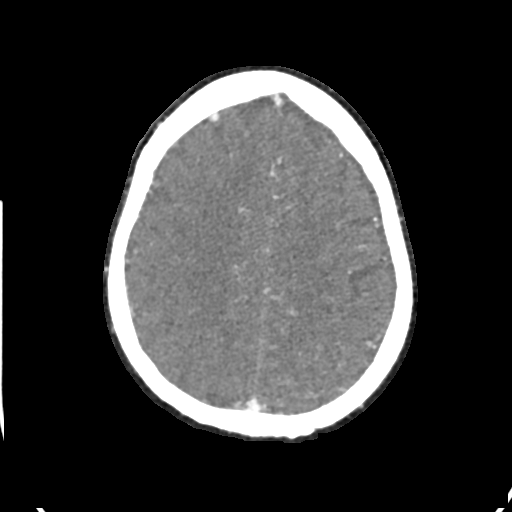
[im 534/657  lung]
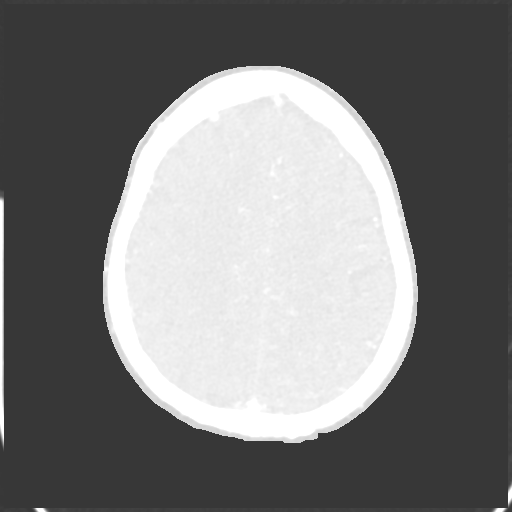
[im 534/657  bone]
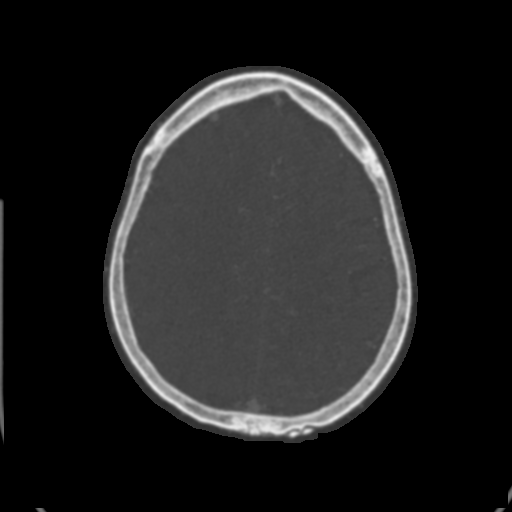
[im 575/657  lung]
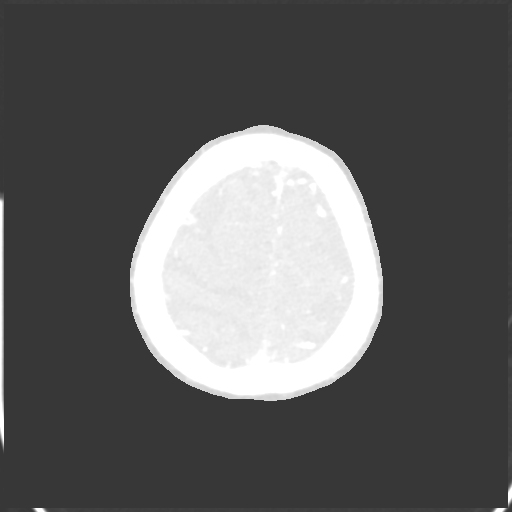
[im 616/657  soft-tissue]
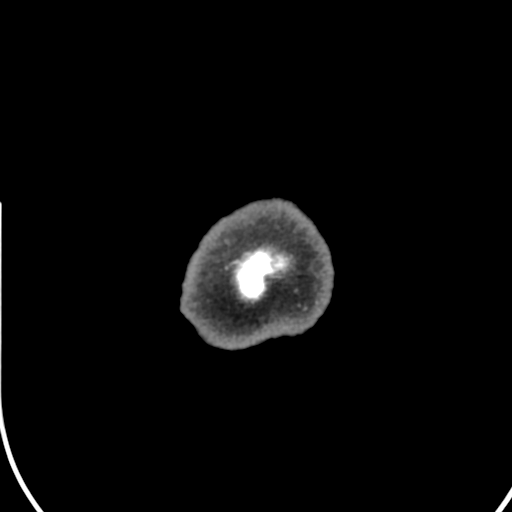
[im 616/657  lung]
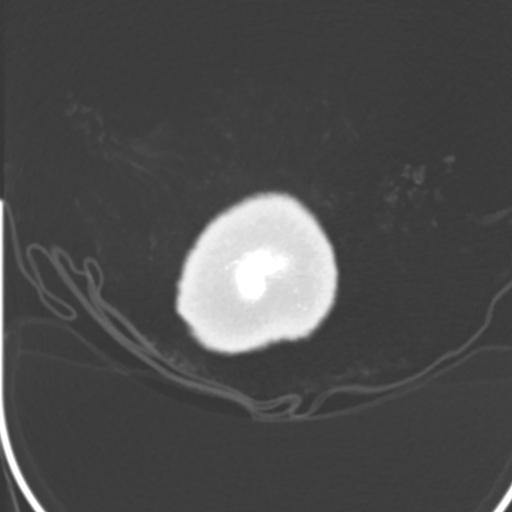

[11 of 46 positions shown; findings below may reference images not displayed]

FINDINGS: CTA NECK FINDINGS

Aortic arch: Visualized aortic arch of normal caliber. Origin of the
great vessels incompletely visualized on this exam. Visualized
subclavian arteries widely patent.

Right carotid system: Right common carotid artery patent from its
origin to the bifurcation without stenosis. No significant
atheromatous change about the right bifurcation. Right ICA mildly
tortuous but patent to the skull base without stenosis, dissection
or occlusion.

Left carotid system: Visualized left CCA patent to the bifurcation
without stenosis. Minor atheromatous change about the left
bifurcation without significant stenosis. Left ICA mildly tortuous
but widely patent distally to the skull base without stenosis,
dissection or occlusion.

Vertebral arteries: Both vertebral arteries arise from the
subclavian arteries. Left vertebral artery dominant. Small focal
calcified plaque noted at the origin of the right vertebral artery
without significant stenosis. Vertebral arteries otherwise mildly
tortuous but widely patent to the skull base without stenosis,
dissection or occlusion.

Skeleton: No acute osseous abnormality. No discrete lytic or blastic
osseous lesions. Moderate cervical spondylosis noted at C5-6.

Other neck: No other acute soft tissue abnormality within the neck.
Subtle heterogeneous left thyroid nodule measures up to
approximately 15 mm (series 5, image 23). Few scattered
subcentimeter nodes measuring up to 7 mm noted within the left
supraclavicular region. No pathologically enlarged lymph nodes. Left
parotid gland appears to be absent.

Upper chest: Visualized upper chest demonstrates no acute finding.
Centrilobular emphysema.

Review of the MIP images confirms the above findings

CTA HEAD FINDINGS

Anterior circulation: Petrous segments patent bilaterally. Calcified
atheromatous plaque throughout the carotid siphons with associated
moderate multifocal narrowing (up to approximately 50% bilaterally).
5 mm saccular aneurysm at the level of the left posterior
communicating artery is seen (series 9, image 90). This is directed
posteriorly and slightly inferiorly. ICA termini well perfused. A1
segments patent bilaterally. Normal anterior communicating artery
complex. Anterior cerebral arteries patent to their distal aspects
without high-grade stenosis. Mild irregularity within the M1
segments bilaterally without high-grade stenosis. Normal MCA
bifurcations. Extensive small vessel atheromatous irregularity
throughout the MCA branches bilaterally with associated moderate to
severe multifocal stenoses, slightly worse on the left. No large
vessel occlusion within the anterior circulation.

Posterior circulation: Calcified plaque involving the proximal V4
segments bilaterally with associated mild short-segment stenoses.
Right PICA patent. Left PICA not definitely seen. Short-segment mild
stenosis noted at the proximal basilar artery. Basilar otherwise
widely patent to its distal aspect. Superior cerebral arteries
patent bilaterally. Both PCAs primarily supplied via the basilar.
Short-segment severe near occlusive left P1 stenosis (series 8,
image 20). Additional multifocal severe left P2 stenoses seen
distally. Moderate to severe segmental stenoses seen within the
right P2 segment as well. PCAs remain patent to their distal
aspects.

Venous sinuses: Grossly patent allowing for timing the contrast
bolus.

Anatomic variants: None significant.

Review of the MIP images confirms the above findings
IMPRESSION: 1. Negative CTA for large vessel occlusion.
2. Moderate to advanced intracranial atherosclerotic disease as
above. Notable findings include severe left P1 and bilateral P2
stenoses, with moderate stenoses about the carotid siphons
bilaterally. Extensive small vessel atheromatous irregularity, most
pronounced about the left MCA and bilateral PCA distributions.
3. 5 mm left PCOM aneurysm.
4.  Emphysema ([MD]-[MD]).
5. Approximate 15 mm left thyroid nodule. Further assessment with
dedicated thyroid ultrasound recommended for further evaluation.
(Ref: [HOSPITAL]. [DATE]): 143-50).

## 2019-10-26 MED ORDER — SODIUM CHLORIDE 0.9% FLUSH: 3.0000 mL | Freq: Once | INTRAVENOUS | Status: DC

## 2019-10-26 MED ORDER — STROKE: EARLY STAGES OF RECOVERY BOOK
Freq: Once | Status: AC
  Filled 2019-10-26 (×2): qty 1

## 2019-10-26 MED ORDER — ASPIRIN 300 MG RE SUPP: 300.0000 mg | Freq: Every day | RECTAL | Status: DC

## 2019-10-26 MED ORDER — ENOXAPARIN SODIUM 40 MG/0.4ML ~~LOC~~ SOLN
40.0000 mg | SUBCUTANEOUS | Status: DC
  Administered 2019-10-26 – 2019-10-27 (×2): 40 mg via SUBCUTANEOUS
  Filled 2019-10-26 (×2): qty 0.4

## 2019-10-26 MED ORDER — ACETAMINOPHEN 160 MG/5ML PO SOLN: 650.0000 mg | ORAL | Status: DC | PRN

## 2019-10-26 MED ORDER — IOHEXOL 350 MG/ML SOLN
75.0000 mL | Freq: Once | INTRAVENOUS | Status: AC | PRN
  Administered 2019-10-26: 75 mL via INTRAVENOUS

## 2019-10-26 MED ORDER — ATORVASTATIN CALCIUM 40 MG PO TABS
40.0000 mg | ORAL_TABLET | Freq: Every day | ORAL | Status: DC
  Administered 2019-10-26 – 2019-10-28 (×3): 40 mg via ORAL
  Filled 2019-10-26 (×3): qty 1

## 2019-10-26 MED ORDER — ACETAMINOPHEN 325 MG PO TABS: 650.0000 mg | ORAL_TABLET | ORAL | Status: DC | PRN

## 2019-10-26 MED ORDER — LABETALOL HCL 5 MG/ML IV SOLN: 5.0000 mg | INTRAVENOUS | Status: DC | PRN

## 2019-10-26 MED ORDER — ACETAMINOPHEN 650 MG RE SUPP: 650.0000 mg | RECTAL | Status: DC | PRN

## 2019-10-26 MED ORDER — ASPIRIN 325 MG PO TABS
325.0000 mg | ORAL_TABLET | Freq: Every day | ORAL | Status: DC
  Administered 2019-10-26: 325 mg via ORAL
  Filled 2019-10-26: qty 1

## 2019-10-26 MED ORDER — SENNOSIDES-DOCUSATE SODIUM 8.6-50 MG PO TABS: 1.0000 | ORAL_TABLET | Freq: Every evening | ORAL | Status: DC | PRN

## 2019-10-26 NOTE — H&P (Signed)
History and Physical    Jashley Yellin UJW:[id number] DOB: 12-25-1955 DOA: 10/26/2019  PCP: Daisy Floro, MD  Patient coming from: Home  I have personally briefly reviewed patient's old medical records in [facility]  Chief Complaint: Dizziness  HPI: Kodi Guerrera Herst is a 64 y.o. female with medical history significant for (EF 30-35% by TTE 07/16/2019), hypertension, and CKD stage III who presents to the ED for evaluation of dizziness.  Patient has been following closely with pharmacy to optimize hypertensive and cardiac medications.  She was transitioned off of clonidine patch to BiDil 10/24/2019.  She says yesterday morning (11/21/2019) around 10:30 AM after she went to the bathroom she was looking out the window of her home and that the next day she remembers is waking up on the floor.  She is not sure how long she was down but thinks she hit her head due to some pain at her left forehead.  She felt dizzy/off balance without a room spinning sensation.  She has been having double vision when she looks to the side as well as blurred vision.  She reports intermittent palpitations.  She denied any loss of bowel or bladder control.  She has not had any myalgias.  She reports chronic tingling sensation in the medial aspect of her left arm with decreased external rotation at the shoulder due to an injury a few months ago but otherwise denies any new weakness in her extremities or change in sensation.  She denies any chest pain, dyspnea, nausea, vomiting, abdominal pain, diarrhea, or dysuria.  She reports having one similar episode 30 years ago in which she had a near syncopal event.  She has been taking aspirin 81 mg daily.  She currently lives alone.  ED Course:  Initial vitals showed BP 179/109, pulse 71, RR 18, temp 98.6 Fahrenheit, SPO2 90% on room air.  Labs are notable for WBC 6.3, hemoglobin 13.6, platelets 194,000, sodium 141, potassium 4.2, bicarb 25, BUN  24, creatinine 1.2, serum glucose 111.  Urinalysis is negative for UTI.  SARS-CoV-2 PCR is ordered and pending.  MRI brain without contrast shows 2 small areas of acute infarct in the left frontal lobe and moderate chronic microvascular ischemic changes.  Scattered areas of chronic microhemorrhage in the brain compatible with chronic hypertension are noticed.  EDP discussed the case with on-call neurology who recommended medical admission at [facility] for further evaluation and management.  The hospitalist service was consulted to admit for further evaluation and management.  Review of Systems: All systems reviewed and are negative except as documented in history of present illness above.   Past Medical History:  Diagnosis Date  . Abnormal radiographic examination   . CHF (congestive heart failure) (HCC)   . Chronic kidney disease, stage III (moderate)   . Chronic kidney disease, stage III (moderate)   . CN (constipation)   . Essential hypertension, malignant   . HTN (hypertension)   . LVH (left ventricular hypertrophy)   . Protein-calorie malnutrition (HCC)     Past Surgical History:  Procedure Laterality Date  . BREAST EXCISIONAL BIOPSY Left     Social History:  reports that she has quit smoking. She does not have any smokeless tobacco history on file. No history on file for alcohol and drug.  No Known Allergies  Family History  Problem Relation Age of Onset  . Breast cancer Mother   . Breast cancer Maternal Aunt   . Stroke Father   .  Kidney disease Father      Prior to Admission medications   Medication Sig Start Date End Date Taking? Authorizing Provider  amLODipine (NORVASC) 10 MG tablet Take 10 mg by mouth daily.   Yes [provider]  aspirin EC 81 MG tablet Take 81 mg by mouth daily.   Yes [provider]  BIDIL 20-37.5 MG tablet Take 1 tablet by mouth 3 (three) times daily. 10/22/19  Yes Jake Bathe, MD  carvedilol (COREG) 25 MG  tablet Take 1 tablet (25 mg total) by mouth 2 (two) times daily. 09/17/19  Yes Jake Bathe, MD  Multiple Vitamin (MULTIVITAMIN) tablet Take 1 tablet by mouth daily.   Yes [provider]  sacubitril-valsartan (ENTRESTO) 97-103 MG Take 1 tablet by mouth 2 (two) times daily. 08/19/19  Yes Jake Bathe, MD    Physical Exam: Vitals:   10/26/19 1700 10/26/19 1730 10/26/19 1940 10/26/19 2001  BP: (!) 166/99 (!) 166/97 (!) 189/111 (!) 174/117  Pulse: 67  69 78  Resp: 19 16 (!) 21 17  Temp:      TempSrc:      SpO2: 100%  98% 99%  Weight:   53.5 kg   Height:   5' 4.5" (1.638 m)    Constitutional: Thin elderly woman resting in bed, NAD, calm, comfortable Eyes: PERRL, EOMI, lids and conjunctivae normal ENMT: Mucous membranes are moist. Posterior pharynx clear of any exudate or lesions.Normal dentition.  Neck: normal, supple, no masses. Respiratory: clear to auscultation bilaterally, no wheezing, no crackles. Normal respiratory effort. No accessory muscle use.  Cardiovascular: Regular rate and rhythm, no murmurs / rubs / gallops. No extremity edema. 2+ pedal pulses. Abdomen: no tenderness, no masses palpated. No hepatosplenomegaly. Bowel sounds positive.  Musculoskeletal: no clubbing / cyanosis. No joint deformity upper and lower extremities. Good ROM, no contractures. Normal muscle tone.  Skin: no rashes, lesions, ulcers. No induration Neurologic: CN 2-12 grossly intact. Sensation intact, Strength 5/5 in all 4.  Psychiatric: Normal judgment and insight. Alert and oriented x 3. Normal mood.   Labs on Admission: I have personally reviewed following labs and imaging studies  CBC: Recent Labs  Lab 10/26/19 1548  WBC 6.3  HGB 13.6  HCT 41.7  MCV 88.2  PLT 194   Basic Metabolic Panel: Recent Labs  Lab 10/26/19 1548  NA 141  K 4.2  CL 106  CO2 25  GLUCOSE 111*  BUN 24*  CREATININE 1.20*  CALCIUM 9.9   GFR: Estimated Creatinine Clearance: 39.5 mL/min (A) (by C-G  formula based on SCr of 1.2 mg/dL (H)). Liver Function Tests: No results for input(s): AST, ALT, ALKPHOS, BILITOT, PROT, ALBUMIN in the last 168 hours. No results for input(s): LIPASE, AMYLASE in the last 168 hours. No results for input(s): AMMONIA in the last 168 hours. Coagulation Profile: No results for input(s): INR, PROTIME in the last 168 hours. Cardiac Enzymes: No results for input(s): CKTOTAL, CKMB, CKMBINDEX, TROPONINI in the last 168 hours. BNP (last 3 results) No results for input(s): PROBNP in the last 8760 hours. HbA1C: No results for input(s): HGBA1C in the last 72 hours. CBG: Recent Labs  Lab 10/26/19 1551  GLUCAP 98   Lipid Profile: No results for input(s): CHOL, HDL, LDLCALC, TRIG, CHOLHDL, LDLDIRECT in the last 72 hours. Thyroid Function Tests: No results for input(s): TSH, T4TOTAL, FREET4, T3FREE, THYROIDAB in the last 72 hours. Anemia Panel: No results for input(s): VITAMINB12, FOLATE, FERRITIN, TIBC, IRON, RETICCTPCT in the last 72  hours. Urine analysis:    Component Value Date/Time   COLORURINE YELLOW 10/26/2019 1602   APPEARANCEUR CLEAR 10/26/2019 1602   LABSPEC 1.012 10/26/2019 1602   PHURINE 7.0 10/26/2019 1602   GLUCOSEU NEGATIVE 10/26/2019 1602   HGBUR NEGATIVE 10/26/2019 1602   BILIRUBINUR NEGATIVE 10/26/2019 1602   KETONESUR NEGATIVE 10/26/2019 1602   PROTEINUR 100 (A) 10/26/2019 1602   UROBILINOGEN 0.2 07/22/2008 1714   NITRITE NEGATIVE 10/26/2019 1602   LEUKOCYTESUR NEGATIVE 10/26/2019 1602    Radiological Exams on Admission: MR BRAIN WO CONTRAST  Result Date: 10/26/2019 CLINICAL DATA:  Diplopia.  Hypertension. EXAM: MRI HEAD WITHOUT CONTRAST TECHNIQUE: Multiplanar, multiecho pulse sequences of the brain and surrounding structures were obtained without intravenous contrast. COMPARISON:  None. FINDINGS: Brain: Small foci of acute infarct in the left anterior insular cortex and the left lateral frontal lobe involving the cortex. Both of these  infarcts are approximately 3 mm in size. Moderate chronic microvascular ischemic change in the white matter. Chronic ischemic change in the basal ganglia and pons. Small chronic infarct right cerebellum. Scattered areas of microhemorrhage including the left cerebellum, left thalamus, right occipital parietal lobe. Vascular: Normal arterial flow voids Skull and upper cervical spine: Negative Sinuses/Orbits: Negative Other: None IMPRESSION: 1. 2 small areas of acute infarct in the left frontal lobe. 2. Moderate chronic microvascular ischemic change. Scattered areas of chronic microhemorrhage in the brain compatible with chronic hypertension. Electronically Signed   By: Marlan Palau M.D.   On: 10/26/2019 18:32    EKG: Independently reviewed. Normal sinus rhythm without acute ischemic changes.  Compared to prior, bigeminal PVCs no longer present.  Assessment/Plan Principal Problem:   Acute cerebrovascular accident (CVA) (HCC) Active Problems:   Essential hypertension   HFrEF (heart failure with reduced ejection fraction) (HCC)   Chronic kidney disease, stage III (moderate)  Emalie McDougald Heinle is a 64 y.o. female with medical history significant for (EF 30-35% by TTE 07/16/2019), hypertension, and CKD stage III who is admitted with acute nonhemorrhagic stroke.  Acute CVA Syncopal episode: MRI brain showing 2 small areas of acute infarct in the left frontal lobe.  Possibly had hypotensive episode resulting in syncope and decreased cerebral perfusion.  Cardioembolic source also in differential given LV global hypokinesis.  Denies any seizure-like activity or postictal symptoms.  EKG shows sinus rhythm at admission however prior shows bigeminal PVCs.  She has been on aspirin 81 mg daily at home. -Admit to [facility] for neurology/stroke team evaluation -Monitor on telemetry, neurochecks -Update echocardiogram and carotid Dopplers -Place on aspirin 325 mg daily -Start atorvastatin 40  mg daily -PT/OT/SLP eval -Check A1c, lipid panel -Allow permissive hypertension up to 220/120  HFrEF: EF 30-35%, G1DD, LV global hypokinesis by TTE 07/22/2019.  She appears euvolemic and well compensated.  She is not requiring outpatient diuretics. -Home Coreg, Entresto, amlodipine, BiDil on hold to allow for permissive hypertension -Strict I/O's, daily weights  Hypertension: Home antihypertensives on hold to allow for permissive hypertension as above.  CKD stage III: Chronic and stable.  Continue to monitor.  DVT prophylaxis: Lovenox Code Status: Full code, confirmed with patient Family Communication: Discussed with patient, she has discussed with family Disposition Plan: From home.  Admit to [facility] for neurology/stroke team evaluation. Consults called: Neurology Admission status:  Status is: Inpatient  Remains inpatient appropriate because:Ongoing diagnostic testing needed not appropriate for outpatient work up  Dispo: The patient is from: Home  Anticipated d/c is to: Home pending further stroke work-up and PT/OT eval              Anticipated d/c date is: 2 days              Patient currently is not medically stable to d/c.  Zada Finders MD [facility]  If 7PM-7AM, please contact night-coverage [email or web address]  10/26/2019, 8:28 PM

## 2019-10-26 NOTE — Telephone Encounter (Signed)
Called patient to see how she was feeling today. She had left VM on machine at 6:15PM last night after we left the office that she was still feeling dizzy and wanted to know if she should take her evening medications. I called patient this AM. She states she is still feeling a little dizzy. When she looks to the side she can see fine, but when she looks straight with her glasses on she sees double. She states she hit her head when she fell. She had a headache yesterday, but no headaches today. I advised that she call her PCP office and tell them what happened and see if they can evaluate her today. Advised that she hold off on her BP medications for now (has not taken yet this AM). hasn't taken BP bc she cant focus on machine. Sister can drive her if she needs to go to PCP office. I will call her this afternoon to follow up.

## 2019-10-26 NOTE — ED Notes (Signed)
[facility] called for transport. 

## 2019-10-26 NOTE — ED Notes (Signed)
PTAR at bedside 

## 2019-10-26 NOTE — ED Provider Notes (Signed)
[facility]-EMERGENCY DEPT Provider Note   CSN: [id number] Arrival date & time: 10/26/19  1535     History Chief Complaint  Patient presents with  . Loss of Consciousness  . Dizziness    Kendra Richardson is a 64 y.o. female.  HPI   63yF with dizziness. Reports that yesterday morning she thinks she passed out. She remembers looking out her front window to see what the weather was like then woke up on the floor. She thinks she may have hit her head because it feels sore to touch in the L frontal region. Since then she has felt dizzy. She has a hard time describing it beyond this. Denies room spinning sensation though. Also had vertical diplopia and blurred vision yesterday. Diplopia has resolved. Blurriness has improved but still present. HTN medications recently changed and she wonders if symptoms could be related.   Past Medical History:  Diagnosis Date  . Abnormal radiographic examination   . CHF (congestive heart failure) (HCC)   . Chronic kidney disease, stage III (moderate)   . Chronic kidney disease, stage III (moderate)   . CN (constipation)   . Essential hypertension, malignant   . HTN (hypertension)   . LVH (left ventricular hypertrophy)   . Protein-calorie malnutrition [facility])     Patient Active Problem List   Diagnosis Date Noted  . HFrEF (heart failure with reduced ejection fraction) (HCC) 08/05/2019  . Uncontrolled hypertension 07/07/2019  . Nonspecific abnormal electrocardiogram (ECG) (EKG) 07/07/2019    Past Surgical History:  Procedure Laterality Date  . BREAST EXCISIONAL BIOPSY Left      OB History   No obstetric history on file.     Family History  Problem Relation Age of Onset  . Breast cancer Mother   . Breast cancer Maternal Aunt     Social History   Tobacco Use  . Smoking status: Former Smoker  Substance Use Topics  . Alcohol use: Not on file  . Drug use: Not on file    Home Medications Prior to  Admission medications   Medication Sig Start Date End Date Taking? Authorizing Provider  amLODipine (NORVASC) 10 MG tablet Take 10 mg by mouth daily.    [provider]  aspirin EC 81 MG tablet Take 81 mg by mouth daily.    [provider]  BIDIL 20-37.5 MG tablet Take 1 tablet by mouth 3 (three) times daily. 10/22/19   Jake Bathe, MD  carvedilol (COREG) 25 MG tablet Take 1 tablet (25 mg total) by mouth 2 (two) times daily. 09/17/19   Jake Bathe, MD  Multiple Vitamin (MULTIVITAMIN) tablet Take 1 tablet by mouth daily.    [provider]  sacubitril-valsartan (ENTRESTO) 97-103 MG Take 1 tablet by mouth 2 (two) times daily. 08/19/19   Jake Bathe, MD    Allergies    Patient has no allergy information on record.  Review of Systems   Review of Systems All systems reviewed and negative, other than as noted in HPI.  Physical Exam Updated Vital Signs BP (!) 179/109 (BP Location: Left Arm)   Pulse 71   Temp 98.6 F (37 C) (Oral)   Resp 18   SpO2 98%   Physical Exam Vitals and nursing note reviewed.  Constitutional:      General: She is not in acute distress.    Appearance: She is well-developed.  HENT:     Head: Normocephalic and atraumatic.  Eyes:     General:  Right eye: No discharge.        Left eye: No discharge.     Conjunctiva/sclera: Conjunctivae normal.  Cardiovascular:     Rate and Rhythm: Normal rate and regular rhythm.     Heart sounds: Normal heart sounds. No murmur. No friction rub. No gallop.   Pulmonary:     Effort: Pulmonary effort is normal. No respiratory distress.     Breath sounds: Normal breath sounds.  Abdominal:     General: There is no distension.     Palpations: Abdomen is soft.     Tenderness: There is no abdominal tenderness.  Musculoskeletal:        General: No tenderness.     Cervical back: Neck supple.  Skin:    General: Skin is warm and dry.  Neurological:     General: No focal deficit present.      Mental Status: She is alert and oriented to person, place, and time.     Cranial Nerves: No cranial nerve deficit.     Sensory: No sensory deficit.     Motor: No weakness.     Coordination: Coordination normal.  Psychiatric:        Behavior: Behavior normal.        Thought Content: Thought content normal.     ED Results / Procedures / Treatments   Labs (all labs ordered are listed, but only abnormal results are displayed) Labs Reviewed  BASIC METABOLIC PANEL - Abnormal; Notable for the following components:      Result Value   Glucose, Bld 111 (*)    BUN 24 (*)    Creatinine, Ser 1.20 (*)    GFR calc non Af Amer 47 (*)    GFR calc Af Amer 55 (*)    All other components within normal limits  URINALYSIS, ROUTINE W REFLEX MICROSCOPIC - Abnormal; Notable for the following components:   Protein, ur 100 (*)    All other components within normal limits  BASIC METABOLIC PANEL - Abnormal; Notable for the following components:   Creatinine, Ser 1.14 (*)    GFR calc non Af Amer 50 (*)    GFR calc Af Amer 58 (*)    All other components within normal limits  BASIC METABOLIC PANEL - Abnormal; Notable for the following components:   Glucose, Bld 104 (*)    Creatinine, Ser 1.25 (*)    GFR calc non Af Amer 45 (*)    GFR calc Af Amer 52 (*)    All other components within normal limits  LIPID PANEL - Abnormal; Notable for the following components:   Cholesterol 207 (*)    LDL Cholesterol 101 (*)    All other components within normal limits  RESPIRATORY PANEL BY RT PCR (FLU A&B, COVID)  CBC  HIV ANTIBODY (ROUTINE TESTING W REFLEX)  CBC  HEMOGLOBIN A1C  CBG MONITORING, ED    EKG EKG Interpretation  Date/Time:  Tuesday Oct 26 2019 15:48:09 EDT Ventricular Rate:  74 PR Interval:    QRS Duration: 93 QT Interval:  390 QTC Calculation: 433 R Axis:   37 Text Interpretation: Sinus rhythm Nonspecific T abnormalities, lateral leads Confirmed by Raeford Razor [phone]) on 10/26/2019 3:56:43  PM   Radiology CT ANGIO HEAD W OR WO CONTRAST  Result Date: 10/27/2019 CLINICAL DATA:  Follow-up examination for acute stroke. EXAM: CT ANGIOGRAPHY HEAD AND NECK TECHNIQUE: Multidetector CT imaging of the head and neck was performed using the standard protocol during bolus administration of intravenous  contrast. Multiplanar CT image reconstructions and MIPs were obtained to evaluate the vascular anatomy. Carotid stenosis measurements (when applicable) are obtained utilizing NASCET criteria, using the distal internal carotid diameter as the denominator. CONTRAST:  75mL OMNIPAQUE IOHEXOL 350 MG/ML SOLN COMPARISON:  Prior MRI from 10/26/2019. FINDINGS: CTA NECK FINDINGS Aortic arch: Visualized aortic arch of normal caliber. Origin of the great vessels incompletely visualized on this exam. Visualized subclavian arteries widely patent. Right carotid system: Right common carotid artery patent from its origin to the bifurcation without stenosis. No significant atheromatous change about the right bifurcation. Right ICA mildly tortuous but patent to the skull base without stenosis, dissection or occlusion. Left carotid system: Visualized left CCA patent to the bifurcation without stenosis. Minor atheromatous change about the left bifurcation without significant stenosis. Left ICA mildly tortuous but widely patent distally to the skull base without stenosis, dissection or occlusion. Vertebral arteries: Both vertebral arteries arise from the subclavian arteries. Left vertebral artery dominant. Small focal calcified plaque noted at the origin of the right vertebral artery without significant stenosis. Vertebral arteries otherwise mildly tortuous but widely patent to the skull base without stenosis, dissection or occlusion. Skeleton: No acute osseous abnormality. No discrete lytic or blastic osseous lesions. Moderate cervical spondylosis noted at C5-6. Other neck: No other acute soft tissue abnormality within the neck.  Subtle heterogeneous left thyroid nodule measures up to approximately 15 mm (series 5, image 23). Few scattered subcentimeter nodes measuring up to 7 mm noted within the left supraclavicular region. No pathologically enlarged lymph nodes. Left parotid gland appears to be absent. Upper chest: Visualized upper chest demonstrates no acute finding. Centrilobular emphysema. Review of the MIP images confirms the above findings CTA HEAD FINDINGS Anterior circulation: Petrous segments patent bilaterally. Calcified atheromatous plaque throughout the carotid siphons with associated moderate multifocal narrowing (up to approximately 50% bilaterally). 5 mm saccular aneurysm at the level of the left posterior communicating artery is seen (series 9, image 90). This is directed posteriorly and slightly inferiorly. ICA termini well perfused. A1 segments patent bilaterally. Normal anterior communicating artery complex. Anterior cerebral arteries patent to their distal aspects without high-grade stenosis. Mild irregularity within the M1 segments bilaterally without high-grade stenosis. Normal MCA bifurcations. Extensive small vessel atheromatous irregularity throughout the MCA branches bilaterally with associated moderate to severe multifocal stenoses, slightly worse on the left. No large vessel occlusion within the anterior circulation. Posterior circulation: Calcified plaque involving the proximal V4 segments bilaterally with associated mild short-segment stenoses. Right PICA patent. Left PICA not definitely seen. Short-segment mild stenosis noted at the proximal basilar artery. Basilar otherwise widely patent to its distal aspect. Superior cerebral arteries patent bilaterally. Both PCAs primarily supplied via the basilar. Short-segment severe near occlusive left P1 stenosis (series 8, image 20). Additional multifocal severe left P2 stenoses seen distally. Moderate to severe segmental stenoses seen within the right P2 segment as  well. PCAs remain patent to their distal aspects. Venous sinuses: Grossly patent allowing for timing the contrast bolus. Anatomic variants: None significant. Review of the MIP images confirms the above findings IMPRESSION: 1. Negative CTA for large vessel occlusion. 2. Moderate to advanced intracranial atherosclerotic disease as above. Notable findings include severe left P1 and bilateral P2 stenoses, with moderate stenoses about the carotid siphons bilaterally. Extensive small vessel atheromatous irregularity, most pronounced about the left MCA and bilateral PCA distributions. 3. 5 mm left PCOM aneurysm. 4.  Emphysema (ICD10-J43.9). 5. Approximate 15 mm left thyroid nodule. Further assessment with dedicated thyroid ultrasound recommended for  further evaluation. (Ref: J Am Coll Radiol. 2015 Feb;12(2): 143-50). Electronically Signed   By: Rise Mu M.D.   On: 10/27/2019 03:17   CT ANGIO NECK W OR WO CONTRAST  Result Date: 10/27/2019 CLINICAL DATA:  Follow-up examination for acute stroke. EXAM: CT ANGIOGRAPHY HEAD AND NECK TECHNIQUE: Multidetector CT imaging of the head and neck was performed using the standard protocol during bolus administration of intravenous contrast. Multiplanar CT image reconstructions and MIPs were obtained to evaluate the vascular anatomy. Carotid stenosis measurements (when applicable) are obtained utilizing NASCET criteria, using the distal internal carotid diameter as the denominator. CONTRAST:  9mL OMNIPAQUE IOHEXOL 350 MG/ML SOLN COMPARISON:  Prior MRI from 10/26/2019. FINDINGS: CTA NECK FINDINGS Aortic arch: Visualized aortic arch of normal caliber. Origin of the great vessels incompletely visualized on this exam. Visualized subclavian arteries widely patent. Right carotid system: Right common carotid artery patent from its origin to the bifurcation without stenosis. No significant atheromatous change about the right bifurcation. Right ICA mildly tortuous but patent to the  skull base without stenosis, dissection or occlusion. Left carotid system: Visualized left CCA patent to the bifurcation without stenosis. Minor atheromatous change about the left bifurcation without significant stenosis. Left ICA mildly tortuous but widely patent distally to the skull base without stenosis, dissection or occlusion. Vertebral arteries: Both vertebral arteries arise from the subclavian arteries. Left vertebral artery dominant. Small focal calcified plaque noted at the origin of the right vertebral artery without significant stenosis. Vertebral arteries otherwise mildly tortuous but widely patent to the skull base without stenosis, dissection or occlusion. Skeleton: No acute osseous abnormality. No discrete lytic or blastic osseous lesions. Moderate cervical spondylosis noted at C5-6. Other neck: No other acute soft tissue abnormality within the neck. Subtle heterogeneous left thyroid nodule measures up to approximately 15 mm (series 5, image 23). Few scattered subcentimeter nodes measuring up to 7 mm noted within the left supraclavicular region. No pathologically enlarged lymph nodes. Left parotid gland appears to be absent. Upper chest: Visualized upper chest demonstrates no acute finding. Centrilobular emphysema. Review of the MIP images confirms the above findings CTA HEAD FINDINGS Anterior circulation: Petrous segments patent bilaterally. Calcified atheromatous plaque throughout the carotid siphons with associated moderate multifocal narrowing (up to approximately 50% bilaterally). 5 mm saccular aneurysm at the level of the left posterior communicating artery is seen (series 9, image 90). This is directed posteriorly and slightly inferiorly. ICA termini well perfused. A1 segments patent bilaterally. Normal anterior communicating artery complex. Anterior cerebral arteries patent to their distal aspects without high-grade stenosis. Mild irregularity within the M1 segments bilaterally without  high-grade stenosis. Normal MCA bifurcations. Extensive small vessel atheromatous irregularity throughout the MCA branches bilaterally with associated moderate to severe multifocal stenoses, slightly worse on the left. No large vessel occlusion within the anterior circulation. Posterior circulation: Calcified plaque involving the proximal V4 segments bilaterally with associated mild short-segment stenoses. Right PICA patent. Left PICA not definitely seen. Short-segment mild stenosis noted at the proximal basilar artery. Basilar otherwise widely patent to its distal aspect. Superior cerebral arteries patent bilaterally. Both PCAs primarily supplied via the basilar. Short-segment severe near occlusive left P1 stenosis (series 8, image 20). Additional multifocal severe left P2 stenoses seen distally. Moderate to severe segmental stenoses seen within the right P2 segment as well. PCAs remain patent to their distal aspects. Venous sinuses: Grossly patent allowing for timing the contrast bolus. Anatomic variants: None significant. Review of the MIP images confirms the above findings IMPRESSION: 1. Negative CTA  for large vessel occlusion. 2. Moderate to advanced intracranial atherosclerotic disease as above. Notable findings include severe left P1 and bilateral P2 stenoses, with moderate stenoses about the carotid siphons bilaterally. Extensive small vessel atheromatous irregularity, most pronounced about the left MCA and bilateral PCA distributions. 3. 5 mm left PCOM aneurysm. 4.  Emphysema (ICD10-J43.9). 5. Approximate 15 mm left thyroid nodule. Further assessment with dedicated thyroid ultrasound recommended for further evaluation. (Ref: J Am Coll Radiol. 2015 Feb;12(2): 143-50). Electronically Signed   By: Jeannine Boga M.D.   On: 10/27/2019 03:17   MR BRAIN WO CONTRAST  Result Date: 10/26/2019 CLINICAL DATA:  Diplopia.  Hypertension. EXAM: MRI HEAD WITHOUT CONTRAST TECHNIQUE: Multiplanar, multiecho pulse  sequences of the brain and surrounding structures were obtained without intravenous contrast. COMPARISON:  None. FINDINGS: Brain: Small foci of acute infarct in the left anterior insular cortex and the left lateral frontal lobe involving the cortex. Both of these infarcts are approximately 3 mm in size. Moderate chronic microvascular ischemic change in the white matter. Chronic ischemic change in the basal ganglia and pons. Small chronic infarct right cerebellum. Scattered areas of microhemorrhage including the left cerebellum, left thalamus, right occipital parietal lobe. Vascular: Normal arterial flow voids Skull and upper cervical spine: Negative Sinuses/Orbits: Negative Other: None IMPRESSION: 1. 2 small areas of acute infarct in the left frontal lobe. 2. Moderate chronic microvascular ischemic change. Scattered areas of chronic microhemorrhage in the brain compatible with chronic hypertension. Electronically Signed   By: Franchot Gallo M.D.   On: 10/26/2019 18:32   ECHOCARDIOGRAM COMPLETE  Result Date: 10/27/2019    ECHOCARDIOGRAM REPORT   Patient Name:   Kendra Richardson [facility] Date of Exam: 10/27/2019 Medical Rec #:  [id number]                    Height:       64.5 in Accession #:    [id number]                   Weight:       119.7 lb Date of Birth:  1955-08-23                   BSA:          1.581 m Patient Age:    72 years                     BP:           169/107 mmHg Patient Gender: F                            HR:           66 bpm. Exam Location:  Inpatient Procedure: 2D Echo, Cardiac Doppler and Color Doppler Indications:    Stroke 434.91  History:        Patient has prior history of Echocardiogram examinations, most                 recent 07/22/2019. CHF, Abnormal ECG, Stroke; Risk                 Factors:Hypertension and Former Smoker.  Sonographer:    Vickie Epley RDCS Referring Phys: [id number] [place]  1. Left ventricular ejection fraction, by estimation, is 50 to 55%. The left  ventricle has low normal function. The left ventricle has no regional wall motion abnormalities. Left ventricular diastolic  parameters are consistent with Grade I diastolic dysfunction (impaired relaxation).  2. Right ventricular systolic function is normal. The right ventricular size is normal. There is normal pulmonary artery systolic pressure.  3. The mitral valve is normal in structure. No evidence of mitral valve regurgitation. No evidence of mitral stenosis.  4. The aortic valve is normal in structure. Aortic valve regurgitation is not visualized. No aortic stenosis is present.  5. The inferior vena cava is normal in size with greater than 50% respiratory variability, suggesting right atrial pressure of 3 mmHg. Conclusion(s)/Recommendation(s): No intracardiac source of embolism detected on this transthoracic study. A transesophageal echocardiogram is recommended to exclude cardiac source of embolism if clinically indicated. FINDINGS  Left Ventricle: Left ventricular ejection fraction, by estimation, is 50 to 55%. The left ventricle has low normal function. The left ventricle has no regional wall motion abnormalities. The left ventricular internal cavity size was normal in size. There is no left ventricular hypertrophy. Left ventricular diastolic parameters are consistent with Grade I diastolic dysfunction (impaired relaxation). Right Ventricle: The right ventricular size is normal. No increase in right ventricular wall thickness. Right ventricular systolic function is normal. There is normal pulmonary artery systolic pressure. The tricuspid regurgitant velocity is 2.27 m/s, and  with an assumed right atrial pressure of 3 mmHg, the estimated right ventricular systolic pressure is 23.6 mmHg. Left Atrium: Left atrial size was normal in size. Right Atrium: Right atrial size was normal in size. Pericardium: There is no evidence of pericardial effusion. Mitral Valve: The mitral valve is normal in structure. Normal  mobility of the mitral valve leaflets. No evidence of mitral valve regurgitation. No evidence of mitral valve stenosis. Tricuspid Valve: The tricuspid valve is normal in structure. Tricuspid valve regurgitation is mild . No evidence of tricuspid stenosis. Aortic Valve: The aortic valve is normal in structure. Aortic valve regurgitation is not visualized. No aortic stenosis is present. Pulmonic Valve: The pulmonic valve was normal in structure. Pulmonic valve regurgitation is not visualized. No evidence of pulmonic stenosis. Aorta: The aortic root is normal in size and structure. Venous: The inferior vena cava is normal in size with greater than 50% respiratory variability, suggesting right atrial pressure of 3 mmHg. IAS/Shunts: There is right bowing of the interatrial septum, suggestive of elevated left atrial pressure. No atrial level shunt detected by color flow Doppler.  LEFT VENTRICLE PLAX 2D LVIDd:         4.30 cm      Diastology LVIDs:         3.20 cm      LV e' lateral:   6.25 cm/s LV PW:         0.80 cm      LV E/e' lateral: 4.4 LV IVS:        0.80 cm      LV e' medial:    3.00 cm/s LVOT diam:     2.00 cm      LV E/e' medial:  9.1 LV SV:         40 LV SV Index:   25 LVOT Area:     3.14 cm  LV Volumes (MOD) LV vol d, MOD A2C: 93.9 ml LV vol d, MOD A4C: 114.0 ml LV vol s, MOD A2C: 44.3 ml LV vol s, MOD A4C: 63.3 ml LV SV MOD A2C:     49.6 ml LV SV MOD A4C:     114.0 ml LV SV MOD BP:      50.0 ml  RIGHT VENTRICLE RV S prime:     10.90 cm/s TAPSE (M-mode): 1.8 cm LEFT ATRIUM           Index       RIGHT ATRIUM           Index LA diam:      3.20 cm 2.02 cm/m  RA Area:     12.30 cm LA Vol (A2C): 34.9 ml 22.07 ml/m RA Volume:   27.10 ml  17.14 ml/m LA Vol (A4C): 34.2 ml 21.63 ml/m  AORTIC VALVE LVOT Vmax:   74.20 cm/s LVOT Vmean:  48.500 cm/s LVOT VTI:    0.127 m  AORTA Ao Root diam: 2.80 cm MITRAL VALVE               TRICUSPID VALVE MV Area (PHT): 2.32 cm    TR Peak grad:   20.6 mmHg MV Decel Time: 327 msec     TR Vmax:        227.00 cm/s MV E velocity: 27.40 cm/s MV A velocity: 49.40 cm/s  SHUNTS MV E/A ratio:  0.55        Systemic VTI:  0.13 m                            Systemic Diam: 2.00 cm Donato SchultzMark Skains MD Electronically signed by Donato SchultzMark Skains MD Signature Date/Time: 10/27/2019/2:47:03 PM    Final     Procedures Procedures (including critical care time)  Medications Ordered in ED Medications  sodium chloride flush (NS) 0.9 % injection 3 mL (has no administration in time range)    ED Course  I have reviewed the triage vital signs and the nursing notes.  Pertinent labs & imaging results that were available during my care of the patient were reviewed by me and considered in my medical decision making (see chart for details).    MDM Rules/Calculators/A&P                      63yF with acute CVA. Onset of symptoms yesterday morning. Diplopia resolved but still some blurred vision and feels dizzy. Exam nonfocal but MRI showing acute infarct. Discussed with Neurology. [facility]   Final Clinical Impression(s) / ED Diagnoses Final diagnoses:  Acute cerebrovascular accident (CVA) [facility](HCC)    Rx / DC Orders ED Discharge Orders    None       Raeford RazorKohut, Yasha Tibbett, MD 10/27/19 [phone]

## 2019-10-26 NOTE — ED Notes (Signed)
Messaged pharmacy to verify meds. Unable to give meds at scheduled time.

## 2019-10-26 NOTE — Consult Note (Signed)
Neurology Consultation  Reason for Consult: syncope, stroke Referring Physician: Raeford Razor, MD, ED provider  CC: Syncope, stroke  History is obtained from: Patient, chart  HPI: Kendra Richardson is a 64 y.o. female systolic heart failure, CKD 3, hypertension, came in to [facility] long emergency room for evaluation of a syncopal episode. She says that she has been working with her cardiologist on adjusting her heart failure and blood pressure medications and was transitioned off of clonidine patch onto BiDil, which she took the first dose on Sunday night.  She woke up on Monday morning 10/25/2019, went to the bathroom and as she was looking outside the window, suddenly lost consciousness.  She does not remember how she fell to the floor or how long she was down for.  No seizure activity.  She continued to feel lightheaded and off-balance.  She said when she came to, she was having a lot of blurred vision and double vision.  Also reported intermittent palpitations as she had reported to the admitting hospitalist. No tongue bite.  No bowel bladder incontinence. No new weakness tingling or numbness.  Reports a headache that is persistent for the past day since the fall on the front of her head.  Also reports mild left eye swelling after she fell. Denies any prior strokes. Denies any chest pain nausea vomiting.  Denies prior illness or sickness.  Denies fevers chills.  In the emergency room, CT head with no acute changes.  MRI brain with 2 small areas of acute infarction in the left frontal lobe along with moderate chronic microvascular ischemic change with scattered areas of chronic microhemorrhage in the brain compatible with chronic longstanding severe hypertension.  LKW: Monday morning 10:30 AM-10/25/2019. tpa given?: no, outside the window Premorbid modified Rankin scale (mRS): 0  ROS: Performed and negative except as noted in HPI.  Past Medical History:  Diagnosis Date  . Abnormal  radiographic examination   . CHF (congestive heart failure) (HCC)   . Chronic kidney disease, stage III (moderate)   . Chronic kidney disease, stage III (moderate)   . CN (constipation)   . Essential hypertension, malignant   . HTN (hypertension)   . LVH (left ventricular hypertrophy)   . Protein-calorie malnutrition (HCC)     Family History  Problem Relation Age of Onset  . Breast cancer Mother   . Breast cancer Maternal Aunt   . Stroke Father   . Kidney disease Father    Social History:   reports that she has quit smoking. She does not have any smokeless tobacco history on file. No history on file for alcohol and drug.  Medications  Current Facility-Administered Medications:  .   stroke: mapping our early stages of recovery book, , Does not apply, Once, Charlsie Quest, MD .  acetaminophen (TYLENOL) tablet 650 mg, 650 mg, Oral, Q4H PRN **OR** acetaminophen (TYLENOL) 160 MG/5ML solution 650 mg, 650 mg, Per Tube, Q4H PRN **OR** acetaminophen (TYLENOL) suppository 650 mg, 650 mg, Rectal, Q4H PRN, Allena Katz, Vishal R, MD .  aspirin suppository 300 mg, 300 mg, Rectal, Daily **OR** aspirin tablet 325 mg, 325 mg, Oral, Daily, Patel, Vishal R, MD .  atorvastatin (LIPITOR) tablet 40 mg, 40 mg, Oral, Daily, Patel, Vishal R, MD .  enoxaparin (LOVENOX) injection 40 mg, 40 mg, Subcutaneous, Q24H, Patel, Vishal R, MD .  labetalol (NORMODYNE) injection 5 mg, 5 mg, Intravenous, Q4H PRN, Allena Katz, Vishal R, MD .  senna-docusate (Senokot-S) tablet 1 tablet, 1 tablet, Oral, QHS PRN, Allena Katz,  Vishal R, MD .  sodium chloride flush (NS) 0.9 % injection 3 mL, 3 mL, Intravenous, Once, Raeford Razor, MD   Exam: Current vital signs: BP (!) 178/106 (BP Location: Left Arm)   Pulse 73   Temp 98.8 F (37.1 C) (Oral)   Resp 18   Ht 5' 4.5" (1.638 m)   Wt 53.5 kg   SpO2 99%   BMI 19.94 kg/m  Vital signs in last 24 hours: Temp:  [98.6 F (37 C)-98.8 F (37.1 C)] 98.8 F (37.1 C) (05/04 2241) Pulse Rate:   [63-78] 73 (05/04 2241) Resp:  [16-22] 18 (05/04 2241) BP: (163-189)/(94-117) 178/106 (05/04 2241) SpO2:  [96 %-100 %] 99 % (05/04 2241) Weight:  [53.5 kg] 53.5 kg (05/04 1940) GENERAL: Awake, alert in NAD HEENT: - Normocephalic and atraumatic for most part, mild left eyelid swelling almost mimicking left upper lid ptosis. LUNGS - Clear to auscultation bilaterally with no wheezes CV - S1S2 RRR, no m/r/g, equal pulses bilaterally. ABDOMEN - Soft, nontender, nondistended with normoactive BS Ext: warm, well perfused, intact peripheral pulses, no edema  NEURO:  Mental Status: AA&Ox3  Language: speech is clear without evidence of aphasia or dysarthria..  Naming, repetition, fluency, and comprehension intact. Cranial Nerves: PERRL EOMI, visual fields full, no facial asymmetry, facial sensation intact, hearing intact, tongue/uvula/soft palate midline, normal sternocleidomastoid and trapezius muscle strength. No evidence of tongue atrophy or fibrillations Motor: Antigravity 5/5 in all fours. Tone: is normal and bulk is normal Sensation- Intact to light touch bilaterally, no extinction Coordination: FTN intact bilaterally, no ataxia in BLE. Gait- deferred  NIHSS-0   Labs I have reviewed labs in epic and the results pertinent to this consultation are: CBC    Component Value Date/Time   WBC 6.3 10/26/2019 1548   RBC 4.73 10/26/2019 1548   HGB 13.6 10/26/2019 1548   HCT 41.7 10/26/2019 1548   PLT 194 10/26/2019 1548   MCV 88.2 10/26/2019 1548   MCH 28.8 10/26/2019 1548   MCHC 32.6 10/26/2019 1548   RDW 12.5 10/26/2019 1548   LYMPHSABS 1.1 07/22/2008 1620   MONOABS 0.2 07/22/2008 1620   EOSABS 0.1 07/22/2008 1620   BASOSABS 0.0 07/22/2008 1620    CMP     Component Value Date/Time   NA 143 10/26/2019 2025   NA 141 09/02/2019 0923   K 3.8 10/26/2019 2025   CL 107 10/26/2019 2025   CO2 25 10/26/2019 2025   GLUCOSE 99 10/26/2019 2025   BUN 23 10/26/2019 2025   BUN 35 (H)  09/02/2019 0923   CREATININE 1.14 (H) 10/26/2019 2025   CALCIUM 10.3 10/26/2019 2025   PROT 6.2 07/23/2008 0625   ALBUMIN 3.5 07/23/2008 0625   AST 26 07/23/2008 0625   ALT 27 07/23/2008 0625   ALKPHOS 44 07/23/2008 0625   BILITOT 1.4 (H) 07/23/2008 0625   GFRNONAA 50 (L) 10/26/2019 2025   GFRAA 58 (L) 10/26/2019 2025   Imaging I have reviewed the images obtained:  CT-scan of the brain-no acute changes  MRI examination of the brain-2 small areas of acute ischemic stroke in the left frontal area.  Chronic microvascular changes.  Chronic microhemorrhages on the SWI images.  Assessment: 64 year old with severe hypertension, systolic heart failure, presented to the emergency room after what appears to be a syncopal episode.  Brain imaging revealed 2 areas of restricted diffusion in the left frontal lobe, which are punctate. Given her extensive cardiac history, likely cardioembolic source of the strokes. Also, syncopal episode might  be related to either her heart failure or could be attributable to an arrhythmia.  Other etiology could also include hypoperfusion in the setting of significant cervical or intracranial vascular stenosis-needs further work-up.  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 until cleared by bedside swallow evaluation Aspirin 325 for now. High intensity statin Allow for permissive hypertension-treat only if systolic blood pressures are greater than 220 on a as needed basis.  Stroke neurology will follow.    -- Amie Portland, MD [facility] Pager: [phone] If 7pm to 7am, please call on call as listed on AMION.

## 2019-10-26 NOTE — ED Notes (Addendum)
Called to give report receiving nurse not available at this time. Will call back in 10 minutes

## 2019-10-26 NOTE — ED Triage Notes (Signed)
Pt reports that she had issues with her BP. Reports had a change in her HTN medications. reports yesterday past out and hit her head. Pt c/o being dizzy/lightheaded. reports went to PCP today and was sent to ED for further evaluation. hasnt had her HTN medications today.

## 2019-10-27 ENCOUNTER — Inpatient Hospital Stay (HOSPITAL_COMMUNITY): Payer: Medicare Other

## 2019-10-27 DIAGNOSIS — I679 Cerebrovascular disease, unspecified: Secondary | ICD-10-CM

## 2019-10-27 DIAGNOSIS — R55 Syncope and collapse: Secondary | ICD-10-CM

## 2019-10-27 DIAGNOSIS — I671 Cerebral aneurysm, nonruptured: Secondary | ICD-10-CM | POA: Diagnosis not present

## 2019-10-27 DIAGNOSIS — I639 Cerebral infarction, unspecified: Secondary | ICD-10-CM

## 2019-10-27 DIAGNOSIS — I361 Nonrheumatic tricuspid (valve) insufficiency: Secondary | ICD-10-CM

## 2019-10-27 LAB — LIPID PANEL
Cholesterol: 207 mg/dL — ABNORMAL HIGH (ref 0–200)
HDL: 76 mg/dL (ref >40)
LDL Cholesterol: 101 mg/dL — ABNORMAL HIGH (ref 0–99)
Total CHOL/HDL Ratio: 2.7 RATIO
Triglycerides: 148 mg/dL (ref <150)
VLDL: 30 mg/dL (ref 0–40)

## 2019-10-27 LAB — BASIC METABOLIC PANEL
Anion gap: 12 (ref 5–15)
BUN: 21 mg/dL (ref 8–23)
CO2: 24 mmol/L (ref 22–32)
Calcium: 10 mg/dL (ref 8.9–10.3)
Chloride: 101 mmol/L (ref 98–111)
Creatinine, Ser: 1.25 mg/dL — ABNORMAL HIGH (ref 0.44–1.00)
GFR calc Af Amer: 52 mL/min — ABNORMAL LOW (ref >60)
GFR calc non Af Amer: 45 mL/min — ABNORMAL LOW (ref >60)
Glucose, Bld: 104 mg/dL — ABNORMAL HIGH (ref 70–99)
Potassium: 3.8 mmol/L (ref 3.5–5.1)
Sodium: 137 mmol/L (ref 135–145)

## 2019-10-27 LAB — CBC
HCT: 41.3 % (ref 36.0–46.0)
Hemoglobin: 13.6 g/dL (ref 12.0–15.0)
MCH: 28.5 pg (ref 26.0–34.0)
MCHC: 32.9 g/dL (ref 30.0–36.0)
MCV: 86.6 fL (ref 80.0–100.0)
Platelets: 213 10*3/uL (ref 150–400)
RBC: 4.77 MIL/uL (ref 3.87–5.11)
RDW: 12.3 % (ref 11.5–15.5)
WBC: 6.6 10*3/uL (ref 4.0–10.5)
nRBC: 0 % (ref 0.0–0.2)

## 2019-10-27 LAB — HIV ANTIBODY (ROUTINE TESTING W REFLEX): HIV Screen 4th Generation wRfx: NONREACTIVE

## 2019-10-27 LAB — ECHOCARDIOGRAM COMPLETE
Height: 64.5 in
Weight: 1915.36 oz

## 2019-10-27 LAB — HEMOGLOBIN A1C
Hgb A1c MFr Bld: 5.6 % (ref 4.8–5.6)
Mean Plasma Glucose: 114.02 mg/dL

## 2019-10-27 MED ORDER — ASPIRIN EC 325 MG PO TBEC
325.0000 mg | DELAYED_RELEASE_TABLET | Freq: Every day | ORAL | Status: DC
  Administered 2019-10-27 – 2019-10-28 (×2): 325 mg via ORAL
  Filled 2019-10-27 (×2): qty 1

## 2019-10-27 MED ORDER — CLOPIDOGREL BISULFATE 75 MG PO TABS
75.0000 mg | ORAL_TABLET | Freq: Every day | ORAL | Status: DC
  Administered 2019-10-27 – 2019-10-28 (×2): 75 mg via ORAL
  Filled 2019-10-27 (×2): qty 1

## 2019-10-27 NOTE — Evaluation (Signed)
Physical Therapy Evaluation Patient Details Name: Kendra Richardson MRN: [id number] DOB: 08/17/1955 Today's Date: 10/27/2019   History of Present Illness  Kendra Richardson is a 64 y.o. female systolic heart failure, CKD 3, hypertension, came in to [facility] long emergency room for evaluation of a syncopal episode. Pt fell and lost consciousness, sustained L eye contusion. 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 PMH: HTN CKD Stage III, CHF.    Clinical Impression  Pt admitted with above. Pt presenting with report of double vision when looking down and light headedness. Pt all with 2 episodes of loss of balance while ambulating requiring minA to prevent fall. Pt aware that her balance is off and is agreeable to stay with her sister (sister said she can stay with her because she works remotely). Pt to benefit from outpt PT to address balance impairment and to progress to indep function for safe transition home alone.    Follow Up Recommendations Outpatient PT;Supervision/Assistance - 24 hour(pt going to stay with sister)    Equipment Recommendations  None recommended by PT    Recommendations for Other Services       Precautions / Restrictions Precautions Precautions: Fall Restrictions Weight Bearing Restrictions: No      Mobility  Bed Mobility Overal bed mobility: Needs Assistance Bed Mobility: Supine to Sit     Supine to sit: Supervision     General bed mobility comments: HOB elevated, increased time, no physical assist needed  Transfers Overall transfer level: Needs assistance Equipment used: None Transfers: Sit to/from Stand Sit to Stand: Min guard         General transfer comment: min guard for safety due to report of dizziness/light headedness  Ambulation/Gait Ambulation/Gait assistance: Min guard Gait Distance (Feet): 300 Feet Assistive device: None Gait Pattern/deviations: Step-through pattern;Staggering left Gait velocity: slow Gait  velocity interpretation: 1.31 - 2.62 ft/sec, indicative of limited community ambulator General Gait Details: pt ambulated with steady, fluid gait pattern but did have 2 episodes of vearing/stumbing to the L requiring min A to maintain balance. Pt reports "sometimes I get double vision and it makes me loose my balance"  Stairs Stairs: Yes Stairs assistance: Min guard Stair Management: One rail Left;Alternating pattern Number of Stairs: 12 General stair comments: no episode of LOB, reciprocal and fluid however does report when she looks down the stair looks double  Wheelchair Mobility    Modified Rankin (Stroke Patients Only) Modified Rankin (Stroke Patients Only) Pre-Morbid Rankin Score: No symptoms Modified Rankin: Slight disability     Balance Overall balance assessment: Needs assistance Sitting-balance support: Feet supported;No upper extremity supported Sitting balance-Leahy Scale: Good     Standing balance support: No upper extremity supported Standing balance-Leahy Scale: Fair Standing balance comment: pt able to stand and complete pericare s/p tolieting and wash hands at sink without LOB.                  Standardized Balance Assessment Standardized Balance Assessment : Dynamic Gait Index   Dynamic Gait Index Level Surface: Mild Impairment Change in Gait Speed: Mild Impairment Gait with Horizontal Head Turns: Mild Impairment Gait with Vertical Head Turns: Mild Impairment Gait and Pivot Turn: Mild Impairment Step Over Obstacle: Mild Impairment Step Around Obstacles: Mild Impairment Steps: Mild Impairment Total Score: 16       Pertinent Vitals/Pain Pain Assessment: No/denies pain    Home Living Family/patient expects to be discharged to:: Private residence Living Arrangements: Alone Available Help at Discharge: Family;Available 24 hours/day(sister available) Type  of Home: House Home Access: Stairs to enter Entrance Stairs-Rails: None Entrance  Stairs-Number of Steps: 3 Home Layout: Two level;Bed/bath upstairs;1/2 bath on main level Home Equipment: Shower seat      Prior Function Level of Independence: Independent         Comments: retired from the [employer]     Hand Dominance   Dominant Hand: Right    Extremity/Trunk Assessment   Upper Extremity Assessment Upper Extremity Assessment: LUE deficits/detail LUE Deficits / Details: residual weakness from previous shoulder injury LUE Sensation: (mild numbness from previous injury)    Lower Extremity Assessment Lower Extremity Assessment: Overall WFL for tasks assessed    Cervical / Trunk Assessment Cervical / Trunk Assessment: Normal  Communication   Communication: No difficulties  Cognition Arousal/Alertness: Awake/alert Behavior During Therapy: WFL for tasks assessed/performed Overall Cognitive Status: Within Functional Limits for tasks assessed                                        General Comments General comments (skin integrity, edema, etc.): pt assited to the bathroom and washed hands at sink with supervision. BP 153/105    Exercises     Assessment/Plan    PT Assessment Patient needs continued PT services  PT Problem List Decreased activity tolerance;Decreased balance;Decreased mobility;Decreased coordination;Decreased range of motion;Decreased strength       PT Treatment Interventions DME instruction;Gait training;Stair training;Functional mobility training;Therapeutic activities;Therapeutic exercise;Balance training;Neuromuscular re-education    PT Goals (Current goals can be found in the Care Plan section)  Acute Rehab PT Goals Patient Stated Goal: home today PT Goal Formulation: With patient Time For Goal Achievement: 11/10/19 Potential to Achieve Goals: Good Additional Goals Additional Goal #1: Pt to score >19 on DGI to indicate minimal falls risk.    Frequency Min 4X/week   Barriers to discharge         Co-evaluation               AM-PAC PT "6 Clicks" Mobility  Outcome Measure Help needed turning from your back to your side while in a flat bed without using bedrails?: None Help needed moving from lying on your back to sitting on the side of a flat bed without using bedrails?: None Help needed moving to and from a bed to a chair (including a wheelchair)?: None Help needed standing up from a chair using your arms (e.g., wheelchair or bedside chair)?: None Help needed to walk in hospital room?: A Little Help needed climbing 3-5 steps with a railing? : A Little 6 Click Score: 22    End of Session Equipment Utilized During Treatment: Gait belt Activity Tolerance: Patient tolerated treatment well Patient left: in chair;with call bell/phone within reach;with family/visitor present Nurse Communication: Mobility status PT Visit Diagnosis: Unsteadiness on feet (R26.81);Difficulty in walking, not elsewhere classified (R26.2)    Time: 0102-7253 PT Time Calculation (min) (ACUTE ONLY): 29 min   Charges:   PT Evaluation $PT Eval Moderate Complexity: 1 Mod PT Treatments $Gait Training: 8-22 mins        Kittie Plater, PT, DPT Acute Rehabilitation Services Pager #: [phone] Office #: [phone]   Berline Lopes 10/27/2019, 11:43 AM

## 2019-10-27 NOTE — Progress Notes (Signed)
  Echocardiogram 2D Echocardiogram has been performed.  Kendra Richardson 10/27/2019, 2:36 PM

## 2019-10-27 NOTE — Evaluation (Signed)
Speech Language Pathology Evaluation Patient Details Name: Kendra Richardson MRN: [id number] DOB: [birth year] Today's Date: 10/27/2019 Time: 5364-6803 SLP Time Calculation (min) (ACUTE ONLY): 30 min  Problem List:  Patient Active Problem List   Diagnosis Date Noted  . Acute cerebrovascular accident (CVA) (HCC) 10/26/2019  . Chronic kidney disease, stage III (moderate)   . HFrEF (heart failure with reduced ejection fraction) (HCC) 08/05/2019  . Essential hypertension 07/07/2019  . Nonspecific abnormal electrocardiogram (ECG) (EKG) 07/07/2019   Past Medical History:  Past Medical History:  Diagnosis Date  . Abnormal radiographic examination   . CHF (congestive heart failure) (HCC)   . Chronic kidney disease, stage III (moderate)   . Chronic kidney disease, stage III (moderate)   . CN (constipation)   . Essential hypertension, malignant   . HTN (hypertension)   . LVH (left ventricular hypertrophy)   . Protein-calorie malnutrition (HCC)    Past Surgical History:  Past Surgical History:  Procedure Laterality Date  . BREAST EXCISIONAL BIOPSY Left    HPI:  pt is a 64 yo adm to [facility] with syncope, found to have small left frontal strokes.  PMH + for HTN and pt reports recent BP medication change.  Speech eval ordered.  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  She has a Naval [employer].   Assessment / Plan / Recommendation Clinical Impression  Cognistat testing completed with pt scoring average range.  She was able to follow multiple step directions with multiple processes.  No focal CN deficits and no dysarthria/aphasia.  NO SLP follow up indicated as pt at baseline status - pt agreeable to plan.    SLP Assessment  SLP Recommendation/Assessment: Patient does not need any further Speech Lanaguage Pathology Services    Follow Up Recommendations  None    Frequency and Duration   n/a        SLP Evaluation Cognition  Overall Cognitive Status: Within  Functional Limits for tasks assessed Arousal/Alertness: Awake/alert Orientation Level: Oriented X4 Attention: Focused;Sustained Focused Attention: Appears intact Sustained Attention: Appears intact Memory: Impaired Memory Impairment: Retrieval deficit(recalled 3/4 independently) Safety/Judgment: Appears intact       Comprehension  Auditory Comprehension Overall Auditory Comprehension: Appears within functional limits for tasks assessed Yes/No Questions: Not tested Commands: Within Functional Limits Conversation: Complex Other Conversation Comments: pt able to follow complext directions = multiple steps x4 Visual Recognition/Discrimination Discrimination: Within Function Limits Reading Comprehension Reading Status: Not tested    Expression Expression Primary Mode of Expression: Verbal Verbal Expression Overall Verbal Expression: Appears within functional limits for tasks assessed Initiation: No impairment Level of Generative/Spontaneous Verbalization: Conversation Repetition: No impairment Naming: No impairment(mixxed xylophone but named 7 correctly) Pragmatics: No impairment Non-Verbal Means of Communication: Not applicable Written Expression Dominant Hand: Right Written Expression: Not tested   Oral / Motor  Oral Motor/Sensory Function Overall Oral Motor/Sensory Function: Within functional limits Motor Speech Overall Motor Speech: Appears within functional limits for tasks assessed Respiration: Within functional limits Resonance: Within functional limits Articulation: Within functional limitis Intelligibility: Intelligible Motor Planning: Witnin functional limits   GO                    Kendra Richardson 10/27/2019, 9:40 AM. Kendra Infante, MS [facility] SLP Acute Rehab Services Office [phone]

## 2019-10-27 NOTE — Progress Notes (Signed)
STROKE TEAM PROGRESS NOTE   INTERVAL HISTORY No family at the bedside.  Patient lying in bed, recounted HPI with me.  Yesterday patient was standing in front door, had sudden onset episode of passing out.  She did not know how long she was unconscious.  She woke up by herself feeling lightheaded, blurry vision and heart palpitation.  She also found to have left periorbital swelling due to fall hitting left frontal area.  Patient currently feeling well, no complaints except blurry vision which has been better than yesterday.  She stated that she has been doing BP medication transitions, and she supposed not take new medications for 2 days but she did by herself.  She thought that maybe making her BP low but she did not check blood pressure at home yesterday.  Vitals:   10/27/19 0355 10/27/19 0500 10/27/19 0800 10/27/19 1146  BP: 129/80  (!) 169/107 (!) 152/98  Pulse: 66  68 66  Resp: 18  16 16   Temp: 98.7 F (37.1 C)  98.8 F (37.1 C) 98.5 F (36.9 C)  TempSrc: Oral  Oral Oral  SpO2: 99%  100% 99%  Weight:  54.3 kg    Height:        CBC:  Recent Labs  Lab 10/26/19 1548 10/27/19 0521  WBC 6.3 6.6  HGB 13.6 13.6  HCT 41.7 41.3  MCV 88.2 86.6  PLT 194 213    Basic Metabolic Panel:  Recent Labs  Lab 10/26/19 2025 10/27/19 0521  NA 143 137  K 3.8 3.8  CL 107 101  CO2 25 24  GLUCOSE 99 104*  BUN 23 21  CREATININE 1.14* 1.25*  CALCIUM 10.3 10.0   Lipid Panel: No results found for: CHOL, TRIG, HDL, CHOLHDL, VLDL, LDLCALC HgbA1c: No results found for: HGBA1C Urine Drug Screen: No results found for: LABOPIA, COCAINSCRNUR, LABBENZ, AMPHETMU, THCU, LABBARB  Alcohol Level No results found for: ETH  IMAGING past 24 hours CT ANGIO HEAD W OR WO CONTRAST  Result Date: 10/27/2019 CLINICAL DATA:  Follow-up examination for acute stroke. EXAM: CT ANGIOGRAPHY HEAD AND NECK TECHNIQUE: Multidetector CT imaging of the head and neck was performed using the standard protocol during bolus  administration of intravenous contrast. Multiplanar CT image reconstructions and MIPs were obtained to evaluate the vascular anatomy. Carotid stenosis measurements (when applicable) are obtained utilizing NASCET criteria, using the distal internal carotid diameter as the denominator. CONTRAST:  67mL OMNIPAQUE IOHEXOL 350 MG/ML SOLN COMPARISON:  Prior MRI from 10/26/2019. FINDINGS: CTA NECK FINDINGS Aortic arch: Visualized aortic arch of normal caliber. Origin of the great vessels incompletely visualized on this exam. Visualized subclavian arteries widely patent. Right carotid system: Right common carotid artery patent from its origin to the bifurcation without stenosis. No significant atheromatous change about the right bifurcation. Right ICA mildly tortuous but patent to the skull base without stenosis, dissection or occlusion. Left carotid system: Visualized left CCA patent to the bifurcation without stenosis. Minor atheromatous change about the left bifurcation without significant stenosis. Left ICA mildly tortuous but widely patent distally to the skull base without stenosis, dissection or occlusion. Vertebral arteries: Both vertebral arteries arise from the subclavian arteries. Left vertebral artery dominant. Small focal calcified plaque noted at the origin of the right vertebral artery without significant stenosis. Vertebral arteries otherwise mildly tortuous but widely patent to the skull base without stenosis, dissection or occlusion. Skeleton: No acute osseous abnormality. No discrete lytic or blastic osseous lesions. Moderate cervical spondylosis noted at C5-6. Other neck:  No other acute soft tissue abnormality within the neck. Subtle heterogeneous left thyroid nodule measures up to approximately 15 mm (series 5, image 23). Few scattered subcentimeter nodes measuring up to 7 mm noted within the left supraclavicular region. No pathologically enlarged lymph nodes. Left parotid gland appears to be absent.  Upper chest: Visualized upper chest demonstrates no acute finding. Centrilobular emphysema. Review of the MIP images confirms the above findings CTA HEAD FINDINGS Anterior circulation: Petrous segments patent bilaterally. Calcified atheromatous plaque throughout the carotid siphons with associated moderate multifocal narrowing (up to approximately 50% bilaterally). 5 mm saccular aneurysm at the level of the left posterior communicating artery is seen (series 9, image 90). This is directed posteriorly and slightly inferiorly. ICA termini well perfused. A1 segments patent bilaterally. Normal anterior communicating artery complex. Anterior cerebral arteries patent to their distal aspects without high-grade stenosis. Mild irregularity within the M1 segments bilaterally without high-grade stenosis. Normal MCA bifurcations. Extensive small vessel atheromatous irregularity throughout the MCA branches bilaterally with associated moderate to severe multifocal stenoses, slightly worse on the left. No large vessel occlusion within the anterior circulation. Posterior circulation: Calcified plaque involving the proximal V4 segments bilaterally with associated mild short-segment stenoses. Right PICA patent. Left PICA not definitely seen. Short-segment mild stenosis noted at the proximal basilar artery. Basilar otherwise widely patent to its distal aspect. Superior cerebral arteries patent bilaterally. Both PCAs primarily supplied via the basilar. Short-segment severe near occlusive left P1 stenosis (series 8, image 20). Additional multifocal severe left P2 stenoses seen distally. Moderate to severe segmental stenoses seen within the right P2 segment as well. PCAs remain patent to their distal aspects. Venous sinuses: Grossly patent allowing for timing the contrast bolus. Anatomic variants: None significant. Review of the MIP images confirms the above findings IMPRESSION: 1. Negative CTA for large vessel occlusion. 2. Moderate to  advanced intracranial atherosclerotic disease as above. Notable findings include severe left P1 and bilateral P2 stenoses, with moderate stenoses about the carotid siphons bilaterally. Extensive small vessel atheromatous irregularity, most pronounced about the left MCA and bilateral PCA distributions. 3. 5 mm left PCOM aneurysm. 4.  Emphysema (ICD10-J43.9). 5. Approximate 15 mm left thyroid nodule. Further assessment with dedicated thyroid ultrasound recommended for further evaluation. (Ref: J Am Coll Radiol. 2015 Feb;12(2): 143-50). Electronically Signed   By: Jeannine Boga M.D.   On: 10/27/2019 03:17   CT ANGIO NECK W OR WO CONTRAST  Result Date: 10/27/2019 CLINICAL DATA:  Follow-up examination for acute stroke. EXAM: CT ANGIOGRAPHY HEAD AND NECK TECHNIQUE: Multidetector CT imaging of the head and neck was performed using the standard protocol during bolus administration of intravenous contrast. Multiplanar CT image reconstructions and MIPs were obtained to evaluate the vascular anatomy. Carotid stenosis measurements (when applicable) are obtained utilizing NASCET criteria, using the distal internal carotid diameter as the denominator. CONTRAST:  31mL OMNIPAQUE IOHEXOL 350 MG/ML SOLN COMPARISON:  Prior MRI from 10/26/2019. FINDINGS: CTA NECK FINDINGS Aortic arch: Visualized aortic arch of normal caliber. Origin of the great vessels incompletely visualized on this exam. Visualized subclavian arteries widely patent. Right carotid system: Right common carotid artery patent from its origin to the bifurcation without stenosis. No significant atheromatous change about the right bifurcation. Right ICA mildly tortuous but patent to the skull base without stenosis, dissection or occlusion. Left carotid system: Visualized left CCA patent to the bifurcation without stenosis. Minor atheromatous change about the left bifurcation without significant stenosis. Left ICA mildly tortuous but widely patent distally to the  skull base  without stenosis, dissection or occlusion. Vertebral arteries: Both vertebral arteries arise from the subclavian arteries. Left vertebral artery dominant. Small focal calcified plaque noted at the origin of the right vertebral artery without significant stenosis. Vertebral arteries otherwise mildly tortuous but widely patent to the skull base without stenosis, dissection or occlusion. Skeleton: No acute osseous abnormality. No discrete lytic or blastic osseous lesions. Moderate cervical spondylosis noted at C5-6. Other neck: No other acute soft tissue abnormality within the neck. Subtle heterogeneous left thyroid nodule measures up to approximately 15 mm (series 5, image 23). Few scattered subcentimeter nodes measuring up to 7 mm noted within the left supraclavicular region. No pathologically enlarged lymph nodes. Left parotid gland appears to be absent. Upper chest: Visualized upper chest demonstrates no acute finding. Centrilobular emphysema. Review of the MIP images confirms the above findings CTA HEAD FINDINGS Anterior circulation: Petrous segments patent bilaterally. Calcified atheromatous plaque throughout the carotid siphons with associated moderate multifocal narrowing (up to approximately 50% bilaterally). 5 mm saccular aneurysm at the level of the left posterior communicating artery is seen (series 9, image 90). This is directed posteriorly and slightly inferiorly. ICA termini well perfused. A1 segments patent bilaterally. Normal anterior communicating artery complex. Anterior cerebral arteries patent to their distal aspects without high-grade stenosis. Mild irregularity within the M1 segments bilaterally without high-grade stenosis. Normal MCA bifurcations. Extensive small vessel atheromatous irregularity throughout the MCA branches bilaterally with associated moderate to severe multifocal stenoses, slightly worse on the left. No large vessel occlusion within the anterior circulation. Posterior  circulation: Calcified plaque involving the proximal V4 segments bilaterally with associated mild short-segment stenoses. Right PICA patent. Left PICA not definitely seen. Short-segment mild stenosis noted at the proximal basilar artery. Basilar otherwise widely patent to its distal aspect. Superior cerebral arteries patent bilaterally. Both PCAs primarily supplied via the basilar. Short-segment severe near occlusive left P1 stenosis (series 8, image 20). Additional multifocal severe left P2 stenoses seen distally. Moderate to severe segmental stenoses seen within the right P2 segment as well. PCAs remain patent to their distal aspects. Venous sinuses: Grossly patent allowing for timing the contrast bolus. Anatomic variants: None significant. Review of the MIP images confirms the above findings IMPRESSION: 1. Negative CTA for large vessel occlusion. 2. Moderate to advanced intracranial atherosclerotic disease as above. Notable findings include severe left P1 and bilateral P2 stenoses, with moderate stenoses about the carotid siphons bilaterally. Extensive small vessel atheromatous irregularity, most pronounced about the left MCA and bilateral PCA distributions. 3. 5 mm left PCOM aneurysm. 4.  Emphysema (ICD10-J43.9). 5. Approximate 15 mm left thyroid nodule. Further assessment with dedicated thyroid ultrasound recommended for further evaluation. (Ref: J Am Coll Radiol. 2015 Feb;12(2): 143-50). Electronically Signed   By: Rise Mu M.D.   On: 10/27/2019 03:17   MR BRAIN WO CONTRAST  Result Date: 10/26/2019 CLINICAL DATA:  Diplopia.  Hypertension. EXAM: MRI HEAD WITHOUT CONTRAST TECHNIQUE: Multiplanar, multiecho pulse sequences of the brain and surrounding structures were obtained without intravenous contrast. COMPARISON:  None. FINDINGS: Brain: Small foci of acute infarct in the left anterior insular cortex and the left lateral frontal lobe involving the cortex. Both of these infarcts are approximately  3 mm in size. Moderate chronic microvascular ischemic change in the white matter. Chronic ischemic change in the basal ganglia and pons. Small chronic infarct right cerebellum. Scattered areas of microhemorrhage including the left cerebellum, left thalamus, right occipital parietal lobe. Vascular: Normal arterial flow voids Skull and upper cervical spine: Negative Sinuses/Orbits: Negative Other:  None IMPRESSION: 1. 2 small areas of acute infarct in the left frontal lobe. 2. Moderate chronic microvascular ischemic change. Scattered areas of chronic microhemorrhage in the brain compatible with chronic hypertension. Electronically Signed   By: Marlan Palauharles  Clark M.D.   On: 10/26/2019 18:32    PHYSICAL EXAM  Temp:  [98.3 F (36.8 C)-99.1 F (37.3 C)] 98.8 F (37.1 C) (05/05 1603) Pulse Rate:  [63-78] 70 (05/05 1603) Resp:  [16-22] 16 (05/05 1603) BP: (129-189)/(80-117) 148/93 (05/05 1603) SpO2:  [96 %-100 %] 100 % (05/05 1603) Weight:  [53.5 kg-54.3 kg] 54.3 kg (05/05 0500)  General - Well nourished, well developed, in no apparent distress.  Ophthalmologic - fundi not visualized due to noncooperation.  Cardiovascular - Regular rhythm and rate.  Mental Status -  Level of arousal and orientation to time, place, and person were intact. Language including expression, naming, repetition, comprehension was assessed and found intact. Fund of Knowledge was assessed and was intact.  Cranial Nerves II - XII - II - Visual field intact OU.  However, she endorses decreased visual acuity bilaterally, but better than yesterday. III, IV, VI - Extraocular movements intact, no disconjugate eyes. V - Facial sensation intact bilaterally. VII - Facial movement intact bilaterally. VIII - Hearing & vestibular intact bilaterally. X - Palate elevates symmetrically. XI - Chin turning & shoulder shrug intact bilaterally. XII - Tongue protrusion intact.  Motor Strength - The patient's strength was normal in all  extremities and pronator drift was absent.  Bulk was normal and fasciculations were absent.   Motor Tone - Muscle tone was assessed at the neck and appendages and was normal.  Reflexes - The patient's reflexes were symmetrical in all extremities and she had no pathological reflexes.  Sensory - Light touch, temperature/pinprick were assessed and were symmetrical.    Coordination - The patient had normal movements in the hands and feet with no ataxia or dysmetria.  Tremor was absent.  Gait and Station - deferred.   ASSESSMENT/PLAN Ms. Santina EvansCatherine McDougald [name] is a 64 y.o. female with history of systolic heart failure, CKD 3, hypertension [facility] / LOC with resultant blurred and double vision, HA.   Stroke: 2 punctate MCA cortical infarcts most likely secondary to large vessel disease source in the setting of syncopal episode.  Cardioembolic source cannot be can be ruled out.  MRI  2 punctate L frontal lobe infarcts. Moderate small vessel disease. Scattered chronic microhemorrhages.  CTA head & neck no LVO. Moderate to advanced intracranial atherosclerosis (L P1 severe, B P2 stenoses, B ICA siphon stenoses, L MCA and B PCA small vessel atherosclerosis). L PCOM aneurysm 5mm.  2D Echo EF 50 to 50%  LDL 101  HgbA1c 5.6  Lovenox 40 mg sq daily for VTE prophylaxis  aspirin 81 mg daily prior to admission, now on aspirin 325 mg daily and clopidogrel 75 mg daily. Continue DAPT x 3 months then plavix alone given intracranial stenosis  Consider 30-day cardiac event monitoring to rule out AF  Therapy recommendations:  OP PT, no SLP  Disposition:  pending   Likely syncopal episode  Could be related to BP medication transition  Current BP stable  Heart palpitation  Patient woke up from LOC complaining heart palpitation  Recommend 30-day cardiac event monitoring as outpatient to rule out A. fib or other arrhythmia that can explain  syncope  Hypertension  Undergoing medication transitions with cardiology RPH  Stable but elevated 160s/100s . Permissive hypertension (  OK if < 220/120) but gradually normalize in 2-3 days . 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 Corliss Skains  Other Stroke Risk Factors  Advanced age  Former Cigarette smoker  Family hx stroke (father)  Chronic Systolic Congestive heart failure. Known EF 35%  Other Active Problems  CKD stage IIIa  Hospital day # 1  Neurology will sign off. Please call with questions. Pt will follow up with stroke clinic NP at [facility] in about 4 weeks. Thanks for the consult.  Marvel Plan, MD PhD Stroke Neurology 10/27/2019 4:20 PM   To contact Stroke Continuity provider, please refer to [email or web address]. After hours, contact General Neurology

## 2019-10-27 NOTE — Social Work (Signed)
CSW met with pt at bedside. CSW introduced self and explained her role. CSW completed sbirt with pt.  Pt scored a 0 on the sbirt scale. Pt denied alcohol use. Pt denied substance use. Pt did not need resources at this time.  Emeterio Reeve, Latanya Presser, [place] Social Worker [phone]

## 2019-10-27 NOTE — Progress Notes (Signed)
Occupational Therapy Evaluation Patient Details Name: Kendra Richardson MRN: [id number] DOB: January 14, 1956 Today's Date: 10/27/2019    History of Present Illness Kendra Richardson is a 64 y.o. female systolic heart failure, CKD 3, hypertension, came in to [facility] room for evaluation of a syncopal episode. Pt fell and lost consciousness, sustained L eye contusion. 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 PMH: HTN CKD Stage III, CHF.   Clinical Impression   PTA, pt lived alone and was independent, including driving. Pt unsteady at times with mobility and able to complete basic ADL tasks with set up/S. Pt states she feels "lightheaded". Complains of blurred vision but after further discussion states she has cataracts and that she has blurry vision at baseline. At this time recommend follow up with outpt OT to facilitate return to independence with IADL tasks. Will follow acutely.     Follow Up Recommendations  Outpatient OT;Supervision - Intermittent(REfrain from driving at this time; follow up with her eye do)    Equipment Recommendations  None recommended by OT    Recommendations for Other Services       Precautions / Restrictions Precautions Precautions: Fall Restrictions Weight Bearing Restrictions: No      Mobility Bed Mobility Overal bed mobility: Needs Assistance Bed Mobility: Supine to Sit     Supine to sit: Supervision     General bed mobility comments: HOB elevated, increased time, no physical assist needed  Transfers Overall transfer level: Needs assistance Equipment used: None Transfers: Sit to/from Stand Sit to Stand: Min guard         General transfer comment: min guard for safety due to report of dizziness/light headedness    Balance Overall balance assessment: Needs assistance Sitting-balance support: Feet supported;No upper extremity supported Sitting balance-Leahy Scale: Good     Standing balance support: No  upper extremity supported Standing balance-Leahy Scale: Fair Standing balance comment: pt able to stand and complete pericare s/p tolieting and wash hands at sink without LOB.                            ADL either performed or assessed with clinical judgement   ADL Overall ADL's : Needs assistance/impaired                                     Functional mobility during ADLs: Min guard General ADL Comments: Pt overall set up/S for basic ADL tasks. Recommend Superivison with IADL tasks. Recommend pt use showerchair.     Vision Baseline Vision/History: Wears glasses Wears Glasses: At all times Patient Visual Report: Blurring of vision Vision Assessment?: Yes Eye Alignment: Within Functional Limits Ocular Range of Motion: Within Functional Limits Alignment/Gaze Preference: Within Defined Limits Tracking/Visual Pursuits: Able to track stimulus in all quads without difficulty Saccades: Within functional limits Convergence: Within functional limits Visual Fields: No apparent deficits Additional Comments: Pt reports increased blurry vision however after further questions, pt states she has cataracts and her vision was blurry at baseline.      Perception Perception Comments: appears [facility]   Praxis Praxis Praxis tested?: Within functional limits    Pertinent Vitals/Pain Pain Assessment: No/denies pain     Hand Dominance Right   Extremity/Trunk Assessment Upper Extremity Assessment Upper Extremity Assessment: LUE deficits/detail LUE Deficits / Details: residual weakness from previous shoulder injury LUE Sensation: (mild numbness from previous injury)  Lower Extremity Assessment Lower Extremity Assessment: Defer to PT evaluation   Cervical / Trunk Assessment Cervical / Trunk Assessment: Normal   Communication Communication Communication: No difficulties   Cognition Arousal/Alertness: Awake/alert Behavior During Therapy: WFL for tasks  assessed/performed Overall Cognitive Status: No family/caregiver present to determine baseline cognitive functioning                                 General Comments: Unable to remember 2/3 words after 2 min delay; moves quickly with decreased awareness at times of unsteadiness and need to be more cautious with movement - may be baseline as pt states "I move fast all of the time"   General Comments  Educated pt on strategies for low vision including changing font and bolding text on phone to increase readability    Exercises     Shoulder Instructions      Home Living Family/patient expects to be discharged to:: Private residence Living Arrangements: Alone Available Help at Discharge: Family;Available 24 hours/day(sister available) Type of Home: House Home Access: Stairs to enter [employer] of Steps: 3 Entrance Stairs-Rails: None Home Layout: Two level;Bed/bath upstairs;1/2 bath on main level Alternate Level Stairs-Number of Steps: flight Alternate Level Stairs-Rails: Left Bathroom Shower/Tub: Teacher, early years/pre: Standard Bathroom Accessibility: Yes How Accessible: Accessible via walker Home Equipment: Shower seat          Prior Functioning/Environment Level of Independence: Independent        Comments: retired from the [employer] office        OT Problem List: Impaired balance (sitting and/or standing);Decreased safety awareness;Decreased cognition;Decreased knowledge of use of DME or AE      OT Treatment/Interventions: Self-care/ADL training;DME and/or AE instruction;Therapeutic activities;Cognitive remediation/compensation;Patient/family education;Balance training    OT Goals(Current goals can be found in the care plan section) Acute Rehab OT Goals Patient Stated Goal: home today OT Goal Formulation: With patient Time For Goal Achievement: 11/10/19 Potential to Achieve Goals: Good  OT Frequency: Min 2X/week   Barriers to  D/C:            Co-evaluation              AM-PAC OT "6 Clicks" Daily Activity     Outcome Measure Help from another person eating meals?: None Help from another person taking care of personal grooming?: A Little Help from another person toileting, which includes using toliet, bedpan, or urinal?: A Little Help from another person bathing (including washing, rinsing, drying)?: A Little Help from another person to put on and taking off regular upper body clothing?: A Little Help from another person to put on and taking off regular lower body clothing?: A Little 6 Click Score: 19   End of Session Nurse Communication: Mobility status  Activity Tolerance: Patient tolerated treatment well Patient left: in bed;with call bell/phone within reach;with bed alarm set  OT Visit Diagnosis: Unsteadiness on feet (R26.81);Other symptoms and signs involving the nervous system (R29.898)                Time: 1455-1520 OT Time Calculation (min): 25 min Charges:  OT General Charges $OT Visit: 1 Visit OT Evaluation $OT Eval Moderate Complexity: 1 Mod OT Treatments $Self Care/Home Management : 8-22 mins  Maurie Boettcher, OT/L   Acute OT Clinical Specialist Acute Rehabilitation Services Pager [phone] Office [phone]   [facility] 10/27/2019, 4:55 PM

## 2019-10-27 NOTE — Progress Notes (Signed)
PROGRESS NOTE    Kendra Richardson  LPF:[id number] DOB: 1955/12/25 DOA: 10/26/2019 PCP: Daisy Floro, MD    Brief Narrative:  64 year old female with history of chronic systolic heart failure known ejection fraction 35%, hypertension, CKD stage IIIa, recently being adjusted on blood pressure medication by her cardiologist came to the emergency room for evaluation of dizziness.  She had recent changes in her blood pressure medications.  She was looking out of window of her home and found down. In the emergency room, blood pressure 179/109.  Otherwise hemodynamically stable.  No evidence of infection.  MRI brain showed 2 small areas of acute infarct on the left frontal lobe and chronic microvascular ischemic changes.  Admitted for acute a stroke work-up.   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 MRI of the brain, acute left frontal lobe infarctions, possibly small vessel disease. CTA of the head and neck, extensive atherosclerotic disease.  No LVO. 2D echocardiogram, pending.  Known chronic systolic heart failure. Antiplatelet therapy, patient on aspirin at home.  Started on aspirin and Plavix. LDL, pending.  Started on Lipitor.   Hemoglobin A1c, no diabetes. Therapy recommendations, pending. Also check orthostatic blood pressures. If any changes on her ejection fraction, will discuss with cardiology about ambulatory event monitoring. Followed by neurology, further recommendation , we will follow up.  Hypertension: Uncontrolled.  Recently titrating dose of antihypertensives.  She is on amlodipine, BiDil, Coreg, Entresto 97/103.  Currently allowing permissive hypertension.   Will gradually reintroduce her blood pressure medications.  Will check orthostatic.  Chronic kidney disease stage IIIa: Renal functions at about her baseline.    DVT prophylaxis: Lovenox subcu Code Status: Full code Family Communication: None Disposition Plan: Status is: Inpatient  Remains inpatient appropriate because:Inpatient level of care appropriate due to severity of illness   Dispo: The patient is from: Home              Anticipated d/c is to: Home              Anticipated d/c date is: 1 day              Patient currently is not medically stable to d/c.  Patient admitted with acute stroke.  She has multiple medical work-up pending and rehab work-up pending.    Consultants:   Neurology  Procedures:   None  Antimicrobials:   None   Subjective: Patient seen and examined.  No overnight events.  Denies any dizziness.  She did have some orthostatic drop of systolic blood pressure, however it was less than 30.  She was not symptomatic on going to bathroom.  Blood pressures remained stable without antihypertensives.  Objective: Vitals:   10/27/19 0223 10/27/19 0355 10/27/19 0500 10/27/19 0800  BP: (!) 133/91 129/80  (!) 169/107  Pulse: 70 66  68  Resp: (!) 22 18  16   Temp: 98.3 F (36.8 C) 98.7 F (37.1 C)  98.8 F (37.1 C)  TempSrc: Oral Oral  Oral  SpO2: 99% 99%  100%  Weight:   54.3 kg   Height:        Intake/Output Summary (Last 24 hours) at 10/27/2019 1144 Last data filed at 10/27/2019 0800 Gross per 24 hour  Intake 240 ml  Output --  Net 240 ml   Filed Weights   10/26/19 1940 10/27/19 0500  Weight: 53.5 kg 54.3 kg    Examination:  General exam: Appears calm and comfortable, chronically sick looking but not in any distress. Respiratory system: Clear to auscultation. Respiratory effort normal.  No added sounds. Cardiovascular system: S1 & S2 heard, RRR. No JVD, murmurs, rubs, gallops or clicks. No pedal edema. Gastrointestinal system: Abdomen is  nondistended, soft and nontender. No organomegaly or masses felt. Normal bowel sounds heard. Central nervous system: Alert and oriented. No focal neurological deficits. Gait and balance not examined.  No focal neurological deficits on sensory and motor examination of the extremities. Extremities: Symmetric 5 x 5 power. Skin: No rashes, lesions or ulcers Psychiatry: Judgement and insight appear normal. Mood & affect appropriate.     Data Reviewed: I have personally reviewed following labs and imaging studies  CBC: Recent Labs  Lab 10/26/19 1548 10/27/19 0521  WBC 6.3 6.6  HGB 13.6 13.6  HCT 41.7 41.3  MCV 88.2 86.6  PLT 194 213   Basic Metabolic Panel: Recent Labs  Lab 10/26/19 1548 10/26/19 2025 10/27/19 0521  NA 141 143 137  K 4.2 3.8 3.8  CL 106 107 101  CO2 25 25 24   GLUCOSE 111* 99 104*  BUN 24* 23 21  CREATININE 1.20* 1.14* 1.25*  CALCIUM 9.9 10.3 10.0   GFR: Estimated Creatinine Clearance: 38.5 mL/min (A) (by C-G formula based on SCr of 1.25 mg/dL (H)). Liver Function Tests: No results for input(s): AST, ALT, ALKPHOS, BILITOT, PROT, ALBUMIN in the last 168 hours. No results for input(s): LIPASE, AMYLASE in the last 168 hours. No results for input(s): AMMONIA in the last 168 hours. Coagulation Profile: No results for input(s): INR, PROTIME in the last 168 hours. Cardiac Enzymes: No results for input(s): CKTOTAL, CKMB, CKMBINDEX, TROPONINI in the last 168 hours. BNP (last 3 results) No results for input(s): PROBNP in the last 8760 hours. HbA1C: No results for input(s): HGBA1C in the last 72 hours. CBG: Recent Labs  Lab 10/26/19 1551  GLUCAP 98   Lipid Profile: No results for input(s): CHOL, HDL, LDLCALC, TRIG, CHOLHDL, LDLDIRECT in the last 72 hours. Thyroid Function Tests: No results for input(s): TSH, T4TOTAL, FREET4, T3FREE, THYROIDAB in the last 72 hours. Anemia Panel: No results for input(s): VITAMINB12, FOLATE, FERRITIN, TIBC, IRON, RETICCTPCT  in the last 72 hours. Sepsis Labs: No results for input(s): PROCALCITON, LATICACIDVEN in the last 168 hours.  Recent Results (from the past 240 hour(s))  Respiratory Panel by RT PCR (Flu A&B, Covid) - Nasopharyngeal Swab     Status: None   Collection Time: 10/26/19  8:30 PM   Specimen: Nasopharyngeal Swab  Result Value Ref Range Status   SARS Coronavirus 2 by RT PCR NEGATIVE NEGATIVE Final    Comment: (NOTE) SARS-CoV-2 target nucleic acids are NOT DETECTED. The SARS-CoV-2 RNA is generally detectable in upper respiratoy specimens during the acute phase of infection. The lowest concentration of SARS-CoV-2 viral copies this assay can detect is 131 copies/mL. A negative result does not preclude SARS-Cov-2 infection and should not be used as the sole basis for treatment or other patient management decisions. A negative result may occur with  improper specimen collection/handling, submission of specimen other than nasopharyngeal swab, presence of viral mutation(s) within the areas targeted by this assay, and inadequate number of viral copies (<131 copies/mL). A negative result must be combined with clinical observations, patient history, and epidemiological information. The expected  result is Negative. Fact Sheet for Patients:  https://www.moore.com/ Fact Sheet for Healthcare Providers:  https://www.young.biz/ This test is not yet ap proved or cleared by the [place] FDA and  has been authorized for detection and/or diagnosis of SARS-CoV-2 by FDA under an Emergency Use Authorization (EUA). This EUA will remain  in effect (meaning this test can be used) for the duration of the COVID-19 declaration under Section 564(b)(1) of the Act, 21 U.S.C. section 360bbb-3(b)(1), unless the authorization is terminated or revoked sooner.    Influenza A by PCR NEGATIVE NEGATIVE Final   Influenza B by PCR NEGATIVE NEGATIVE Final    Comment: (NOTE) The Xpert  Xpress SARS-CoV-2/FLU/RSV assay is intended as an aid in  the diagnosis of influenza from Nasopharyngeal swab specimens and  should not be used as a sole basis for treatment. Nasal washings and  aspirates are unacceptable for Xpert Xpress SARS-CoV-2/FLU/RSV  testing. Fact Sheet for Patients: https://www.moore.com/ Fact Sheet for Healthcare Providers: https://www.young.biz/ This test is not yet approved or cleared by the [place] FDA and  has been authorized for detection and/or diagnosis of SARS-CoV-2 by  FDA under an Emergency Use Authorization (EUA). This EUA will remain  in effect (meaning this test can be used) for the duration of the  Covid-19 declaration under Section 564(b)(1) of the Act, 21  U.S.C. section 360bbb-3(b)(1), unless the authorization is  terminated or revoked. Performed at [facility], [address], [place], [place] [address]          Radiology Studies: CT ANGIO HEAD W OR WO CONTRAST  Result Date: 10/27/2019 CLINICAL DATA:  Follow-up examination for acute stroke. EXAM: CT ANGIOGRAPHY HEAD AND NECK TECHNIQUE: Multidetector CT imaging of the head and neck was performed using the standard protocol during bolus administration of intravenous contrast. Multiplanar CT image reconstructions and MIPs were obtained to evaluate the vascular anatomy. Carotid stenosis measurements (when applicable) are obtained utilizing NASCET criteria, using the distal internal carotid diameter as the denominator. CONTRAST:  56mL OMNIPAQUE IOHEXOL 350 MG/ML SOLN COMPARISON:  Prior MRI from 10/26/2019. FINDINGS: CTA NECK FINDINGS Aortic arch: Visualized aortic arch of normal caliber. Origin of the great vessels incompletely visualized on this exam. Visualized subclavian arteries widely patent. Right carotid system: Right common carotid artery patent from its origin to the bifurcation without stenosis. No significant atheromatous  change about the right bifurcation. Right ICA mildly tortuous but patent to the skull base without stenosis, dissection or occlusion. Left carotid system: Visualized left CCA patent to the bifurcation without stenosis. Minor atheromatous change about the left bifurcation without significant stenosis. Left ICA mildly tortuous but widely patent distally to the skull base without stenosis, dissection or occlusion. Vertebral arteries: Both vertebral arteries arise from the subclavian arteries. Left vertebral artery dominant. Small focal calcified plaque noted at the origin of the right vertebral artery without significant stenosis. Vertebral arteries otherwise mildly tortuous but widely patent to the skull base without stenosis, dissection or occlusion. Skeleton: No acute osseous abnormality. No discrete lytic or blastic osseous lesions. Moderate cervical spondylosis noted at C5-6. Other neck: No other acute soft tissue abnormality within the neck. Subtle heterogeneous left thyroid nodule measures up to approximately 15 mm (series 5, image 23). Few scattered subcentimeter nodes measuring up to 7 mm noted within the left supraclavicular region. No pathologically enlarged lymph nodes. Left parotid gland appears to be absent. Upper chest: Visualized upper chest demonstrates no acute finding. Centrilobular emphysema. Review of the MIP images confirms the above  findings CTA HEAD FINDINGS Anterior circulation: Petrous segments patent bilaterally. Calcified atheromatous plaque throughout the carotid siphons with associated moderate multifocal narrowing (up to approximately 50% bilaterally). 5 mm saccular aneurysm at the level of the left posterior communicating artery is seen (series 9, image 90). This is directed posteriorly and slightly inferiorly. ICA termini well perfused. A1 segments patent bilaterally. Normal anterior communicating artery complex. Anterior cerebral arteries patent to their distal aspects without  high-grade stenosis. Mild irregularity within the M1 segments bilaterally without high-grade stenosis. Normal MCA bifurcations. Extensive small vessel atheromatous irregularity throughout the MCA branches bilaterally with associated moderate to severe multifocal stenoses, slightly worse on the left. No large vessel occlusion within the anterior circulation. Posterior circulation: Calcified plaque involving the proximal V4 segments bilaterally with associated mild short-segment stenoses. Right PICA patent. Left PICA not definitely seen. Short-segment mild stenosis noted at the proximal basilar artery. Basilar otherwise widely patent to its distal aspect. Superior cerebral arteries patent bilaterally. Both PCAs primarily supplied via the basilar. Short-segment severe near occlusive left P1 stenosis (series 8, image 20). Additional multifocal severe left P2 stenoses seen distally. Moderate to severe segmental stenoses seen within the right P2 segment as well. PCAs remain patent to their distal aspects. Venous sinuses: Grossly patent allowing for timing the contrast bolus. Anatomic variants: None significant. Review of the MIP images confirms the above findings IMPRESSION: 1. Negative CTA for large vessel occlusion. 2. Moderate to advanced intracranial atherosclerotic disease as above. Notable findings include severe left P1 and bilateral P2 stenoses, with moderate stenoses about the carotid siphons bilaterally. Extensive small vessel atheromatous irregularity, most pronounced about the left MCA and bilateral PCA distributions. 3. 5 mm left PCOM aneurysm. 4.  Emphysema (ICD10-J43.9). 5. Approximate 15 mm left thyroid nodule. Further assessment with dedicated thyroid ultrasound recommended for further evaluation. (Ref: J Am Coll Radiol. 2015 Feb;12(2): 143-50). Electronically Signed   By: Rise Mu M.D.   On: 10/27/2019 03:17   CT ANGIO NECK W OR WO CONTRAST  Result Date: 10/27/2019 CLINICAL DATA:   Follow-up examination for acute stroke. EXAM: CT ANGIOGRAPHY HEAD AND NECK TECHNIQUE: Multidetector CT imaging of the head and neck was performed using the standard protocol during bolus administration of intravenous contrast. Multiplanar CT image reconstructions and MIPs were obtained to evaluate the vascular anatomy. Carotid stenosis measurements (when applicable) are obtained utilizing NASCET criteria, using the distal internal carotid diameter as the denominator. CONTRAST:  52mL OMNIPAQUE IOHEXOL 350 MG/ML SOLN COMPARISON:  Prior MRI from 10/26/2019. FINDINGS: CTA NECK FINDINGS Aortic arch: Visualized aortic arch of normal caliber. Origin of the great vessels incompletely visualized on this exam. Visualized subclavian arteries widely patent. Right carotid system: Right common carotid artery patent from its origin to the bifurcation without stenosis. No significant atheromatous change about the right bifurcation. Right ICA mildly tortuous but patent to the skull base without stenosis, dissection or occlusion. Left carotid system: Visualized left CCA patent to the bifurcation without stenosis. Minor atheromatous change about the left bifurcation without significant stenosis. Left ICA mildly tortuous but widely patent distally to the skull base without stenosis, dissection or occlusion. Vertebral arteries: Both vertebral arteries arise from the subclavian arteries. Left vertebral artery dominant. Small focal calcified plaque noted at the origin of the right vertebral artery without significant stenosis. Vertebral arteries otherwise mildly tortuous but widely patent to the skull base without stenosis, dissection or occlusion. Skeleton: No acute osseous abnormality. No discrete lytic or blastic osseous lesions. Moderate cervical spondylosis noted at C5-6. Other  neck: No other acute soft tissue abnormality within the neck. Subtle heterogeneous left thyroid nodule measures up to approximately 15 mm (series 5, image  23). Few scattered subcentimeter nodes measuring up to 7 mm noted within the left supraclavicular region. No pathologically enlarged lymph nodes. Left parotid gland appears to be absent. Upper chest: Visualized upper chest demonstrates no acute finding. Centrilobular emphysema. Review of the MIP images confirms the above findings CTA HEAD FINDINGS Anterior circulation: Petrous segments patent bilaterally. Calcified atheromatous plaque throughout the carotid siphons with associated moderate multifocal narrowing (up to approximately 50% bilaterally). 5 mm saccular aneurysm at the level of the left posterior communicating artery is seen (series 9, image 90). This is directed posteriorly and slightly inferiorly. ICA termini well perfused. A1 segments patent bilaterally. Normal anterior communicating artery complex. Anterior cerebral arteries patent to their distal aspects without high-grade stenosis. Mild irregularity within the M1 segments bilaterally without high-grade stenosis. Normal MCA bifurcations. Extensive small vessel atheromatous irregularity throughout the MCA branches bilaterally with associated moderate to severe multifocal stenoses, slightly worse on the left. No large vessel occlusion within the anterior circulation. Posterior circulation: Calcified plaque involving the proximal V4 segments bilaterally with associated mild short-segment stenoses. Right PICA patent. Left PICA not definitely seen. Short-segment mild stenosis noted at the proximal basilar artery. Basilar otherwise widely patent to its distal aspect. Superior cerebral arteries patent bilaterally. Both PCAs primarily supplied via the basilar. Short-segment severe near occlusive left P1 stenosis (series 8, image 20). Additional multifocal severe left P2 stenoses seen distally. Moderate to severe segmental stenoses seen within the right P2 segment as well. PCAs remain patent to their distal aspects. Venous sinuses: Grossly patent allowing for  timing the contrast bolus. Anatomic variants: None significant. Review of the MIP images confirms the above findings IMPRESSION: 1. Negative CTA for large vessel occlusion. 2. Moderate to advanced intracranial atherosclerotic disease as above. Notable findings include severe left P1 and bilateral P2 stenoses, with moderate stenoses about the carotid siphons bilaterally. Extensive small vessel atheromatous irregularity, most pronounced about the left MCA and bilateral PCA distributions. 3. 5 mm left PCOM aneurysm. 4.  Emphysema (ICD10-J43.9). 5. Approximate 15 mm left thyroid nodule. Further assessment with dedicated thyroid ultrasound recommended for further evaluation. (Ref: J Am Coll Radiol. 2015 Feb;12(2): 143-50). Electronically Signed   By: Rise Mu M.D.   On: 10/27/2019 03:17   MR BRAIN WO CONTRAST  Result Date: 10/26/2019 CLINICAL DATA:  Diplopia.  Hypertension. EXAM: MRI HEAD WITHOUT CONTRAST TECHNIQUE: Multiplanar, multiecho pulse sequences of the brain and surrounding structures were obtained without intravenous contrast. COMPARISON:  None. FINDINGS: Brain: Small foci of acute infarct in the left anterior insular cortex and the left lateral frontal lobe involving the cortex. Both of these infarcts are approximately 3 mm in size. Moderate chronic microvascular ischemic change in the white matter. Chronic ischemic change in the basal ganglia and pons. Small chronic infarct right cerebellum. Scattered areas of microhemorrhage including the left cerebellum, left thalamus, right occipital parietal lobe. Vascular: Normal arterial flow voids Skull and upper cervical spine: Negative Sinuses/Orbits: Negative Other: None IMPRESSION: 1. 2 small areas of acute infarct in the left frontal lobe. 2. Moderate chronic microvascular ischemic change. Scattered areas of chronic microhemorrhage in the brain compatible with chronic hypertension. Electronically Signed   By: Marlan Palau M.D.   On: 10/26/2019  18:32        Scheduled Meds:  aspirin EC  325 mg Oral Daily   atorvastatin  40 mg Oral  Daily   clopidogrel  75 mg Oral Daily   enoxaparin (LOVENOX) injection  40 mg Subcutaneous Q24H   sodium chloride flush  3 mL Intravenous Once   Continuous Infusions:   LOS: 1 day    Time spent: 30 minutes    Barb Merino, MD [facility] Pager [phone]

## 2019-10-28 ENCOUNTER — Telehealth: Payer: Self-pay | Admitting: Radiology

## 2019-10-28 ENCOUNTER — Telehealth (HOSPITAL_COMMUNITY): Payer: Self-pay | Admitting: Radiology

## 2019-10-28 ENCOUNTER — Other Ambulatory Visit: Payer: Self-pay | Admitting: Medical

## 2019-10-28 ENCOUNTER — Other Ambulatory Visit (HOSPITAL_COMMUNITY): Payer: Self-pay | Admitting: Neurology

## 2019-10-28 DIAGNOSIS — I639 Cerebral infarction, unspecified: Secondary | ICD-10-CM

## 2019-10-28 DIAGNOSIS — I671 Cerebral aneurysm, nonruptured: Secondary | ICD-10-CM

## 2019-10-28 MED ORDER — ASPIRIN EC 81 MG PO TBEC: 81.0000 mg | DELAYED_RELEASE_TABLET | Freq: Every day | ORAL | 2 refills | Status: DC

## 2019-10-28 MED ORDER — ATORVASTATIN CALCIUM 40 MG PO TABS: 40.0000 mg | ORAL_TABLET | Freq: Every day | ORAL | 2 refills | Status: DC

## 2019-10-28 MED ORDER — CLOPIDOGREL BISULFATE 75 MG PO TABS: 75.0000 mg | ORAL_TABLET | Freq: Every day | ORAL | 2 refills | Status: DC

## 2019-10-28 NOTE — Plan of Care (Signed)
Adequate for discharge.

## 2019-10-28 NOTE — TOC Initial Note (Signed)
Transition of Care [facility]) - Initial/Assessment Note    Patient Details  Name: Kendra Richardson MRN: [id number] Date of Birth: December 01, 1955  Transition of [facility]) CM/SW Contact:    Kermit Balo, RN Phone Number: 10/28/2019, 8:16 AM  Clinical Narrative:                 Pt plans on staying with her sister after d/c from the hospital. Her sister can provide the needed supervision.  PT/OT recommending outpatient therapy. Pt asking to attend [facility]. Orders in Epic and information on the AVS.  Pt will have transportation to the appts.  She denies any issues with her home medications.  TOC following for further d/c needs.   Expected Discharge Plan: OP Rehab Barriers to Discharge: Continued Medical Work up   Patient Goals and CMS Choice     Choice offered to / list presented to : Patient  Expected Discharge Plan and Services Expected Discharge Plan: OP Rehab   Discharge Planning Services: CM Consult   Living arrangements for the past 2 months: Single Family Home                                      Prior Living Arrangements/Services Living arrangements for the past 2 months: Single Family Home Lives with:: Self Patient language and need for interpreter reviewed:: Yes Do you feel safe going back to the place where you live?: Yes      Need for Family Participation in Patient Care: Yes (Comment) Care giver support system in place?: Yes (comment)(sister)   Criminal Activity/Legal Involvement Pertinent to Current Situation/Hospitalization: No - Comment as needed  Activities of Daily Living Home Assistive Devices/Equipment: None ADL Screening (condition at time of admission) Patient's cognitive ability adequate to safely complete daily activities?: Yes Is the patient deaf or have difficulty hearing?: No Does the patient have difficulty seeing, even when wearing glasses/contacts?: No Does the patient have difficulty concentrating, remembering,  or making decisions?: No Patient able to express need for assistance with ADLs?: No Does the patient have difficulty dressing or bathing?: No Independently performs ADLs?: Yes (appropriate for developmental age) Does the patient have difficulty walking or climbing stairs?: No Weakness of Legs: None Weakness of Arms/Hands: None  Permission Sought/Granted                  Emotional Assessment Appearance:: Appears stated age Attitude/Demeanor/Rapport: Engaged Affect (typically observed): Accepting Orientation: : Oriented to Self, Oriented to Place, Oriented to  Time, Oriented to Situation   Psych Involvement: No (comment)  Admission diagnosis:  Acute cerebrovascular accident (CVA) [facility]) [I63.9] Patient Active Problem List   Diagnosis Date Noted  . Acute cerebrovascular accident (CVA) (HCC) 10/26/2019  . Chronic kidney disease, stage III (moderate)   . HFrEF (heart failure with reduced ejection fraction) (HCC) 08/05/2019  . Essential hypertension 07/07/2019  . Nonspecific abnormal electrocardiogram (ECG) (EKG) 07/07/2019   PCP:  Daisy Floro, MD Pharmacy:   [facility] [address] [place] - [address] [place] [address] Phone: [phone] Fax: [phone]     Social Determinants of Health (SDOH) Interventions    Readmission Risk Interventions No flowsheet data found.

## 2019-10-28 NOTE — Telephone Encounter (Signed)
Pt referred to Dr. Corliss Skains for brain aneurysm by Dr. Marvel Plan, MD. Jeanene Erb pt to schedule consult with Deveshwar. No answer, left VM JM

## 2019-10-28 NOTE — Progress Notes (Signed)
Discharge information given to patient. Patient waiting for sister to pick her up

## 2019-10-28 NOTE — Telephone Encounter (Signed)
Enrolled patient for a 30 day [employer] Event monitor to be mailed to patients home. Brief instructions were gone over with the patient and she knows to expect the monitor to arrive in 5-7 days 

## 2019-10-28 NOTE — Progress Notes (Signed)
Physical Therapy Treatment Patient Details Name: Kendra Richardson MRN: [id number] DOB: 10/25/1955 Today's Date: 10/28/2019    History of Present Illness Kendra Richardson Peron is a 64 y.o. female systolic heart failure, CKD 3, hypertension, came in to [facility] room for evaluation of a syncopal episode. Pt fell and lost consciousness, sustained L eye contusion. 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 PMH: HTN CKD Stage III, CHF.    PT Comments    Pt received in bed. Reports feeling lightheaded but agreeable to ambulation. She required supervision transfers and min guard assist ambulation 350' without AD. No LOB noted. Pt drifting R/L. Pt reports she typically walks very fast. Gait speed remains mildly decreased. Pt returned to bed at end of session. Pt plans to stay with her sister upon discharge.    Follow Up Recommendations  Outpatient PT;Supervision/Assistance - 24 hour     Equipment Recommendations  None recommended by PT    Recommendations for Other Services       Precautions / Restrictions Precautions Precautions: Fall    Mobility  Bed Mobility Overal bed mobility: Modified Independent                Transfers Overall transfer level: Needs assistance Equipment used: None Transfers: Sit to/from Stand Sit to Stand: Supervision         General transfer comment: supervision for safety, pt with c/o feeling lightheaded  Ambulation/Gait Ambulation/Gait assistance: Min guard Gait Distance (Feet): 350 Feet Assistive device: None Gait Pattern/deviations: Step-through pattern;Drifts right/left Gait velocity: mildly decreased Gait velocity interpretation: >2.62 ft/sec, indicative of community ambulatory General Gait Details: min guard for safety. No overt LOB. Drifting R/L.   Stairs             Wheelchair Mobility    Modified Rankin (Stroke Patients Only) Modified Rankin (Stroke Patients Only) Pre-Morbid Rankin  Score: No symptoms Modified Rankin: Slight disability     Balance Overall balance assessment: Needs assistance Sitting-balance support: Feet supported;No upper extremity supported Sitting balance-Leahy Scale: Good     Standing balance support: No upper extremity supported;During functional activity Standing balance-Leahy Scale: Good                              Cognition Arousal/Alertness: Awake/alert Behavior During Therapy: WFL for tasks assessed/performed Overall Cognitive Status: No family/caregiver present to determine baseline cognitive functioning                                 General Comments: A&O x 4. Appropriate. Decreased safety awareness.      Exercises      General Comments        Pertinent Vitals/Pain Pain Assessment: No/denies pain    Home Living                      Prior Function            PT Goals (current goals can now be found in the care plan section) Acute Rehab PT Goals Patient Stated Goal: home Progress towards PT goals: Progressing toward goals    Frequency    Min 4X/week      PT Plan Current plan remains appropriate    Co-evaluation              AM-PAC PT "6 Clicks" Mobility   Outcome Measure  Help needed turning from  your back to your side while in a flat bed without using bedrails?: None Help needed moving from lying on your back to sitting on the side of a flat bed without using bedrails?: None Help needed moving to and from a bed to a chair (including a wheelchair)?: None Help needed standing up from a chair using your arms (e.g., wheelchair or bedside chair)?: None Help needed to walk in hospital room?: A Little Help needed climbing 3-5 steps with a railing? : A Little 6 Click Score: 22    End of Session Equipment Utilized During Treatment: Gait belt Activity Tolerance: Patient tolerated treatment well Patient left: in bed;with call bell/phone within reach Nurse  Communication: Mobility status PT Visit Diagnosis: Unsteadiness on feet (R26.81);Difficulty in walking, not elsewhere classified (R26.2)     Time: 3846-6599 PT Time Calculation (min) (ACUTE ONLY): 9 min  Charges:  $Gait Training: 8-22 mins                     Aida Raider, PT  Office # [phone] Pager [phone]    Ilda Foil 10/28/2019, 9:57 AM

## 2019-10-28 NOTE — Discharge Summary (Signed)
Physician Discharge Summary  Kendra Richardson ZDG:[id number] DOB: 1955/09/16 DOA: 10/26/2019  PCP: Daisy Floro, MD  Admit date: 10/26/2019 Discharge date: 10/28/2019  Admitted From: Home Disposition: Home  Recommendations for Outpatient Follow-up:  1. Follow up with PCP in 1-2 weeks 2. Keep a logbook of blood pressure at home and bring it to your doctor's office visit.  Home Health: Outpatient PT OT Equipment/Devices: None  Discharge Condition: Stable CODE STATUS: Full code Diet recommendation: Low salt diet  Discharge summary: 64 year old female with history of chronic systolic heart failure known ejection fraction 35%, hypertension, CKD stage IIIa, recently being adjusted on blood pressure medication by her cardiologist came to the emergency room for evaluation of dizziness.  She had recent changes in her blood pressure medications.  She was looking out of window of her home and found down. In the emergency room, blood pressure 179/109.  Otherwise hemodynamically stable.  No evidence of infection.  MRI brain showed 2 small areas of acute infarct on the left frontal lobe and chronic microvascular ischemic changes.    She was admitted to hospital with acute ischemic stroke and work-up.  Acute thrombotic stroke, left ACA territory infarction: Clinical findings, presented with dizziness and presyncopal episode. CT head findings, no acute findings. MRI of the brain, acute left frontal lobe infarctions possibly small vessel disease. CTA of the head and neck, extensive atherosclerotic disease.  No large vessel occlusion. 2D echocardiogram, ejection fraction 55%.  Improved from previous 35%.  No source of emboli. Antiplatelet therapy, patient on aspirin at home.  Neurology recommended to continue aspirin and Plavix for 3 months and continue Plavix alone due to significant atherosclerotic disease intracranially. LDL 101.  Not on a statin at home.  Started on Lipitor 40 mg daily.   Therapy recommendations, outpatient PT OT. She will also need 30-day monitoring, cardiology to provide ambulatory monitoring before discharge.  Hypertension with chronic systolic heart failure: She is on heart failure regimen and followed by cardiology.  Patient is on beta-blockers and Entresto.  She recently had additional BiDil prescribed.  Symptoms started after taking BiDil. Her blood pressures has been fairly stable without antihypertensives in the hospital. Suggested to start taking amlodipine, Coreg and Entresto.  Suggested to hold off on BiDil at it may be causing orthostatic hypotension. She will monitor her blood pressures at home.  She will not be taking BiDil until outpatient follow-up. Her kidney functions are at about her baseline.  Patient is medically stable.  She is able to go home today.  She will be going to outpatient PT OT.  She is already established with PT OT and working on her left shoulder.   Discharge Diagnoses:  Principal Problem:   Acute cerebrovascular accident (CVA) (HCC) Active Problems:   Essential hypertension   HFrEF (heart failure with reduced ejection fraction) (HCC)   Chronic kidney disease, stage III (moderate)    Discharge Instructions  Discharge Instructions    Ambulatory referral to Neurology   Complete by: As directed    Follow up with stroke clinic NP (Jessica Vanschaick or Darrol Angel, if both not available, consider Manson Allan, or Ahern) at [facility] in about 4 weeks. Thanks.   Ambulatory referral to Occupational Therapy   Complete by: As directed    Ambulatory referral to Physical Therapy   Complete by: As directed    Call MD for:  difficulty breathing, headache or visual disturbances   Complete by: As directed    Call MD for:  persistant dizziness  or light-headedness   Complete by: As directed    Diet - low sodium heart healthy   Complete by: As directed    Discharge instructions   Complete by: As directed    Do not start new  blood pressure medicine BIDIL  until seen in follow up. Resume rest of the medications   Increase activity slowly   Complete by: As directed      Allergies as of 10/28/2019   No Known Allergies     Medication List    STOP taking these medications   BiDil 20-37.5 MG tablet Generic drug: isosorbide-hydrALAZINE     TAKE these medications   amLODipine 10 MG tablet Commonly known as: NORVASC Take 10 mg by mouth daily.   aspirin EC 81 MG tablet Take 1 tablet (81 mg total) by mouth daily.   atorvastatin 40 MG tablet Commonly known as: LIPITOR Take 1 tablet (40 mg total) by mouth daily. Start taking on: Oct 29, 2019   carvedilol 25 MG tablet Commonly known as: COREG Take 1 tablet (25 mg total) by mouth 2 (two) times daily.   clopidogrel 75 MG tablet Commonly known as: PLAVIX Take 1 tablet (75 mg total) by mouth daily. Start taking on: Oct 29, 2019   Entresto 97-103 MG Generic drug: sacubitril-valsartan Take 1 tablet by mouth 2 (two) times daily.   multivitamin tablet Take 1 tablet by mouth daily.      Follow-up Information    [facility] Follow [facility].   Specialty: Rehabilitation Why: The outpatient rehab will contact you for the first appointment Contact information: [address] [id number] mc [place] [address] [phone]       [facility]. Schedule an appointment as soon as possible for a visit in 4 week(s).   Specialty: Neurology Contact information: [address] [place] [address] [phone]       Julieanne Cotton, MD. Schedule an appointment as soon as possible for a visit in 4 week(s).   Specialties: Interventional Radiology, Radiology Contact information: [address] [place] [address] [phone]          No Known Allergies  Consultations:  Neurology   Procedures/Studies: CT ANGIO HEAD W OR WO CONTRAST  Result  Date: 10/27/2019 CLINICAL DATA:  Follow-up examination for acute stroke. EXAM: CT ANGIOGRAPHY HEAD AND NECK TECHNIQUE: Multidetector CT imaging of the head and neck was performed using the standard protocol during bolus administration of intravenous contrast. Multiplanar CT image reconstructions and MIPs were obtained to evaluate the vascular anatomy. Carotid stenosis measurements (when applicable) are obtained utilizing NASCET criteria, using the distal internal carotid diameter as the denominator. CONTRAST:  75mL OMNIPAQUE IOHEXOL 350 MG/ML SOLN COMPARISON:  Prior MRI from 10/26/2019. FINDINGS: CTA NECK FINDINGS Aortic arch: Visualized aortic arch of normal caliber. Origin of the great vessels incompletely visualized on this exam. Visualized subclavian arteries widely patent. Right carotid system: Right common carotid artery patent from its origin to the bifurcation without stenosis. No significant atheromatous change about the right bifurcation. Right ICA mildly tortuous but patent to the skull base without stenosis, dissection or occlusion. Left carotid system: Visualized left CCA patent to the bifurcation without stenosis. Minor atheromatous change about the left bifurcation without significant stenosis. Left ICA mildly tortuous but widely patent distally to the skull base without stenosis, dissection or occlusion. Vertebral arteries: Both vertebral arteries arise from the subclavian arteries. Left vertebral artery dominant. Small focal calcified plaque noted at the  origin of the right vertebral artery without significant stenosis. Vertebral arteries otherwise mildly tortuous but widely patent to the skull base without stenosis, dissection or occlusion. Skeleton: No acute osseous abnormality. No discrete lytic or blastic osseous lesions. Moderate cervical spondylosis noted at C5-6. Other neck: No other acute soft tissue abnormality within the neck. Subtle heterogeneous left thyroid nodule measures up to  approximately 15 mm (series 5, image 23). Few scattered subcentimeter nodes measuring up to 7 mm noted within the left supraclavicular region. No pathologically enlarged lymph nodes. Left parotid gland appears to be absent. Upper chest: Visualized upper chest demonstrates no acute finding. Centrilobular emphysema. Review of the MIP images confirms the above findings CTA HEAD FINDINGS Anterior circulation: Petrous segments patent bilaterally. Calcified atheromatous plaque throughout the carotid siphons with associated moderate multifocal narrowing (up to approximately 50% bilaterally). 5 mm saccular aneurysm at the level of the left posterior communicating artery is seen (series 9, image 90). This is directed posteriorly and slightly inferiorly. ICA termini well perfused. A1 segments patent bilaterally. Normal anterior communicating artery complex. Anterior cerebral arteries patent to their distal aspects without high-grade stenosis. Mild irregularity within the M1 segments bilaterally without high-grade stenosis. Normal MCA bifurcations. Extensive small vessel atheromatous irregularity throughout the MCA branches bilaterally with associated moderate to severe multifocal stenoses, slightly worse on the left. No large vessel occlusion within the anterior circulation. Posterior circulation: Calcified plaque involving the proximal V4 segments bilaterally with associated mild short-segment stenoses. Right PICA patent. Left PICA not definitely seen. Short-segment mild stenosis noted at the proximal basilar artery. Basilar otherwise widely patent to its distal aspect. Superior cerebral arteries patent bilaterally. Both PCAs primarily supplied via the basilar. Short-segment severe near occlusive left P1 stenosis (series 8, image 20). Additional multifocal severe left P2 stenoses seen distally. Moderate to severe segmental stenoses seen within the right P2 segment as well. PCAs remain patent to their distal aspects. Venous  sinuses: Grossly patent allowing for timing the contrast bolus. Anatomic variants: None significant. Review of the MIP images confirms the above findings IMPRESSION: 1. Negative CTA for large vessel occlusion. 2. Moderate to advanced intracranial atherosclerotic disease as above. Notable findings include severe left P1 and bilateral P2 stenoses, with moderate stenoses about the carotid siphons bilaterally. Extensive small vessel atheromatous irregularity, most pronounced about the left MCA and bilateral PCA distributions. 3. 5 mm left PCOM aneurysm. 4.  Emphysema (ICD10-J43.9). 5. Approximate 15 mm left thyroid nodule. Further assessment with dedicated thyroid ultrasound recommended for further evaluation. (Ref: J Am Coll Radiol. 2015 Feb;12(2): 143-50). Electronically Signed   By: Rise Mu M.D.   On: 10/27/2019 03:17   CT ANGIO NECK W OR WO CONTRAST  Result Date: 10/27/2019 CLINICAL DATA:  Follow-up examination for acute stroke. EXAM: CT ANGIOGRAPHY HEAD AND NECK TECHNIQUE: Multidetector CT imaging of the head and neck was performed using the standard protocol during bolus administration of intravenous contrast. Multiplanar CT image reconstructions and MIPs were obtained to evaluate the vascular anatomy. Carotid stenosis measurements (when applicable) are obtained utilizing NASCET criteria, using the distal internal carotid diameter as the denominator. CONTRAST:  39mL OMNIPAQUE IOHEXOL 350 MG/ML SOLN COMPARISON:  Prior MRI from 10/26/2019. FINDINGS: CTA NECK FINDINGS Aortic arch: Visualized aortic arch of normal caliber. Origin of the great vessels incompletely visualized on this exam. Visualized subclavian arteries widely patent. Right carotid system: Right common carotid artery patent from its origin to the bifurcation without stenosis. No significant atheromatous change about the right bifurcation. Right ICA mildly tortuous  but patent to the skull base without stenosis, dissection or occlusion.  Left carotid system: Visualized left CCA patent to the bifurcation without stenosis. Minor atheromatous change about the left bifurcation without significant stenosis. Left ICA mildly tortuous but widely patent distally to the skull base without stenosis, dissection or occlusion. Vertebral arteries: Both vertebral arteries arise from the subclavian arteries. Left vertebral artery dominant. Small focal calcified plaque noted at the origin of the right vertebral artery without significant stenosis. Vertebral arteries otherwise mildly tortuous but widely patent to the skull base without stenosis, dissection or occlusion. Skeleton: No acute osseous abnormality. No discrete lytic or blastic osseous lesions. Moderate cervical spondylosis noted at C5-6. Other neck: No other acute soft tissue abnormality within the neck. Subtle heterogeneous left thyroid nodule measures up to approximately 15 mm (series 5, image 23). Few scattered subcentimeter nodes measuring up to 7 mm noted within the left supraclavicular region. No pathologically enlarged lymph nodes. Left parotid gland appears to be absent. Upper chest: Visualized upper chest demonstrates no acute finding. Centrilobular emphysema. Review of the MIP images confirms the above findings CTA HEAD FINDINGS Anterior circulation: Petrous segments patent bilaterally. Calcified atheromatous plaque throughout the carotid siphons with associated moderate multifocal narrowing (up to approximately 50% bilaterally). 5 mm saccular aneurysm at the level of the left posterior communicating artery is seen (series 9, image 90). This is directed posteriorly and slightly inferiorly. ICA termini well perfused. A1 segments patent bilaterally. Normal anterior communicating artery complex. Anterior cerebral arteries patent to their distal aspects without high-grade stenosis. Mild irregularity within the M1 segments bilaterally without high-grade stenosis. Normal MCA bifurcations. Extensive  small vessel atheromatous irregularity throughout the MCA branches bilaterally with associated moderate to severe multifocal stenoses, slightly worse on the left. No large vessel occlusion within the anterior circulation. Posterior circulation: Calcified plaque involving the proximal V4 segments bilaterally with associated mild short-segment stenoses. Right PICA patent. Left PICA not definitely seen. Short-segment mild stenosis noted at the proximal basilar artery. Basilar otherwise widely patent to its distal aspect. Superior cerebral arteries patent bilaterally. Both PCAs primarily supplied via the basilar. Short-segment severe near occlusive left P1 stenosis (series 8, image 20). Additional multifocal severe left P2 stenoses seen distally. Moderate to severe segmental stenoses seen within the right P2 segment as well. PCAs remain patent to their distal aspects. Venous sinuses: Grossly patent allowing for timing the contrast bolus. Anatomic variants: None significant. Review of the MIP images confirms the above findings IMPRESSION: 1. Negative CTA for large vessel occlusion. 2. Moderate to advanced intracranial atherosclerotic disease as above. Notable findings include severe left P1 and bilateral P2 stenoses, with moderate stenoses about the carotid siphons bilaterally. Extensive small vessel atheromatous irregularity, most pronounced about the left MCA and bilateral PCA distributions. 3. 5 mm left PCOM aneurysm. 4.  Emphysema (ICD10-J43.9). 5. Approximate 15 mm left thyroid nodule. Further assessment with dedicated thyroid ultrasound recommended for further evaluation. (Ref: J Am Coll Radiol. 2015 Feb;12(2): 143-50). Electronically Signed   By: Jeannine Boga M.D.   On: 10/27/2019 03:17   MR BRAIN WO CONTRAST  Result Date: 10/26/2019 CLINICAL DATA:  Diplopia.  Hypertension. EXAM: MRI HEAD WITHOUT CONTRAST TECHNIQUE: Multiplanar, multiecho pulse sequences of the brain and surrounding structures were  obtained without intravenous contrast. COMPARISON:  None. FINDINGS: Brain: Small foci of acute infarct in the left anterior insular cortex and the left lateral frontal lobe involving the cortex. Both of these infarcts are approximately 3 mm in size. Moderate chronic microvascular ischemic change  in the white matter. Chronic ischemic change in the basal ganglia and pons. Small chronic infarct right cerebellum. Scattered areas of microhemorrhage including the left cerebellum, left thalamus, right occipital parietal lobe. Vascular: Normal arterial flow voids Skull and upper cervical spine: Negative Sinuses/Orbits: Negative Other: None IMPRESSION: 1. 2 small areas of acute infarct in the left frontal lobe. 2. Moderate chronic microvascular ischemic change. Scattered areas of chronic microhemorrhage in the brain compatible with chronic hypertension. Electronically Signed   By: Marlan Palau M.D.   On: 10/26/2019 18:32   MR SHOULDER LEFT WO CONTRAST  Result Date: 10/05/2019 CLINICAL DATA:  Chronic left shoulder pain since fall 8 months ago. EXAM: MRI OF THE LEFT SHOULDER WITHOUT CONTRAST TECHNIQUE: Multiplanar, multisequence MR imaging of the shoulder was performed. No intravenous contrast was administered. COMPARISON:  None. FINDINGS: Rotator cuff: Intact rotator cuff. Mild supraspinatus and subscapularis tendinosis. Muscles: No atrophy or abnormal signal of the muscles of the rotator cuff. Biceps long head:  Intact and normally positioned. Acromioclavicular Joint: Mild arthropathy of the acromioclavicular joint. Type I acromion. Trace fluid in the subacromial/subdeltoid bursa. Glenohumeral Joint: Small joint effusion. No chondral defect. Labrum:  Grossly intact. Bones:  No marrow abnormality, fracture or dislocation. Other: Loss of the normal fat within the rotator interval. Thickening and irregularity of the inferior glenohumeral ligaments with edema in the axillary recess. Prominent left axillary lymph nodes  measuring up to 1.2 cm in short axis. IMPRESSION: 1. Findings suggestive of adhesive capsulitis. 2. Mild supraspinatus and subscapularis tendinosis. No rotator cuff tendon tear. 3. Mild acromioclavicular osteoarthritis. 4. Mild left axillary lymphadenopathy measuring up to 1.2 cm in short axis. Correlation with diagnostic mammogram and ultrasound recommended. Electronically Signed   By: Obie Dredge M.D.   On: 10/05/2019 09:34   ECHOCARDIOGRAM COMPLETE  Result Date: 10/27/2019    ECHOCARDIOGRAM REPORT   Patient Name:   GIANNAH ZAVADIL [facility] Date of Exam: 10/27/2019 Medical Rec #:  [id number]                    Height:       64.5 in Accession #:    [id number]                   Weight:       119.7 lb Date of Birth:  [birth year]                   BSA:          1.581 m Patient Age:    63 years                     BP:           169/107 mmHg Patient Gender: F                            HR:           66 bpm. Exam Location:  Inpatient Procedure: 2D Echo, Cardiac Doppler and Color Doppler Indications:    Stroke 434.91  History:        Patient has prior history of Echocardiogram examinations, most                 recent 07/22/2019. CHF, Abnormal ECG, Stroke; Risk                 Factors:Hypertension and Former Smoker.  Sonographer:    Renella Cunas RDCS  Referring Phys: [id number] VISHAL R PATEL IMPRESSIONS  1. Left ventricular ejection fraction, by estimation, is 50 to 55%. The left ventricle has low normal function. The left ventricle has no regional wall motion abnormalities. Left ventricular diastolic parameters are consistent with Grade I diastolic dysfunction (impaired relaxation).  2. Right ventricular systolic function is normal. The right ventricular size is normal. There is normal pulmonary artery systolic pressure.  3. The mitral valve is normal in structure. No evidence of mitral valve regurgitation. No evidence of mitral stenosis.  4. The aortic valve is normal in structure. Aortic valve regurgitation is not  visualized. No aortic stenosis is present.  5. The inferior vena cava is normal in size with greater than 50% respiratory variability, suggesting right atrial pressure of 3 mmHg. Conclusion(s)/Recommendation(s): No intracardiac source of embolism detected on this transthoracic study. A transesophageal echocardiogram is recommended to exclude cardiac source of embolism if clinically indicated. FINDINGS  Left Ventricle: Left ventricular ejection fraction, by estimation, is 50 to 55%. The left ventricle has low normal function. The left ventricle has no regional wall motion abnormalities. The left ventricular internal cavity size was normal in size. There is no left ventricular hypertrophy. Left ventricular diastolic parameters are consistent with Grade I diastolic dysfunction (impaired relaxation). Right Ventricle: The right ventricular size is normal. No increase in right ventricular wall thickness. Right ventricular systolic function is normal. There is normal pulmonary artery systolic pressure. The tricuspid regurgitant velocity is 2.27 m/s, and  with an assumed right atrial pressure of 3 mmHg, the estimated right ventricular systolic pressure is 23.6 mmHg. Left Atrium: Left atrial size was normal in size. Right Atrium: Right atrial size was normal in size. Pericardium: There is no evidence of pericardial effusion. Mitral Valve: The mitral valve is normal in structure. Normal mobility of the mitral valve leaflets. No evidence of mitral valve regurgitation. No evidence of mitral valve stenosis. Tricuspid Valve: The tricuspid valve is normal in structure. Tricuspid valve regurgitation is mild . No evidence of tricuspid stenosis. Aortic Valve: The aortic valve is normal in structure. Aortic valve regurgitation is not visualized. No aortic stenosis is present. Pulmonic Valve: The pulmonic valve was normal in structure. Pulmonic valve regurgitation is not visualized. No evidence of pulmonic stenosis. Aorta: The aortic  root is normal in size and structure. Venous: The inferior vena cava is normal in size with greater than 50% respiratory variability, suggesting right atrial pressure of 3 mmHg. IAS/Shunts: There is right bowing of the interatrial septum, suggestive of elevated left atrial pressure. No atrial level shunt detected by color flow Doppler.  LEFT VENTRICLE PLAX 2D LVIDd:         4.30 cm      Diastology LVIDs:         3.20 cm      LV e' lateral:   6.25 cm/s LV PW:         0.80 cm      LV E/e' lateral: 4.4 LV IVS:        0.80 cm      LV e' medial:    3.00 cm/s LVOT diam:     2.00 cm      LV E/e' medial:  9.1 LV SV:         40 LV SV Index:   25 LVOT Area:     3.14 cm  LV Volumes (MOD) LV vol d, MOD A2C: 93.9 ml LV vol d, MOD A4C: 114.0 ml LV vol s, MOD A2C: 44.3  ml LV vol s, MOD A4C: 63.3 ml LV SV MOD A2C:     49.6 ml LV SV MOD A4C:     114.0 ml LV SV MOD BP:      50.0 ml RIGHT VENTRICLE RV S prime:     10.90 cm/s TAPSE (M-mode): 1.8 cm LEFT ATRIUM           Index       RIGHT ATRIUM           Index LA diam:      3.20 cm 2.02 cm/m  RA Area:     12.30 cm LA Vol (A2C): 34.9 ml 22.07 ml/m RA Volume:   27.10 ml  17.14 ml/m LA Vol (A4C): 34.2 ml 21.63 ml/m  AORTIC VALVE LVOT Vmax:   74.20 cm/s LVOT Vmean:  48.500 cm/s LVOT VTI:    0.127 m  AORTA Ao Root diam: 2.80 cm MITRAL VALVE               TRICUSPID VALVE MV Area (PHT): 2.32 cm    TR Peak grad:   20.6 mmHg MV Decel Time: 327 msec    TR Vmax:        227.00 cm/s MV E velocity: 27.40 cm/s MV A velocity: 49.40 cm/s  SHUNTS MV E/A ratio:  0.55        Systemic VTI:  0.13 m                            Systemic Diam: 2.00 cm Donato Schultz MD Electronically signed by Donato Schultz MD Signature Date/Time: 10/27/2019/2:47:03 PM    Final     Subjective: Patient seen and examined.  No overnight events.  She was able to work with PT and ambulate.  Still has some dizziness.  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   Discharge Exam: Vitals:   10/28/19 0754  10/28/19 0756  BP: (!) 148/105 (!) 132/99  Pulse: 72 94  Resp: 16 18  Temp:    SpO2: 100% 99%   Vitals:   10/28/19 0430 10/28/19 0752 10/28/19 0754 10/28/19 0756  BP: (!) 149/104 (!) 153/102 (!) 148/105 (!) 132/99  Pulse: 66 66 72 94  Resp: 18 16 16 18   Temp: 98 F (36.7 C) 98.1 F (36.7 C)    TempSrc: Oral Oral    SpO2: 99% 100% 100% 99%  Weight:      Height:        General: Pt is alert, awake, not in acute distress Cardiovascular: RRR, S1/S2 +, no rubs, no gallops Respiratory: CTA bilaterally, no wheezing, no rhonchi Abdominal: Soft, NT, ND, bowel sounds + Extremities: no edema, no cyanosis No demonstrable neurological deficits.    The results of significant diagnostics from this hospitalization (including imaging, microbiology, ancillary and laboratory) are listed below for reference.     Microbiology: Recent Results (from the past 240 hour(s))  Respiratory Panel by RT PCR (Flu A&B, Covid) - Nasopharyngeal Swab     Status: None   Collection Time: 10/26/19  8:30 PM   Specimen: Nasopharyngeal Swab  Result Value Ref Range Status   SARS Coronavirus 2 by RT PCR NEGATIVE NEGATIVE Final    Comment: (NOTE) SARS-CoV-2 target nucleic acids are NOT DETECTED. The SARS-CoV-2 RNA is generally detectable in upper respiratoy specimens during the acute phase of infection. The lowest concentration of SARS-CoV-2 viral copies this assay can detect is 131 copies/mL. A  negative result does not preclude SARS-Cov-2 infection and should not be used as the sole basis for treatment or other patient management decisions. A negative result may occur with  improper specimen collection/handling, submission of specimen other than nasopharyngeal swab, presence of viral mutation(s) within the areas targeted by this assay, and inadequate number of viral copies (<131 copies/mL). A negative result must be combined with clinical observations, patient history, and epidemiological information.  The expected result is Negative. Fact Sheet for Patients:  https://www.moore.com/https://www.fda.gov/media/142436/download Fact Sheet for Healthcare Providers:  https://www.young.biz/https://www.fda.gov/media/142435/download This test is not yet ap proved or cleared by the [place] FDA and  has been authorized for detection and/or diagnosis of SARS-CoV-2 by FDA under an Emergency Use Authorization (EUA). This EUA will remain  in effect (meaning this test can be used) for the duration of the COVID-19 declaration under Section 564(b)(1) of the Act, 21 U.S.C. section 360bbb-3(b)(1), unless the authorization is terminated or revoked sooner.    Influenza A by PCR NEGATIVE NEGATIVE Final   Influenza B by PCR NEGATIVE NEGATIVE Final    Comment: (NOTE) The Xpert Xpress SARS-CoV-2/FLU/RSV assay is intended as an aid in  the diagnosis of influenza from Nasopharyngeal swab specimens and  should not be used as a sole basis for treatment. Nasal washings and  aspirates are unacceptable for Xpert Xpress SARS-CoV-2/FLU/RSV  testing. Fact Sheet for Patients: https://www.moore.com/https://www.fda.gov/media/142436/download Fact Sheet for Healthcare Providers: https://www.young.biz/https://www.fda.gov/media/142435/download This test is not yet approved or cleared by the [place] FDA and  has been authorized for detection and/or diagnosis of SARS-CoV-2 by  FDA under an Emergency Use Authorization (EUA). This EUA will remain  in effect (meaning this test can be used) for the duration of the  Covid-19 declaration under Section 564(b)(1) of the Act, 21  U.S.C. section 360bbb-3(b)(1), unless the authorization is  terminated or revoked. Performed at [facility], [address] [place], [place] [id number]      Labs: BNP (last 3 results) No results for input(s): BNP in the last 8760 hours. Basic Metabolic Panel: Recent Labs  Lab 10/26/19 1548 10/26/19 2025 10/27/19 0521  NA 141 143 137  K 4.2 3.8 3.8  CL 106 107 101  CO2 25 25 24   GLUCOSE 111* 99  104*  BUN 24* 23 21  CREATININE 1.20* 1.14* 1.25*  CALCIUM 9.9 10.3 10.0   Liver Function Tests: No results for input(s): AST, ALT, ALKPHOS, BILITOT, PROT, ALBUMIN in the last 168 hours. No results for input(s): LIPASE, AMYLASE in the last 168 hours. No results for input(s): AMMONIA in the last 168 hours. CBC: Recent Labs  Lab 10/26/19 1548 10/27/19 0521  WBC 6.3 6.6  HGB 13.6 13.6  HCT 41.7 41.3  MCV 88.2 86.6  PLT 194 213   Cardiac Enzymes: No results for input(s): CKTOTAL, CKMB, CKMBINDEX, TROPONINI in the last 168 hours. BNP: Invalid input(s): POCBNP CBG: Recent Labs  Lab 10/26/19 1551  GLUCAP 98   D-Dimer No results for input(s): DDIMER in the last 72 hours. Hgb A1c Recent Labs    10/27/19 1544  HGBA1C 5.6   Lipid Profile Recent Labs    10/27/19 1544  CHOL 207*  HDL 76  LDLCALC 101*  TRIG 148  CHOLHDL 2.7   Thyroid function studies No results for input(s): TSH, T4TOTAL, T3FREE, THYROIDAB in the last 72 hours.  Invalid input(s): FREET3 Anemia work up No results for input(s): VITAMINB12, FOLATE, FERRITIN, TIBC, IRON, RETICCTPCT in the last 72 hours. Urinalysis    Component  Value Date/Time   COLORURINE YELLOW 10/26/2019 1602   APPEARANCEUR CLEAR 10/26/2019 1602   LABSPEC 1.012 10/26/2019 1602   PHURINE 7.0 10/26/2019 1602   GLUCOSEU NEGATIVE 10/26/2019 1602   HGBUR NEGATIVE 10/26/2019 1602   BILIRUBINUR NEGATIVE 10/26/2019 1602   KETONESUR NEGATIVE 10/26/2019 1602   PROTEINUR 100 (A) 10/26/2019 1602   UROBILINOGEN 0.2 07/22/2008 1714   NITRITE NEGATIVE 10/26/2019 1602   LEUKOCYTESUR NEGATIVE 10/26/2019 1602   Sepsis Labs Invalid input(s): PROCALCITONIN,  WBC,  LACTICIDVEN Microbiology Recent Results (from the past 240 hour(s))  Respiratory Panel by RT PCR (Flu A&B, Covid) - Nasopharyngeal Swab     Status: None   Collection Time: 10/26/19  8:30 PM   Specimen: Nasopharyngeal Swab  Result Value Ref Range Status   SARS Coronavirus 2 by RT  PCR NEGATIVE NEGATIVE Final    Comment: (NOTE) SARS-CoV-2 target nucleic acids are NOT DETECTED. The SARS-CoV-2 RNA is generally detectable in upper respiratoy specimens during the acute phase of infection. The lowest concentration of SARS-CoV-2 viral copies this assay can detect is 131 copies/mL. A negative result does not preclude SARS-Cov-2 infection and should not be used as the sole basis for treatment or other patient management decisions. A negative result may occur with  improper specimen collection/handling, submission of specimen other than nasopharyngeal swab, presence of viral mutation(s) within the areas targeted by this assay, and inadequate number of viral copies (<131 copies/mL). A negative result must be combined with clinical observations, patient history, and epidemiological information. The expected result is Negative. Fact Sheet for Patients:  https://www.moore.com/ Fact Sheet for Healthcare Providers:  https://www.young.biz/ This test is not yet ap proved or cleared by the [place] FDA and  has been authorized for detection and/or diagnosis of SARS-CoV-2 by FDA under an Emergency Use Authorization (EUA). This EUA will remain  in effect (meaning this test can be used) for the duration of the COVID-19 declaration under Section 564(b)(1) of the Act, 21 U.S.C. section 360bbb-3(b)(1), unless the authorization is terminated or revoked sooner.    Influenza A by PCR NEGATIVE NEGATIVE Final   Influenza B by PCR NEGATIVE NEGATIVE Final    Comment: (NOTE) The Xpert Xpress SARS-CoV-2/FLU/RSV assay is intended as an aid in  the diagnosis of influenza from Nasopharyngeal swab specimens and  should not be used as a sole basis for treatment. Nasal washings and  aspirates are unacceptable for Xpert Xpress SARS-CoV-2/FLU/RSV  testing. Fact Sheet for Patients: https://www.moore.com/ Fact Sheet for Healthcare  Providers: https://www.young.biz/ This test is not yet approved or cleared by the [place] FDA and  has been authorized for detection and/or diagnosis of SARS-CoV-2 by  FDA under an Emergency Use Authorization (EUA). This EUA will remain  in effect (meaning this test can be used) for the duration of the  Covid-19 declaration under Section 564(b)(1) of the Act, 21  U.S.C. section 360bbb-3(b)(1), unless the authorization is  terminated or revoked. Performed at [facility], [address], [place], [place] [address]      Time coordinating discharge:  35 minutes  SIGNED:   Dorcas Carrow, MD  [facility] 10/28/2019, 10:07 AM

## 2019-10-28 NOTE — Progress Notes (Signed)
   Cardiology asked to order a 30-day cardiac event monitor to further evaluate the etiology of her stroke.   Beatriz Stallion, PA-C 10/28/19; 1:23 PM

## 2019-10-28 NOTE — TOC Transition Note (Signed)
Transition of Care [facility]) - CM/SW Discharge Note   Patient Details  Name: Aries Kasa MRN: [id number] Date of Birth: 1955-09-18  [facility] of Care [facility]) CM/SW Contact:  Kermit Balo, RN Phone Number: 10/28/2019, 10:05 AM   Clinical Narrative:    Pt discharging to sisters home. Outpatient rehab arranged with information on the AVS. Pt has transportation home.    Final next level of care: OP Rehab Barriers to Discharge: No Barriers Identified   Patient Goals and CMS Choice     Choice offered to / list presented to : Patient  Discharge Placement                       Discharge Plan and Services   Discharge Planning Services: CM Consult                                 Social Determinants of Health (SDOH) Interventions     Readmission Risk Interventions No flowsheet data found.

## 2019-10-28 NOTE — Telephone Encounter (Signed)
Patient called yesterday and left a VM that she was admitted and she had had a stroke. She left her # at the hospital. Called patient today to see how she was feeling. States she still feels a little light headed/dizzy still. Advised patient she can reach out to [place] if she has any questions. Patient will need TOC appointment with MD/APP which should be scheduled. Will also keep close eye and schedule in pharmD clinic if needed.

## 2019-11-01 ENCOUNTER — Telehealth: Payer: Self-pay | Admitting: Pharmacist

## 2019-11-01 NOTE — Telephone Encounter (Signed)
I called patient on Friday 5/7 to see how she was feeling. Patient states she was feeling a little better. She is taking the amlodipine, entresto and carvedilol. Bidil on hold. States she took her BP and it was 154/105 therefore she did take the entresto, amlodipine and carvedilol as instructed by hospitalist.  I have scheduled patient a follow up visit in clinic for 5/20. But I will call patient mid week to follow up on blood pressures now that she is back on the entresto, carvedilol and amlodipine. Will be less aggressive with BP lowering due to recent stroke and syncope event. However, I do believe the syncope event may have been caused by the fact that patient still had clonidine in her system when she took the first few doses of Bidil.

## 2019-11-02 ENCOUNTER — Encounter: Payer: Self-pay | Admitting: *Deleted

## 2019-11-02 ENCOUNTER — Other Ambulatory Visit: Payer: Self-pay | Admitting: *Deleted

## 2019-11-02 NOTE — Patient Outreach (Addendum)
[facility]) Care Management  11/02/2019  Kendra Richardson 1955/09/30 [id number]    Subjective: Telephone call to patient's home / mobile number, spoke with patient, and HIPAA verified.  Discussed [facility] Care Management Medicare EMMI Stroke Red Flag Alert follow up, patient voiced understanding, and is in agreement to follow up.   Patient states she remembers receiving EMMI automated calls.  Patient states she is doing alright, still a little weak overall, vision improving daily, no longer seeing double vision, and headache is improving.  States she has a follow up appointment with primary MD on 11/02/2019, will start outpatient neuro rehab (physical therapy, occupational therapy) on 11/09/2019, and has a follow up appointment with neurologist on 12/01/2019.  .   States she is currently working with pharmacist at [facility] for blood pressure medication management and has a follow up appointment with pharmacist on 11/11/2019.  States she has a home blood pressure monitor and currently unable to read monitor due to decreased vision from stroke. States she will have her blood pressure taken at MD office appointment today and will let her know what she need to do with blood pressure monitoring going forward.  States she is also working with physical therapist at [facility] regarding left shoulder injury due to a fall that she had on 10/25/2018 after passing out, hitting her head,  related to decrease blood pressure and blood pressure medication.  States she will follow up with [facility] physical therapy regarding coordination of shoulder therapy with stroke physical therapy if appropriate, will notify this RNCM if assistance needed with physical therapy care coordination.   Patient states she has a shoulder follow up appointment with orthopedic MD on 11/19/2019.  Patient states she is aware of signs/ symptoms to report, how to reach provider if needed after  hours, when to go to ED, and / or call 911.   States she is waiting for Dr. Fatima Sanger office to call her regarding interventional radiologist follow up appointment and will call office to follow up if she has not received a call back by end of business on 11/04/2019.   Patient voices understanding of medical diagnosis and treatment plan.  Patient states she is able to manage self care and has assistance as needed.  States she is currently unable to drive and sister provides transportation as needed. States she is accessing her Medicare benefits as needed via member services number on back of card.  Discussed Advanced Directives, advised of [employer] document scanning  system via local providers office or hospitals, patient voices understanding, and she will discuss accessing through patient's primary provider if needed in the future.  Patient states she does not have any education material, EMMI follow up, care coordination, care management, disease monitoring, transportation, community resource, or pharmacy needs at this time.  States she is very appreciative of the follow up, is in agreement  to receive 1 additional follow up call to assess for further CM needs, and is in agreement to receive [facility] Care Management EMMI follow up calls as needed.   RNCM provided contact name and number: [phone] and advised patient to contact RNCM if needed prior to next patient outreach.     Objective: Per KPN (Knowledge Performance Now, point of care tool) and chart review, patient hospitalized 10/26/2019 - 10/28/2019 for acute CVA, Acute thrombotic stroke, left ACA territory infarction.  Patient also has a history of chronic systolic heart failure, hypertension, and CKD stage IIIa.    Assessment:  Received Medicare EMMI Stroke Red Flag Alert follow up referral on 11/01/2019.  Red Flag Alert Triggers, Day #1, times 2, patient answered no to the following question: Scheduled a follow-up appointment?  Patient answered yes to the  following question: Problems setting up rehab?    EMMI follow up completed and will follow up to assess further care management needs.      Plan: RNCM will call patient for telephone outreach attempt, within 14 business days, EMMI follow up, to assess for further CM needs, and proceed with case closure, within 10 business days if no return call, after 3rd unsuccessful outreach call.     Lars Jeziorski H. Annia Friendly, BSN, [place] [facility] Telephonic CM Phone: [phone] Fax: [phone]

## 2019-11-03 NOTE — Telephone Encounter (Signed)
Called patient to follow up. States he vision is improving. Still a little weak/woozy. She saw her PCP yesterday. Blood pressure was 139/98. No medication changes made. Patient states she now can read the numbers on her machine. Will start checking at home. Patient has follow up on 5/20 in our clinic.

## 2019-11-04 NOTE — Telephone Encounter (Signed)
Patient called stating her blood pressure was 101/68. Wanted to know if that was too low. She denies any dizziness/lightheadedness. States she feels weak, but no more so than her baseline since stroke.  Advised this is an ok blood pressure as long as she is not symptomatic. To continue to monitor. She sees a Dr. Bea Laura and then will see [place] on 5/20.

## 2019-11-05 ENCOUNTER — Telehealth: Payer: Self-pay | Admitting: Pharmacist

## 2019-11-05 ENCOUNTER — Other Ambulatory Visit: Payer: Self-pay

## 2019-11-05 ENCOUNTER — Ambulatory Visit (HOSPITAL_COMMUNITY)
Admission: RE | Admit: 2019-11-05 | Discharge: 2019-11-05 | Disposition: A | Discharge status: Home / Self Care | Payer: No Typology Code available for payment source | Source: Ambulatory Visit | Attending: Neurology | Admitting: Neurology

## 2019-11-05 DIAGNOSIS — I671 Cerebral aneurysm, nonruptured: Secondary | ICD-10-CM

## 2019-11-05 NOTE — Progress Notes (Signed)
Chief Complaint: Patient was seen in consultation today for brain aneurysm  Referring Physician(s): Xu,Jindong  Supervising Physician: Julieanne Cotton  Patient Status: [facility] - Out-pt  History of Present Illness: Kendra Richardson is a 64 y.o. female with past medical history of CHF, CKD III, HTN, CHF with EF 30-35% by ECHO 07/22/19 who presented to ED 10/26/19 after episode of syncope at home possibly related to hypotensive episode resulting in decreased cerebral perfusion.  Repeat ECHO during that time showed significant improvement in her EF to 50-60% although cardioembolic source of her strokes remained feasible.   MR Brain 10/26/19 showed: 1. 2 small areas of acute infarct in the left frontal lobe. 2. Moderate chronic microvascular ischemic change. Scattered areas of chronic microhemorrhage in the brain compatible with chronic Hypertension.   CTA Head and Neck 10/26/19 showed: 1. Negative CTA for large vessel occlusion. 2. Moderate to advanced intracranial atherosclerotic disease as above. Notable findings include severe left P1 and bilateral P2 stenoses, with moderate stenoses about the carotid siphons bilaterally. Extensive small vessel atheromatous irregularity, most pronounced about the left MCA and bilateral PCA distributions. 3. 5 mm left PCOM aneurysm.  She presents to [facility] clinic today as a referral from Dr. 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  She is accompanied by her sister today who has been helping her at home since discharge. She is in a wheelchair today due to fatigue and gluteal pain, but otherwise has no residual effects from her syncopal event. She states the gluteal pain is new since discharge and has been getting worse. She has not had recurrence of dizziness, lightheadedness, or syncope since her inciting event. She has plans to initiate physical therapy next week.  She has a heart monitor she  received in the mail. Her next scheduled follow-up with Neurology is 12/01/19.   Upon review of her history, she reports her father had a debilitating stroke which she does not believe was related to an aneurysm. She has been a daily smoker for several years, but has not had a cigarette since discharge from the hospital.  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  She has always been thin and of small frame. Reports she is trying to increase her PO intake and promote weight gain. She was down to 104 lbs in the recent past, now has gained to a more appropriate weight of 115-119 lbs.   Past Medical History:  Diagnosis Date  . Abnormal radiographic examination   . CHF (congestive heart failure) (HCC)   . Chronic kidney disease, stage III (moderate)   . Chronic kidney disease, stage III (moderate)   . CN (constipation)   . Essential hypertension, malignant   . HTN (hypertension)   . LVH (left ventricular hypertrophy)   . Protein-calorie malnutrition (HCC)     Past Surgical History:  Procedure Laterality Date  . BREAST EXCISIONAL BIOPSY Left     Allergies: Patient has no known allergies.  Medications: Prior to Admission medications   Medication Sig Start Date End Date Taking? Authorizing Provider  amLODipine (NORVASC) 10 MG tablet Take 10 mg by mouth daily.    [provider]  aspirin EC 81 MG tablet Take 1 tablet (81 mg total) by mouth daily. 10/28/19 01/26/20  Dorcas Carrow, MD  atorvastatin (LIPITOR) 40 MG tablet Take 1 tablet (40 mg total) by mouth daily. 10/29/19 01/27/20  Dorcas Carrow, MD  carvedilol (COREG) 25 MG  tablet Take 1 tablet (25 mg total) by mouth 2 (two) times daily. 09/17/19   Jake Bathe, MD  clopidogrel (PLAVIX) 75 MG tablet Take 1 tablet (75 mg total) by mouth daily. 10/29/19 01/27/20  Dorcas Carrow, MD  Multiple Vitamin (MULTIVITAMIN) tablet Take 1 tablet by mouth daily.    [provider]  sacubitril-valsartan (ENTRESTO) 97-103  MG Take 1 tablet by mouth 2 (two) times daily. 08/19/19   Jake Bathe, MD     Family History  Problem Relation Age of Onset  . Breast cancer Mother   . Breast cancer Maternal Aunt   . Stroke Father   . Kidney disease Father     Social History   Socioeconomic History  . Marital status: Single    Spouse name: Not on file  . Number of children: Not on file  . Years of education: Not on file  . Highest education level: Not on file  Occupational History  . Not on file  Tobacco Use  . Smoking status: Former Smoker  Substance and Sexual Activity  . Alcohol use: Not on file  . Drug use: Not on file  . Sexual activity: Not on file  Other Topics Concern  . Not on file  Social History Narrative  . Not on file   Social Determinants of Health   Financial Resource Strain:   . Difficulty of Paying Living Expenses:   Food Insecurity:   . Worried About Programme researcher, broadcasting/film/video in the Last Year:   . Barista in the Last Year:   Transportation Needs: No Transportation Needs  . Lack of Transportation (Medical): No  . Lack of Transportation (Non-Medical): No  Physical Activity:   . Days of Exercise per Week:   . Minutes of Exercise per Session:   Stress:   . Feeling of Stress :   Social Connections:   . Frequency of Communication with Friends and Family:   . Frequency of Social Gatherings with Friends and Family:   . Attends Religious Services:   . Active Member of Clubs or Organizations:   . Attends Banker Meetings:   Marland Kitchen Marital Status:      Review of Systems: A 12 point ROS discussed and pertinent positives are indicated in the HPI above.  All other systems are negative.  Review of Systems  Constitutional: Positive for activity change and fatigue. Negative for fever.  Respiratory: Negative for cough and shortness of breath.   Cardiovascular: Negative for chest pain.  Gastrointestinal: Negative for abdominal pain, nausea and vomiting.  Genitourinary:  Negative for dysuria.  Musculoskeletal: Negative for back pain.  Neurological: Positive for headaches (mild-moderate left-sided frontal headaches). Negative for facial asymmetry.  Psychiatric/Behavioral: Negative for behavioral problems and confusion.    Vital Signs: There were no vitals taken for this visit.  Physical Exam Vitals and nursing note reviewed.  Constitutional:      General: She is not in acute distress.    Appearance: Normal appearance.  HENT:     Mouth/Throat:     Mouth: Mucous membranes are moist.     Pharynx: Oropharynx is clear.  Eyes:     Extraocular Movements: Extraocular movements intact.     Pupils: Pupils are equal, round, and reactive to light.  Pulmonary:     Effort: Pulmonary effort is normal.  Skin:    General: Skin is warm and dry.  Neurological:     General: No focal deficit present.  Mental Status: She is alert and oriented to person, place, and time.  Psychiatric:        Mood and Affect: Mood normal.        Behavior: Behavior normal.        Thought Content: Thought content normal.        Judgment: Judgment normal.     Imaging: CT ANGIO HEAD W OR WO CONTRAST  Result Date: 10/27/2019 CLINICAL DATA:  Follow-up examination for acute stroke. EXAM: CT ANGIOGRAPHY HEAD AND NECK TECHNIQUE: Multidetector CT imaging of the head and neck was performed using the standard protocol during bolus administration of intravenous contrast. Multiplanar CT image reconstructions and MIPs were obtained to evaluate the vascular anatomy. Carotid stenosis measurements (when applicable) are obtained utilizing NASCET criteria, using the distal internal carotid diameter as the denominator. CONTRAST:  67mL OMNIPAQUE IOHEXOL 350 MG/ML SOLN COMPARISON:  Prior MRI from 10/26/2019. FINDINGS: CTA NECK FINDINGS Aortic arch: Visualized aortic arch of normal caliber. Origin of the great vessels incompletely visualized on this exam. Visualized subclavian arteries widely patent. Right  carotid system: Right common carotid artery patent from its origin to the bifurcation without stenosis. No significant atheromatous change about the right bifurcation. Right ICA mildly tortuous but patent to the skull base without stenosis, dissection or occlusion. Left carotid system: Visualized left CCA patent to the bifurcation without stenosis. Minor atheromatous change about the left bifurcation without significant stenosis. Left ICA mildly tortuous but widely patent distally to the skull base without stenosis, dissection or occlusion. Vertebral arteries: Both vertebral arteries arise from the subclavian arteries. Left vertebral artery dominant. Small focal calcified plaque noted at the origin of the right vertebral artery without significant stenosis. Vertebral arteries otherwise mildly tortuous but widely patent to the skull base without stenosis, dissection or occlusion. Skeleton: No acute osseous abnormality. No discrete lytic or blastic osseous lesions. Moderate cervical spondylosis noted at C5-6. Other neck: No other acute soft tissue abnormality within the neck. Subtle heterogeneous left thyroid nodule measures up to approximately 15 mm (series 5, image 23). Few scattered subcentimeter nodes measuring up to 7 mm noted within the left supraclavicular region. No pathologically enlarged lymph nodes. Left parotid gland appears to be absent. Upper chest: Visualized upper chest demonstrates no acute finding. Centrilobular emphysema. Review of the MIP images confirms the above findings CTA HEAD FINDINGS Anterior circulation: Petrous segments patent bilaterally. Calcified atheromatous plaque throughout the carotid siphons with associated moderate multifocal narrowing (up to approximately 50% bilaterally). 5 mm saccular aneurysm at the level of the left posterior communicating artery is seen (series 9, image 90). This is directed posteriorly and slightly inferiorly. ICA termini well perfused. A1 segments patent  bilaterally. Normal anterior communicating artery complex. Anterior cerebral arteries patent to their distal aspects without high-grade stenosis. Mild irregularity within the M1 segments bilaterally without high-grade stenosis. Normal MCA bifurcations. Extensive small vessel atheromatous irregularity throughout the MCA branches bilaterally with associated moderate to severe multifocal stenoses, slightly worse on the left. No large vessel occlusion within the anterior circulation. Posterior circulation: Calcified plaque involving the proximal V4 segments bilaterally with associated mild short-segment stenoses. Right PICA patent. Left PICA not definitely seen. Short-segment mild stenosis noted at the proximal basilar artery. Basilar otherwise widely patent to its distal aspect. Superior cerebral arteries patent bilaterally. Both PCAs primarily supplied via the basilar. Short-segment severe near occlusive left P1 stenosis (series 8, image 20). Additional multifocal severe left P2 stenoses seen distally. Moderate to severe segmental stenoses seen within the right P2  segment as well. PCAs remain patent to their distal aspects. Venous sinuses: Grossly patent allowing for timing the contrast bolus. Anatomic variants: None significant. Review of the MIP images confirms the above findings IMPRESSION: 1. Negative CTA for large vessel occlusion. 2. Moderate to advanced intracranial atherosclerotic disease as above. Notable findings include severe left P1 and bilateral P2 stenoses, with moderate stenoses about the carotid siphons bilaterally. Extensive small vessel atheromatous irregularity, most pronounced about the left MCA and bilateral PCA distributions. 3. 5 mm left PCOM aneurysm. 4.  Emphysema (ICD10-J43.9). 5. Approximate 15 mm left thyroid nodule. Further assessment with dedicated thyroid ultrasound recommended for further evaluation. (Ref: J Am Coll Radiol. 2015 Feb;12(2): 143-50). Electronically Signed   By: Rise Mu M.D.   On: 10/27/2019 03:17   CT ANGIO NECK W OR WO CONTRAST  Result Date: 10/27/2019 CLINICAL DATA:  Follow-up examination for acute stroke. EXAM: CT ANGIOGRAPHY HEAD AND NECK TECHNIQUE: Multidetector CT imaging of the head and neck was performed using the standard protocol during bolus administration of intravenous contrast. Multiplanar CT image reconstructions and MIPs were obtained to evaluate the vascular anatomy. Carotid stenosis measurements (when applicable) are obtained utilizing NASCET criteria, using the distal internal carotid diameter as the denominator. CONTRAST:  75mL OMNIPAQUE IOHEXOL 350 MG/ML SOLN COMPARISON:  Prior MRI from 10/26/2019. FINDINGS: CTA NECK FINDINGS Aortic arch: Visualized aortic arch of normal caliber. Origin of the great vessels incompletely visualized on this exam. Visualized subclavian arteries widely patent. Right carotid system: Right common carotid artery patent from its origin to the bifurcation without stenosis. No significant atheromatous change about the right bifurcation. Right ICA mildly tortuous but patent to the skull base without stenosis, dissection or occlusion. Left carotid system: Visualized left CCA patent to the bifurcation without stenosis. Minor atheromatous change about the left bifurcation without significant stenosis. Left ICA mildly tortuous but widely patent distally to the skull base without stenosis, dissection or occlusion. Vertebral arteries: Both vertebral arteries arise from the subclavian arteries. Left vertebral artery dominant. Small focal calcified plaque noted at the origin of the right vertebral artery without significant stenosis. Vertebral arteries otherwise mildly tortuous but widely patent to the skull base without stenosis, dissection or occlusion. Skeleton: No acute osseous abnormality. No discrete lytic or blastic osseous lesions. Moderate cervical spondylosis noted at C5-6. Other neck: No other acute soft tissue  abnormality within the neck. Subtle heterogeneous left thyroid nodule measures up to approximately 15 mm (series 5, image 23). Few scattered subcentimeter nodes measuring up to 7 mm noted within the left supraclavicular region. No pathologically enlarged lymph nodes. Left parotid gland appears to be absent. Upper chest: Visualized upper chest demonstrates no acute finding. Centrilobular emphysema. Review of the MIP images confirms the above findings CTA HEAD FINDINGS Anterior circulation: Petrous segments patent bilaterally. Calcified atheromatous plaque throughout the carotid siphons with associated moderate multifocal narrowing (up to approximately 50% bilaterally). 5 mm saccular aneurysm at the level of the left posterior communicating artery is seen (series 9, image 90). This is directed posteriorly and slightly inferiorly. ICA termini well perfused. A1 segments patent bilaterally. Normal anterior communicating artery complex. Anterior cerebral arteries patent to their distal aspects without high-grade stenosis. Mild irregularity within the M1 segments bilaterally without high-grade stenosis. Normal MCA bifurcations. Extensive small vessel atheromatous irregularity throughout the MCA branches bilaterally with associated moderate to severe multifocal stenoses, slightly worse on the left. No large vessel occlusion within the anterior circulation. Posterior circulation: Calcified plaque involving the proximal V4 segments bilaterally  with associated mild short-segment stenoses. Right PICA patent. Left PICA not definitely seen. Short-segment mild stenosis noted at the proximal basilar artery. Basilar otherwise widely patent to its distal aspect. Superior cerebral arteries patent bilaterally. Both PCAs primarily supplied via the basilar. Short-segment severe near occlusive left P1 stenosis (series 8, image 20). Additional multifocal severe left P2 stenoses seen distally. Moderate to severe segmental stenoses seen  within the right P2 segment as well. PCAs remain patent to their distal aspects. Venous sinuses: Grossly patent allowing for timing the contrast bolus. Anatomic variants: None significant. Review of the MIP images confirms the above findings IMPRESSION: 1. Negative CTA for large vessel occlusion. 2. Moderate to advanced intracranial atherosclerotic disease as above. Notable findings include severe left P1 and bilateral P2 stenoses, with moderate stenoses about the carotid siphons bilaterally. Extensive small vessel atheromatous irregularity, most pronounced about the left MCA and bilateral PCA distributions. 3. 5 mm left PCOM aneurysm. 4.  Emphysema (ICD10-J43.9). 5. Approximate 15 mm left thyroid nodule. Further assessment with dedicated thyroid ultrasound recommended for further evaluation. (Ref: J Am Coll Radiol. 2015 Feb;12(2): 143-50). Electronically Signed   By: Rise MuBenjamin  McClintock M.D.   On: 10/27/2019 03:17   MR BRAIN WO CONTRAST  Result Date: 10/26/2019 CLINICAL DATA:  Diplopia.  Hypertension. EXAM: MRI HEAD WITHOUT CONTRAST TECHNIQUE: Multiplanar, multiecho pulse sequences of the brain and surrounding structures were obtained without intravenous contrast. COMPARISON:  None. FINDINGS: Brain: Small foci of acute infarct in the left anterior insular cortex and the left lateral frontal lobe involving the cortex. Both of these infarcts are approximately 3 mm in size. Moderate chronic microvascular ischemic change in the white matter. Chronic ischemic change in the basal ganglia and pons. Small chronic infarct right cerebellum. Scattered areas of microhemorrhage including the left cerebellum, left thalamus, right occipital parietal lobe. Vascular: Normal arterial flow voids Skull and upper cervical spine: Negative Sinuses/Orbits: Negative Other: None IMPRESSION: 1. 2 small areas of acute infarct in the left frontal lobe. 2. Moderate chronic microvascular ischemic change. Scattered areas of chronic  microhemorrhage in the brain compatible with chronic hypertension. Electronically Signed   By: Marlan Palauharles  Clark M.D.   On: 10/26/2019 18:32   ECHOCARDIOGRAM COMPLETE  Result Date: 10/27/2019    ECHOCARDIOGRAM REPORT   Patient Name:   Kendra Richardson Date of Exam: 10/27/2019 Medical Rec #:  [id number]                    Height:       64.5 in Accession #:    [id number]                   Weight:       119.7 lb Date of Birth:  Feb 21, 1956                   BSA:          1.581 m Patient Age:    63 years                     BP:           169/107 mmHg Patient Gender: F                            HR:           66 bpm. Exam Location:  Inpatient Procedure: 2D Echo, Cardiac Doppler and Color Doppler Indications:  Stroke 434.91  History:        Patient has prior history of Echocardiogram examinations, most                 recent 07/22/2019. CHF, Abnormal ECG, Stroke; Risk                 Factors:Hypertension and Former Smoker.  Sonographer:    Renella Cunas RDCS Referring Phys: [id number] VISHAL R PATEL IMPRESSIONS  1. Left ventricular ejection fraction, by estimation, is 50 to 55%. The left ventricle has low normal function. The left ventricle has no regional wall motion abnormalities. Left ventricular diastolic parameters are consistent with Grade I diastolic dysfunction (impaired relaxation).  2. Right ventricular systolic function is normal. The right ventricular size is normal. There is normal pulmonary artery systolic pressure.  3. The mitral valve is normal in structure. No evidence of mitral valve regurgitation. No evidence of mitral stenosis.  4. The aortic valve is normal in structure. Aortic valve regurgitation is not visualized. No aortic stenosis is present.  5. The inferior vena cava is normal in size with greater than 50% respiratory variability, suggesting right atrial pressure of 3 mmHg. Conclusion(s)/Recommendation(s): No intracardiac source of embolism detected on this transthoracic study. A  transesophageal echocardiogram is recommended to exclude cardiac source of embolism if clinically indicated. FINDINGS  Left Ventricle: Left ventricular ejection fraction, by estimation, is 50 to 55%. The left ventricle has low normal function. The left ventricle has no regional wall motion abnormalities. The left ventricular internal cavity size was normal in size. There is no left ventricular hypertrophy. Left ventricular diastolic parameters are consistent with Grade I diastolic dysfunction (impaired relaxation). Right Ventricle: The right ventricular size is normal. No increase in right ventricular wall thickness. Right ventricular systolic function is normal. There is normal pulmonary artery systolic pressure. The tricuspid regurgitant velocity is 2.27 m/s, and  with an assumed right atrial pressure of 3 mmHg, the estimated right ventricular systolic pressure is 23.6 mmHg. Left Atrium: Left atrial size was normal in size. Right Atrium: Right atrial size was normal in size. Pericardium: There is no evidence of pericardial effusion. Mitral Valve: The mitral valve is normal in structure. Normal mobility of the mitral valve leaflets. No evidence of mitral valve regurgitation. No evidence of mitral valve stenosis. Tricuspid Valve: The tricuspid valve is normal in structure. Tricuspid valve regurgitation is mild . No evidence of tricuspid stenosis. Aortic Valve: The aortic valve is normal in structure. Aortic valve regurgitation is not visualized. No aortic stenosis is present. Pulmonic Valve: The pulmonic valve was normal in structure. Pulmonic valve regurgitation is not visualized. No evidence of pulmonic stenosis. Aorta: The aortic root is normal in size and structure. Venous: The inferior vena cava is normal in size with greater than 50% respiratory variability, suggesting right atrial pressure of 3 mmHg. IAS/Shunts: There is right bowing of the interatrial septum, suggestive of elevated left atrial pressure. No  atrial level shunt detected by color flow Doppler.  LEFT VENTRICLE PLAX 2D LVIDd:         4.30 cm      Diastology LVIDs:         3.20 cm      LV e' lateral:   6.25 cm/s LV PW:         0.80 cm      LV E/e' lateral: 4.4 LV IVS:        0.80 cm      LV e' medial:  3.00 cm/s LVOT diam:     2.00 cm      LV E/e' medial:  9.1 LV SV:         40 LV SV Index:   25 LVOT Area:     3.14 cm  LV Volumes (MOD) LV vol d, MOD A2C: 93.9 ml LV vol d, MOD A4C: 114.0 ml LV vol s, MOD A2C: 44.3 ml LV vol s, MOD A4C: 63.3 ml LV SV MOD A2C:     49.6 ml LV SV MOD A4C:     114.0 ml LV SV MOD BP:      50.0 ml RIGHT VENTRICLE RV S prime:     10.90 cm/s TAPSE (M-mode): 1.8 cm LEFT ATRIUM           Index       RIGHT ATRIUM           Index LA diam:      3.20 cm 2.02 cm/m  RA Area:     12.30 cm LA Vol (A2C): 34.9 ml 22.07 ml/m RA Volume:   27.10 ml  17.14 ml/m LA Vol (A4C): 34.2 ml 21.63 ml/m  AORTIC VALVE LVOT Vmax:   74.20 cm/s LVOT Vmean:  48.500 cm/s LVOT VTI:    0.127 m  AORTA Ao Root diam: 2.80 cm MITRAL VALVE               TRICUSPID VALVE MV Area (PHT): 2.32 cm    TR Peak grad:   20.6 mmHg MV Decel Time: 327 msec    TR Vmax:        227.00 cm/s MV E velocity: 27.40 cm/s MV A velocity: 49.40 cm/s  SHUNTS MV E/A ratio:  0.55        Systemic VTI:  0.13 m                            Systemic Diam: 2.00 cm Donato Schultz MD Electronically signed by Donato Schultz MD Signature Date/Time: 10/27/2019/2:47:03 PM    Final     Labs:  CBC: Recent Labs    10/26/19 1548 10/27/19 0521  WBC 6.3 6.6  HGB 13.6 13.6  HCT 41.7 41.3  PLT 194 213    COAGS: No results for input(s): INR, APTT in the last 8760 hours.  BMP: Recent Labs    09/02/19 0923 10/26/19 1548 10/26/19 2025 10/27/19 0521  NA 141 141 143 137  K 4.5 4.2 3.8 3.8  CL 101 106 107 101  CO2 25 25 25 24   GLUCOSE 102* 111* 99 104*  BUN 35* 24* 23 21  CALCIUM 9.9 9.9 10.3 10.0  CREATININE 1.55* 1.20* 1.14* 1.25*  GFRNONAA 35* 47* 50* 45*  GFRAA 40* 55* 58* 52*    LIVER  FUNCTION TESTS: No results for input(s): BILITOT, AST, ALT, ALKPHOS, PROT, ALBUMIN in the last 8760 hours.  TUMOR MARKERS: No results for input(s): AFPTM, CEA, CA199, CHROMGRNA in the last 8760 hours.  Assessment and Plan: 3.5 mm left PCOM aneurysm Ms. Creamer presents after recent admission for syncopal even at home.  She was incidentally found to have a 3.83mm L PCOM aneurysm which is not likely the cause of her symptoms.  She presents to [facility] today to discuss this aneurysm as well as other findings on her head imaging. Discussed nature of aneurysms, including risks and benefits. Explained that there are two management options moving forward- either continued conservative management including routine imaging scans  to monitor for changes, or with an endovascular embolization procedure (coils and/or stent placement). Explained procedure, including risks and benefits. Explained that embolization will eliminate risk of rupture of aneurysm. Explained that if patient wishes to move forward, he will need to remain on DAPT. Patient expresses desire to move forward with procedure. Continue aspirin 81 mg and Plavix 75 mg PO daily.  Continue ongoing efforts with rehab to improve her weight and functional status prior to proceeding with intervention.  She and sister are aware they can contact our office within the next few weeks with any updates re: her status and plans to proceed.  At this time, diagnostic angiogram with intent to treat would be preferred by the patient and can be arranged once she is ready to proceed.   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 Corliss Skains discussed the presence of several areas of narrowing found on imaging.  These are not likely the cause of her syncope, however now that they have been found, they do warrant monitoring and possible intervention.  Aneurysm treatment will take precedence for now given that she is  asymptomatic of her stenosis.  She should continue DAPT at this time.  Also discussed the possibly of her new statin as the cause of her myalgia.  Recommended she contact her PCP to discuss changing her medication if they agree this is a potential cause of her gluteal pain.  Thank you for this interesting consult.  I greatly enjoyed meeting [facility] and look forward to participating in their care.  A copy of this report was sent to the requesting provider on this date.  Electronically Signed: Hoyt Koch, PA 11/05/2019, 2:28 PM   I spent a total of  30 Minutes   in face to face in clinical consultation, greater than 50% of which was counseling/coordinating care for brain aneurysm, intra-cranial stenosis.

## 2019-11-05 NOTE — Telephone Encounter (Signed)
Patient called, received a heart monitor but does not know how to put it on or wear it.  Would like a call back from a nurse.  Cell phone number

## 2019-11-07 ENCOUNTER — Encounter (INDEPENDENT_AMBULATORY_CARE_PROVIDER_SITE_OTHER): Payer: No Typology Code available for payment source

## 2019-11-07 ENCOUNTER — Encounter: Payer: Self-pay | Admitting: Cardiology

## 2019-11-07 DIAGNOSIS — I639 Cerebral infarction, unspecified: Secondary | ICD-10-CM

## 2019-11-07 DIAGNOSIS — R42 Dizziness and giddiness: Secondary | ICD-10-CM

## 2019-11-09 ENCOUNTER — Other Ambulatory Visit: Payer: Self-pay

## 2019-11-09 ENCOUNTER — Ambulatory Visit: Payer: No Typology Code available for payment source | Attending: Internal Medicine

## 2019-11-09 ENCOUNTER — Ambulatory Visit: Payer: No Typology Code available for payment source | Admitting: Occupational Therapy

## 2019-11-09 DIAGNOSIS — I639 Cerebral infarction, unspecified: Secondary | ICD-10-CM | POA: Diagnosis present

## 2019-11-09 DIAGNOSIS — R2681 Unsteadiness on feet: Secondary | ICD-10-CM | POA: Diagnosis present

## 2019-11-09 DIAGNOSIS — M6281 Muscle weakness (generalized): Secondary | ICD-10-CM

## 2019-11-09 DIAGNOSIS — R2689 Other abnormalities of gait and mobility: Secondary | ICD-10-CM

## 2019-11-09 DIAGNOSIS — R41842 Visuospatial deficit: Secondary | ICD-10-CM

## 2019-11-09 DIAGNOSIS — M25612 Stiffness of left shoulder, not elsewhere classified: Secondary | ICD-10-CM | POA: Insufficient documentation

## 2019-11-09 DIAGNOSIS — R41840 Attention and concentration deficit: Secondary | ICD-10-CM

## 2019-11-09 NOTE — Telephone Encounter (Signed)
Tried calling to offer patient appt to come in later today to have monitor applied. Unable to reach her.

## 2019-11-09 NOTE — Therapy (Addendum)
[facility] [address] [place], [place], [address] Phone: [phone]   Fax:  [phone]  Physical Therapy Evaluation  Patient Details  Name: Kendra Richardson MRN: [id number] Date of Birth: [birth year] No data recorded  Encounter Date: 11/09/2019  PT End of Session - 11/09/19 1527    Visit Number  1    Number of Visits  9    Date for PT Re-Evaluation  01/04/20    PT Start Time  1400    PT Stop Time  1450    PT Time Calculation (min)  50 min    Equipment Utilized During Treatment  Gait belt    Activity Tolerance  Patient tolerated treatment well    Behavior During Therapy  [facility] for tasks assessed/performed       Past Medical History:  Diagnosis Date  . Abnormal radiographic examination   . CHF (congestive heart failure) (HCC)   . Chronic kidney disease, stage III (moderate)   . Chronic kidney disease, stage III (moderate)   . CN (constipation)   . Essential hypertension, malignant   . HTN (hypertension)   . LVH (left ventricular hypertrophy)   . Protein-calorie malnutrition (HCC)     Past Surgical History:  Procedure Laterality Date  . BREAST EXCISIONAL BIOPSY Left     There were no vitals filed for this visit.   Subjective Assessment - 11/09/19 1514    Subjective  10/26/19: she passed out and hit her head. She was feeling dizzy and lightheaded. Before this even, she had recent change in her heart medications. She saw PCP and was recommended to go to ED. MRI in ED showed 2 small areas of acute infarction in the left frontal lobe along with moderate chronic microvascular ischemic changes with scattered areas of chronic microhemorrhage in the brain compatible with chronic longstanding severe hypertension. Pt is reporting bil gluteal peain and she reported that her MD stated that it could be sideeffects of one of her meds. Pt was reporting constant headaches since coming out of ED. Her headaches have  lessened some. Pt reports she gets double vision if she sleeps on her left side. She gets dizzy if she sleeps on her back She watches TV while lying on her R side.    Patient is accompained by:  Family member   Sister Elease Hashimoto)   Pertinent History  : HTN, CKD stage III    Limitations  Walking    How long can you sit comfortably?  no issues    How long can you stand comfortably?  10 min    How long can you walk comfortably?  5 min    Diagnostic tests  10/26/19: MRI of brain: IMPRESSION:1. 2 small areas of acute infarct in the left frontal lobe.2. Moderate chronic microvascular ischemic change. Scattered areasof chronic microhemorrhage in the brain compatible with chronichypertension. . Negative CTA for large vessel occlusion.2. Moderate to advanced intracranial atherosclerotic disease asabove. Notable findings include severe left P1 and bilateral P2stenoses, with moderate stenoses about the carotid siphonsbilaterally. Extensive small vessel atheromatous irregularity, mostpronounced about the left MCA and bilateral PCA distributions.3. 5 mm left PCOM aneurysm.    Patient Stated Goals  Walk better    Currently in Pain?  No/denies    Pain Onset  1 to 4 weeks ago    Pain Frequency  Intermittent    Aggravating Factors   sitting, bending forward         [facility] PT Assessment - 11/10/19  1044      Precautions   Precautions  Fall      Prior Function   Level of Independence  Independent    Vocation  Retired      Microbiologist  --   denies changes     ROM / Strength   AROM / PROM / Strength  Strength      Strength   Overall Strength  Within functional limits for tasks performed   bil hip flex/abd/add; knee flex/ext, ankle DF 5/5     Ambulation/Gait   Ambulation/Gait  Yes    Ambulation/Gait Assistance  7: Independent    Ambulation Distance (Feet)  115 Feet    Assistive device  None    Gait Pattern  Decreased trunk rotation   decreased cadence     Standardized Balance Assessment    Standardized Balance Assessment  Five Times Sit to Stand;Berg Balance Test    Five times sit to stand comments   18      Berg Balance Test   Sit to Stand  Able to stand without using hands and stabilize independently    Standing Unsupported  Able to stand safely 2 minutes    Sitting with Back Unsupported but Feet Supported on Floor or Stool  Able to sit safely and securely 2 minutes    Stand to Sit  Sits safely with minimal use of hands    Transfers  Able to transfer safely, minor use of hands    Standing Unsupported with Eyes Closed  Able to stand 10 seconds safely    Standing Unsupported with Feet Together  Able to place feet together independently and stand 1 minute safely    From Standing, Reach Forward with Outstretched Arm  Can reach confidently >25 cm (10")    From Standing Position, Pick up Object from Floor  Able to pick up shoe safely and easily    From Standing Position, Turn to Look Behind Over each Shoulder  Looks behind from both sides and weight shifts well    Turn 360 Degrees  Able to turn 360 degrees safely in 4 seconds or less    Standing Unsupported, Alternately Place Feet on Step/Stool  Able to stand independently and safely and complete 8 steps in 20 seconds    Standing Unsupported, One Foot in Front  Able to place foot tandem independently and hold 30 seconds    Standing on One Leg  Able to lift leg independently and hold > 10 seconds    Total Score  56                  Objective measurements completed on examination: See above findings.              PT Education - 11/09/19 1518    Education Details  Pt educated on evaluation findings and POC. Educated on benefits of walking prgoram. She was discussed her goal of walking 30 min a day by end of 5 weeks. Pt educated on walking with her sister until she feels more comfortable. Reviewed HEP    Person(s) Educated  Patient;Other (comment)   Sister   Methods  Explanation;Demonstration    Comprehension   Verbalized understanding;Returned demonstration       PT Short Term Goals - 11/09/19 1519      PT SHORT TERM GOAL #1   Title  Patient will be able to ambulate 20 min without AD and without significant gait deviations or SOB to improve  walking endurance.    Baseline  5 min    Time  4    Period  Weeks    Status  New    Target Date  12/07/19      PT SHORT TERM GOAL #2   Title  Pt will be able to carry 10-15 lb weight in hand to be able to carry grocery bags from car to house, about 200 feet    Baseline  sister helping with groceries    Time  4    Period  Weeks    Status  New    Target Date  12/07/19        PT Long Term Goals - 11/09/19 1519      PT LONG TERM GOAL #1   Title  Patient will be able to carry a laundry basket up stairs to her room on the stairs I to return to PLOF    Baseline  Sister is helping out with ADLs.    Time  8    Period  Weeks    Status  New    Target Date  01/04/20      PT LONG TERM GOAL #2   Title  Pt will be able to ambulate for 30 min independently without any significant gait deviations to return to PLOF    Baseline  5 min    Time  8    Period  Weeks    Status  New    Target Date  01/04/20      PT LONG TERM GOAL #3   Title  Pt will be I and compliant with HEP to self manage her symptoms    Baseline  Not perfomring exercises    Time  8    Period  Weeks    Status  New    Target Date  01/04/20       TherEx: Piriformis stretch: 3 x 30" R and L Walking program education      Plan - 11/09/19 1522    Clinical Impression Statement  Patient is a 64 y.o. female who was seen today for physical therapy evaluation and treatment for generalized deconditioning after CVA 10/26/19. Patient's objective impairments include decreased walking endurance with functional activities and ambulation. Patient will benefit from skilled PT to address these impairments and improve overall function.    Personal Factors and Comorbidities  Comorbidity 2     Comorbidities  HTN, CKD stage III    Examination-Activity Limitations  Caring for Others;Carry;Squat    Examination-Participation Restrictions  Cleaning;Community Activity;Driving;Laundry;Shop;Yard Work;Meal Prep    Stability/Clinical Decision Making  Evolving/Moderate complexity    Clinical Decision Making  Moderate    Rehab Potential  Good    PT Frequency  1x / week    PT Duration  8 weeks    PT Treatment/Interventions  ADLs/Self Care Home Management;Gait training;Stair training;Functional mobility training;Therapeutic activities;Therapeutic exercise;Balance training;Neuromuscular re-education;Patient/family education    PT Next Visit Plan  6 MWT, add hamstring stretching, review piriformis stretching for pain relief    PT Home Exercise Plan  Piriformis stretch; walking program (goal 30 min /day in 5 weeks)    Consulted and Agree with Plan of Care  Patient;Family member/caregiver    Family Member Consulted  sister (patricia)       Patient will benefit from skilled therapeutic intervention in order to improve the following deficits and impairments:  Abnormal gait, Decreased activity tolerance, Decreased endurance, Decreased mobility, Difficulty walking, Pain  Visit Diagnosis: Other abnormalities of  gait and mobility - Plan: PT plan of care cert/re-cert  Unsteadiness on feet - Plan: PT plan of care cert/re-cert     Problem List Patient Active Problem List   Diagnosis Date Noted  . Acute cerebrovascular accident (CVA) (HCC) 10/26/2019  . Chronic kidney disease, stage III (moderate)   . HFrEF (heart failure with reduced ejection fraction) (HCC) 08/05/2019  . Essential hypertension 07/07/2019  . Nonspecific abnormal electrocardiogram (ECG) (EKG) 07/07/2019    Ileana Ladd, PT 11/10/2019, 10:49 AM  [facility] [address] [place], [place], [address] Phone: [phone]   Fax:  [phone]  Name: Kendra Richardson MRN: [id number] Date of Birth: May 24, 1956

## 2019-11-10 ENCOUNTER — Encounter: Payer: Self-pay | Admitting: Occupational Therapy

## 2019-11-10 NOTE — Therapy (Signed)
[facility] [address] [place], [place], [address] Phone: [phone]   Fax:  [phone]  Occupational Therapy Evaluation  Patient Details  Name: Kendra Richardson MRN: [id number] Date of Birth: Apr 25, 1956 Referring Provider (OT): Dr. Tenny Craw, PCP (Dr. Dina Rich- hospitalist)   Encounter Date: 11/09/2019  OT End of Session - 11/10/19 1340    Visit Number  1    Number of Visits  25    Date for OT Re-Evaluation  02/08/20    Authorization Type  [employer]    Authorization Time Period  60 visit limit, each discipline counts as 1 visit, need to check on visists used with PT at ortho clinic    Authorization - Visit Number  1    Authorization - Number of Visits  30    OT Start Time  1320    OT Stop Time  1400    OT Time Calculation (min)  40 min       Past Medical History:  Diagnosis Date  . Abnormal radiographic examination   . CHF (congestive heart failure) (HCC)   . Chronic kidney disease, stage III (moderate)   . Chronic kidney disease, stage III (moderate)   . CN (constipation)   . Essential hypertension, malignant   . HTN (hypertension)   . LVH (left ventricular hypertrophy)   . Protein-calorie malnutrition (HCC)     Past Surgical History:  Procedure Laterality Date  . BREAST EXCISIONAL BIOPSY Left     There were no vitals filed for this visit.  Subjective Assessment - 11/09/19 1324    Subjective   Pt reports intermittant double vision.    Pertinent History  Kendra Richardson is a 64 y.o. female systolic heart failure, CKD 3, hypertension, seen in ED 10/26/19 for evaluation of a syncopal episode. Pt fell and lost consciousness, sustained L eye contusion. 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  PMH: HTN CKD Stage III, CHF, left forzen shoulder per pt report. PTA, pt lived alone and was  independent, including driving.    Patient Stated Goals  to get back to taking care of her home and self    Currently in Pain?  Yes    Pain Score  3     Pain Location  Hip    Pain Orientation  Left    Pain Descriptors / Indicators  Aching    Pain Type  Acute pain    Pain Onset  1 to 4 weeks ago    Pain Frequency  Intermittent    Aggravating Factors   meds    Pain Relieving Factors  unknown        [facility] OT Assessment - 11/10/19 1343      Assessment   Medical Diagnosis  CVA, aneurysm    Referring Provider (OT)  Dr. Tenny Craw, PCP   Dr. Dina Rich- hospitalist   Onset Date/Surgical Date  10/26/19    Prior Therapy  PT for frozen shoulder      Precautions   Precautions  Fall      Balance Screen   Has the patient fallen in the past 6 months  Yes    How many times?  1    Has the patient had a decrease in activity level because of a fear of falling?   Yes    Is the patient reluctant to leave their home because of a fear of falling?  Yes      Home  Environment   Family/patient expects to be discharged to:  Private residence    Available Help at Discharge  Family    Home Access  Stairs    Home Layout  Two level    Bathroom Shower/Tub  Walk-in Shower;Tub/Shower unit    Lives With  Alone      Prior Function   Level of Independence  Independent    Vocation  Retired      ADL   Eating/Feeding  Modified independent    Grooming  Independent    Upper Body Bathing  Modified independent    Lower Body Bathing  Modified independent    Upper Body Dressing  Supervision/safety    Lower Body Dressing  Supervision/safety    Toilet Transfer  Modified independent    Tub/Shower Transfer  Supervision/safety   supervision recommended however, pt has performed mod I     IADL   Shopping  Completely unable to shop    Light Housekeeping  Performs light daily tasks but cannot maintain acceptable level of cleanliness   Pt's sister is assisting with cooking   Meal Prep  --   Pt is cooking with  asisstance   Medication Management  --   Pt is currently performing, pill minder recommended   Financial Management  --   writes checks and bills but pt has not been performing since     Mobility   Mobility Status  Independent      Written Expression   Dominant Hand  Right      Vision Assessment   Vision Assessment  Vision impaired  _ to be further tested in functional context    Ocular Range of Motion  Within Functional Limits    Tracking/Visual Pursuits  Able to track stimulus in all quads without difficulty    Diplopia Assessment  Objects split on top of one another   when laying on left side   Comment  ptosis left eye, difficulty keeping eye open at times      Cognition   Overall Cognitive Status  Cognition to be further assessed in functional context PRN    Attention  Sustained    Memory  --   per hospital report mild short term memory deficits   Cognition Comments  Pt was very drowsy thoughout eval and required increased time for response      Coordination   Fine Motor Movements are Fluid and Coordinated  Yes    9 Hole Peg Test  Right;Left    Right 9 Hole Peg Test  26.9 ses    Left 9 Hole Peg Test  31.69      ROM / Strength   AROM / PROM / Strength  AROM;Strength      AROM   Overall AROM   Deficits    Overall AROM Comments  RUE grossly WFLs, LUE shoulder flexion and abduction limited to 100*, pt reports frozen shoulder prior to CVA      Strength   Overall Strength  Deficits    Overall Strength Comments  RUE grossly 4+/5, LUE grossly 4-/5      Hand Function   Right Hand Grip (lbs)  43.6    Left Hand Grip (lbs)  43.8                        OT Short Term Goals - 11/10/19 1350      OT SHORT TERM GOAL #1  Title  I with inital HEP.    Time  6    Period  Weeks    Status  New    Target Date  12/25/19      OT SHORT TERM GOAL #2   Title  Pt will demonstrate 110 shoulder flexion in prep for functional reach    Baseline  100    Time  6     Period  Weeks    Status  New      OT SHORT TERM GOAL #3   Title  Pt will verbalize understanding of compensatory strategies for visual impairments.    Time  6    Period  Weeks    Status  New      OT SHORT TERM GOAL #4   Title  Pt will perfrom basic home managment/ cooking with supervision demonstrating good safety awareness.    Time  6    Period  Weeks    Status  New      OT SHORT TERM GOAL #5   Title  Further assess cognition in a functional context and set goals as needed.    Time  6    Period  Weeks    Status  New        OT Long Term Goals - 11/10/19 1352      OT LONG TERM GOAL #1   Title  I with updated HEP    Time  12    Period  Weeks    Status  New    Target Date  02/08/20      OT LONG TERM GOAL #2   Title  Pt will retrieve a lightweight object at 115 shoulder flexion with LUE.    Baseline  100    Time  12    Period  Weeks    Status  New      OT LONG TERM GOAL #3   Title  Pt will perform mod complex home managment/ cooking modified independently.    Time  12    Period  Weeks    Status  New      OT LONG TERM GOAL #4   Title  Pt will demonstrate ability to perform a physical and cogntive task simultaneously with 90% or better accuracy in prep for driving.    Time  12    Period  Weeks    Status  New      OT LONG TERM GOAL #5   Title  Pt will navigate a busy environment and locate itmes with 90% or better accuracy and no reports of double vison.    Time  12    Period  Weeks    Status  New      OT LONG TERM GOAL #6   Title  Pt will resume planting flowers modified independently.    Time  12    Period  Weeks    Status  New            Plan - 11/10/19 1336    Clinical Impression Statement  Kendra Richardson is a 64 y.o. female systolic heart failure, CKD 3, hypertension, seen in ED 10/26/19 for evaluation of a syncopal episode. Pt fell and lost consciousness, sustained L eye contusion. Imaging revealed 2 acute L frontal infarcts, CT  showed 3.5 mm aneurysm.Pt is seeing interventional radiology for possible procedure in the future.  PMH: HTN CKD Stage III, CHF, left forzen shoulder per pt report. PTA, pt lived alone  and was independent, including driving. Pt presents to occupational therapy with the following deficits: decreased strength, decreased LUE ROM, decreased balance, visual impairments, decreased activity tolerance, cognitive deficits which impede performance of ADLs/ IADLs. Pt can benefit from skilled occupational therapy to address these deficits in order to maximize pt's safety and I with ADLS/ IADLs.    OT Occupational Profile and History  Detailed Assessment- Review of Records and additional review of physical, cognitive, psychosocial history related to current functional performance    Occupational performance deficits (Please refer to evaluation for details):  ADL's;IADL's;Leisure;Social Participation    Body Structure / Function / Physical Skills  ADL;Endurance;UE functional use;Balance;Flexibility;Pain;FMC;ROM;Coordination;Decreased knowledge of precautions;Decreased knowledge of use of DME;IADL;Strength;Mobility    Cognitive Skills  Attention;Memory;Safety Awareness;Understand;Thought    Rehab Potential  Fair    Clinical Decision Making  Several treatment options, min-mod task modification necessary    Comorbidities Affecting Occupational Performance:  May have comorbidities impacting occupational performance    Modification or Assistance to Complete Evaluation   No modification of tasks or assist necessary to complete eval    OT Frequency  2x / week    OT Duration  12 weeks    OT Treatment/Interventions  Self-care/ADL training;Ultrasound;Energy conservation;Visual/perceptual remediation/compensation;Patient/family education;Aquatic Therapy;DME and/or AE instruction;Balance training;Passive range of motion;Paraffin;Cryotherapy;Fluidtherapy;Splinting;Functional Mobility Training;Electrical Stimulation;Moist  Heat;Therapeutic exercise;Manual Therapy;Therapeutic activities;Cognitive remediation/compensation;Neuromuscular education    Plan  further assess visual deficits and cogntion in a functional context, HEP for LUE ROM    Consulted and Agree with Plan of Care  Patient;Family member/caregiver    Family Member Consulted  sister       Patient will benefit from skilled therapeutic intervention in order to improve the following deficits and impairments:   Body Structure / Function / Physical Skills: ADL, Endurance, UE functional use, Balance, Flexibility, Pain, FMC, ROM, Coordination, Decreased knowledge of precautions, Decreased knowledge of use of DME, IADL, Strength, Mobility Cognitive Skills: Attention, Memory, Safety Awareness, Understand, Thought     Visit Diagnosis: Stiffness of left shoulder, not elsewhere classified - Plan: Ot plan of care cert/re-cert  Muscle weakness (generalized) - Plan: Ot plan of care cert/re-cert  Visuospatial deficit - Plan: Ot plan of care cert/re-cert  Other abnormalities of gait and mobility - Plan: Ot plan of care cert/re-cert  Unsteadiness on feet - Plan: Ot plan of care cert/re-cert  Attention and concentration deficit - Plan: Ot plan of care cert/re-cert    Problem List Patient Active Problem List   Diagnosis Date Noted  . Acute cerebrovascular accident (CVA) ([place]) 10/26/2019  . Chronic kidney disease, stage III (moderate)   . HFrEF (heart failure with reduced ejection fraction) ([place]) 08/05/2019  . Essential hypertension 07/07/2019  . Nonspecific abnormal electrocardiogram (ECG) (EKG) 07/07/2019    Kendra Richardson 11/10/2019, 3:09 PM  [facility] [address] [place], [place], [address] Phone: [phone]   Fax:  [phone]  Name: Kendra Richardson MRN: [id number] Date of Birth: 10-31-1955

## 2019-11-10 NOTE — Addendum Note (Signed)
Addended by: Ileana Ladd on: 11/10/2019 10:52 AM   Modules accepted: Orders

## 2019-11-11 ENCOUNTER — Ambulatory Visit (INDEPENDENT_AMBULATORY_CARE_PROVIDER_SITE_OTHER): Payer: No Typology Code available for payment source | Admitting: Pharmacist

## 2019-11-11 ENCOUNTER — Other Ambulatory Visit: Payer: Self-pay

## 2019-11-11 VITALS — BP 122/80 | HR 74

## 2019-11-11 DIAGNOSIS — I5020 Unspecified systolic (congestive) heart failure: Secondary | ICD-10-CM

## 2019-11-11 NOTE — Progress Notes (Signed)
Patient ID: Kendra Richardson                 DOB: 07/24/1955                      MRN: [id number]     HPI: Kendra Richardson is a 64 y.o. female referred by Dr. Marlou Porch to HTN clinic. PMH is significant for uncontrolled HTN, CHF (EF 30-35%), CKD stage 3, left ventricular hypertrophy, and bigeminal PVCs. She has been followed closely in the PharmD clinic for some time now optimizing her heart failure medications. Patient was recently switched from clonidine patch to BiDil. Unfortunately during the transition she had a syncope event. 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   Patient presents today accompanied by her sister. She has since seen IR after discharge who is not confident she had a stroke, but wants to do a procedure to remove aneurysm and possibly stent. She will be going to PT and OT as well. Still a little whoozy, but feeling better. Does not have a headache today. Has measured her blood pressure at home. Last one was 134/94. Previous 101/68.    Current HTN/HF meds: amlodipine 10 mg daily, Entresto 97/103 mg twice a day, carvedilol 25mg  twice daily Previously tried: HCTZ (lightheaded), spironolactone (increase in scr), Bystolic (switched to carvedilol for HF benefit) BP goal: < 130/80 mmHg  Family History: Father and sister- heart disease and HTN. Mother- DM, HTN and breast cancer. Youngest brother and sister- HTN. Aunt- lung cancer. Another aunt- HTN. She states HTN is common on both sides of the family.  Social History: Stopped cigarette smoking a few months ago. One 8 oz beer once every 3-4 weeks. Previous drug use.  Diet: Eats a lot of vegetables, fruits and fiber in diet. Fruits: apples, grapes, oranges, and berries. Likes to eat fruits with ice cream. Does not cook with salt or add salt. Raisin Bran or Cheerios w/ an apple for breakfast. Salads w/ chicken or fish. Ginger ale twice a week. Drinks Welch's grape juice w/ fiber, green  tea, and plenty of water.  Exercise: walking for 10-52min every day,  physical therapy on tues and thursdays  Home BP readings: 128/79 HR 66, 132/89 HR 60, 113/75 HR 67, 121/78 HR 65, 134/84 (physical therapy-pain) 129/85 HR 63  Wt Readings from Last 3 Encounters:  10/27/19 119 lb 11.4 oz (54.3 kg)  07/07/19 102 lb 6.4 oz (46.4 kg)   BP Readings from Last 3 Encounters:  10/28/19 (!) 154/88  09/02/19 (!) 150/100  08/19/19 (!) 134/96   Pulse Readings from Last 3 Encounters:  10/28/19 71  09/02/19 67  08/19/19 (!) 58    Renal function: CrCl cannot be calculated (Unknown ideal weight.).  Past Medical History:  Diagnosis Date  . Abnormal radiographic examination   . CHF (congestive heart failure) ([place])   . Chronic kidney disease, stage III (moderate)   . Chronic kidney disease, stage III (moderate)   . CN (constipation)   . Essential hypertension, malignant   . HTN (hypertension)   . LVH (left ventricular hypertrophy)   . Protein-calorie malnutrition ([place])     Current Outpatient Medications on File Prior to Visit  Medication Sig Dispense Refill  . amLODipine (NORVASC) 10 MG tablet Take 10 mg by mouth daily.    Marland Kitchen aspirin EC 81 MG tablet Take 1 tablet (81 mg total) by mouth daily. 30 tablet 2  . atorvastatin (  LIPITOR) 40 MG tablet Take 1 tablet (40 mg total) by mouth daily. 30 tablet 2  . carvedilol (COREG) 25 MG tablet Take 1 tablet (25 mg total) by mouth 2 (two) times daily. 180 tablet 3  . clopidogrel (PLAVIX) 75 MG tablet Take 1 tablet (75 mg total) by mouth daily. 30 tablet 2  . Multiple Vitamin (MULTIVITAMIN) tablet Take 1 tablet by mouth daily.    . sacubitril-valsartan (ENTRESTO) 97-103 MG Take 1 tablet by mouth 2 (two) times daily. 180 tablet 3   No current facility-administered medications on file prior to visit.    No Known Allergies   Assessment/Plan:  1. Hypertension/HF optimization -Continue current medications,amlodipine 10 mg daily, Entresto 97/103 mg  twice a day, carvedilol 25mg  twice daily. I have asked her to continue to check her blood pressure once a day at home and keep a log. She will call me in 1-2 weeks with #'s. I have also asked her to check with neurology and IR to see what blood pressure goal they would like (ie is she still on permissive HTN or not).   , Pharm.D, BCPS, CPP [facility]  [address] [place], [place]  Phone: [phone]; Fax: [phone]

## 2019-11-11 NOTE — Patient Instructions (Signed)
It was so nice to see you again!  Please check your blood pressure once a day and keep a log.  Call me in 1-2 weeks with your blood pressure readings.  Please ask the neurologist and IR Dr. What they would like your blood pressure to be.  As always, call me at [phone] with any questions.

## 2019-11-12 ENCOUNTER — Encounter: Payer: Self-pay | Admitting: Occupational Therapy

## 2019-11-12 ENCOUNTER — Ambulatory Visit: Payer: No Typology Code available for payment source

## 2019-11-12 ENCOUNTER — Ambulatory Visit: Payer: No Typology Code available for payment source | Admitting: Occupational Therapy

## 2019-11-12 DIAGNOSIS — R2689 Other abnormalities of gait and mobility: Secondary | ICD-10-CM | POA: Diagnosis not present

## 2019-11-12 DIAGNOSIS — M6281 Muscle weakness (generalized): Secondary | ICD-10-CM

## 2019-11-12 DIAGNOSIS — M25612 Stiffness of left shoulder, not elsewhere classified: Secondary | ICD-10-CM

## 2019-11-12 DIAGNOSIS — R41842 Visuospatial deficit: Secondary | ICD-10-CM

## 2019-11-12 DIAGNOSIS — R2681 Unsteadiness on feet: Secondary | ICD-10-CM

## 2019-11-12 NOTE — Therapy (Signed)
[facility] [address] [place], [place], [address] Phone: [phone]   Fax:  [phone]  Occupational Therapy Treatment  Patient Details  Name: Kendra Richardson MRN: [id number] Date of Birth: [birth year] Referring Provider (OT): Dr. Tenny Craw, PCP (Dr. Dina Rich- hospitalist)   Encounter Date: 11/12/2019  OT End of Session - 11/12/19 1220    Visit Number  2    Number of Visits  25    Date for OT Re-Evaluation  02/08/20    Authorization Type  [employer]    Authorization Time Period  60 visit limit, each discipline counts as 1 visit, need to check on visits used with PT at ortho clinic- pt to bring in, may need to decrease OT frequency dependent on visits used    Authorization - Visit Number  1    Authorization - Number of Visits  30    OT Start Time  1150    OT Stop Time  1230    OT Time Calculation (min)  40 min       Past Medical History:  Diagnosis Date  . Abnormal radiographic examination   . CHF (congestive heart failure) (H[facility])   . Chronic kidney disease, stage III (moderate)   . Chronic kidney disease, stage III (moderate)   . CN (constipation)   . Essential hypertension, malignant   . HTN (hypertension)   . LVH (left ventricular hypertrophy)   . Protein-calorie malnutrition ([facility])     Past Surgical History:  Procedure Laterality Date  . BREAST EXCISIONAL BIOPSY Left     There were no vitals filed for this visit.  Subjective Assessment - 11/12/19 1252    Subjective   pt reports she is feeling better today    Pertinent History  Kendra Richardson is a 64 y.o. female systolic heart failure, CKD 3, hypertension, seen in ED 10/26/19 for evaluation of a syncopal episode. Pt fell and lost consciousness, sustained L eye contusion. 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  PMH: HTN CKD Stage III,  CHF, left forzen shoulder per pt report. PTA, pt lived alone and was independent, including driving.    Patient Stated Goals  to get back to taking care of her home and self    Currently in Pain?  Yes    Pain Score  4     Pain Location  Shoulder    Pain Orientation  Left    Pain Descriptors / Indicators  Aching    Pain Type  Chronic pain    Pain Onset  1 to 4 weeks ago    Pain Frequency  Intermittent    Aggravating Factors   malpositioning, ROM    Pain Relieving Factors  repositioning                 Treatment: Supine, joint and scapular mobs then supine closed chain, chest press shoulder flexion and internal/ external rotation, with therapist facilitating shoulder and scapular position, Butterfly stretch in supine, within tolerated ROM, pt was unable to fully abduct LUE. Standing UE ranger for shoulder flexion and horizontal adduction across body with pt stepping, min facilitation, v.c  Tabletop scanning number cancellation 1.5 M only 1 error out of 80. Environmental scanning 1/13 missed on first pass. Pt is much more alert and participatory today.            OT Short Term Goals - 11/10/19 1350  OT SHORT TERM GOAL #1   Title  I with inital HEP.    Time  6    Period  Weeks    Status  New    Target Date  12/25/19      OT SHORT TERM GOAL #2   Title  Pt will demonstrate 110 shoulder flexion in prep for functional reach    Baseline  100    Time  6    Period  Weeks    Status  New      OT SHORT TERM GOAL #3   Title  Pt will verbalize understanding of compensatory strategies for visual impairments.    Time  6    Period  Weeks    Status  New      OT SHORT TERM GOAL #4   Title  Pt will perfrom basic home managment/ cooking with supervision demonstrating good safety awareness.    Time  6    Period  Weeks    Status  New      OT SHORT TERM GOAL #5   Title  Further assess cognition in a functional context and set goals as needed.    Time  6    Period   Weeks    Status  New        OT Long Term Goals - 11/10/19 1352      OT LONG TERM GOAL #1   Title  I with updated HEP    Time  12    Period  Weeks    Status  New    Target Date  02/08/20      OT LONG TERM GOAL #2   Title  Pt will retrieve a lightweight object at 115 shoulder flexion with LUE.    Baseline  100    Time  12    Period  Weeks    Status  New      OT LONG TERM GOAL #3   Title  Pt will perform mod complex home managment/ cooking modified independently.    Time  12    Period  Weeks    Status  New      OT LONG TERM GOAL #4   Title  Pt will demonstrate ability to perform a physical and cogntive task simultaneously with 90% or better accuracy in prep for driving.    Time  12    Period  Weeks    Status  New      OT LONG TERM GOAL #5   Title  Pt will navigate a busy environment and locate itmes with 90% or better accuracy and no reports of double vison.    Time  12    Period  Weeks    Status  New      OT LONG TERM GOAL #6   Title  Pt will resume planting flowers modified independently.    Time  12    Period  Weeks    Status  New            Plan - 11/12/19 1221    Clinical Impression Statement  Pt is progressing towards goals. Pt was much more alert today and less drowsy. Pt will bring in her exercises from the orthopedic clinic in order to update HEP. Pt was being treated for frozen shoulder prior to hospitalization.    OT Occupational Profile and History  Detailed Assessment- Review of Records and additional review of physical, cognitive, psychosocial history related to current functional performance  Occupational performance deficits (Please refer to evaluation for details):  ADL's;IADL's;Leisure;Social Participation    Body Structure / Function / Physical Skills  ADL;Endurance;UE functional use;Balance;Flexibility;Pain;FMC;ROM;Coordination;Decreased knowledge of precautions;Decreased knowledge of use of DME;IADL;Strength;Mobility    Cognitive Skills   Attention;Memory;Safety Awareness;Understand;Thought    Rehab Potential  Fair    Clinical Decision Making  Several treatment options, min-mod task modification necessary    Comorbidities Affecting Occupational Performance:  May have comorbidities impacting occupational performance    Modification or Assistance to Complete Evaluation   No modification of tasks or assist necessary to complete eval    OT Frequency  2x / week    OT Duration  12 weeks    OT Treatment/Interventions  Self-care/ADL training;Ultrasound;Energy conservation;Visual/perceptual remediation/compensation;Patient/family education;Aquatic Therapy;DME and/or AE instruction;Balance training;Passive range of motion;Paraffin;Cryotherapy;Fluidtherapy;Splinting;Functional Mobility Training;Electrical Stimulation;Moist Heat;Therapeutic exercise;Manual Therapy;Therapeutic activities;Cognitive remediation/compensation;Neuromuscular education    Plan  Update HEP prn, pt to bring in exercises from ortho    Consulted and Agree with Plan of Care  Patient    Family Member Consulted  --       Patient will benefit from skilled therapeutic intervention in order to improve the following deficits and impairments:   Body Structure / Function / Physical Skills: ADL, Endurance, UE functional use, Balance, Flexibility, Pain, FMC, ROM, Coordination, Decreased knowledge of precautions, Decreased knowledge of use of DME, IADL, Strength, Mobility Cognitive Skills: Attention, Memory, Safety Awareness, Understand, Thought     Visit Diagnosis: Muscle weakness (generalized)  Visuospatial deficit  Stiffness of left shoulder, not elsewhere classified    Problem List Patient Active Problem List   Diagnosis Date Noted  . Acute cerebrovascular accident (CVA) (HCC) 10/26/2019  . Chronic kidney disease, stage III (moderate)   . HFrEF (heart failure with reduced ejection fraction) (HCC) 08/05/2019  . Essential hypertension 07/07/2019  . Nonspecific  abnormal electrocardiogram (ECG) (EKG) 07/07/2019    Kendra Richardson 11/12/2019, 12:53 PM  [facility] [address] [place], [place], [address] Phone: [phone]   Fax:  [phone]  Name: Kendra Richardson MRN: [id number] Date of Birth: Aug 25, 1955

## 2019-11-12 NOTE — Therapy (Signed)
[facility] [address] [place], [place], [address] Phone: [phone]   Fax:  [phone]  Physical Therapy Treatment  Patient Details  Name: Kendra Richardson MRN: [id number] Date of Birth: 1956-05-11 No data recorded  Encounter Date: 11/12/2019  PT End of Session - 11/12/19 1308    Visit Number  2    Number of Visits  9    Date for PT Re-Evaluation  01/04/20    PT Start Time  1230    PT Stop Time  1315    PT Time Calculation (min)  45 min    Equipment Utilized During Treatment  Gait belt    Activity Tolerance  Patient tolerated treatment well    Behavior During Therapy  [facility] for tasks assessed/performed       Past Medical History:  Diagnosis Date  . Abnormal radiographic examination   . CHF (congestive heart failure) (HCC)   . Chronic kidney disease, stage III (moderate)   . Chronic kidney disease, stage III (moderate)   . CN (constipation)   . Essential hypertension, malignant   . HTN (hypertension)   . LVH (left ventricular hypertrophy)   . Protein-calorie malnutrition ([facility])     Past Surgical History:  Procedure Laterality Date  . BREAST EXCISIONAL BIOPSY Left     There were no vitals filed for this visit.  Subjective Assessment - 11/12/19 1239    Subjective  Pt reports MD has stopped her Lipitor and has put her on some new pills that she takes Monday, Wednesday and Friday. Her pain is completely gone from her hips and she is not getting headaches.               treatment: Walking with 10lb weight in R hand: 220 feet, L hand: 220 feet Gaze stabilization: pt stands with narrow BOS: turns head quickly to left, right, up, down based on PT's direction and tries to focus her gaze on "E" on wall. Pt reported E was blurry when she looked down with both eyes open but E was clear if she closed one eye (R or L). When practiced looking up and down with one eye closed, she could focus on E  quickly and with clarity. E stayed in focus with R and L head turns.  Stair training: 4 steps with 1 rail use; 12 steps without any rail, cues to not move her head up and down quickly but moving it slowly when she is at top or bottom of the step.  Convergence: slowly moved the letter closer to her eye and asked patient to try to focus as it became blurry. Then moved it little closer and asked pt to try focusing on it again- pt became nauseated from this and needed to sit down. Attempted for 2nd try and patient got nauseated quickly and we had to sit down until it cleared.                   PT Short Term Goals - 11/12/19 1231      PT SHORT TERM GOAL #1   Title  Patient will be able to ambulate 20 min without AD and without significant gait deviations or SOB to improve walking endurance.    Baseline  5 min    Time  4    Period  Weeks    Status  New    Target Date  12/07/19      PT SHORT TERM GOAL #2  Title  Pt will be able to carry 10-15 lb weight in hand to be able to carry grocery bags from car to house, about 200 feet    Baseline  sister helping with groceries    Time  4    Period  Weeks    Status  New    Target Date  12/07/19        PT Long Term Goals - 11/12/19 1231      PT LONG TERM GOAL #1   Title  Patient will be able to carry a laundry basket up stairs to her room on the stairs I to return to PLOF    Baseline  Sister is helping out with ADLs.    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 Pt was able to go up and down stairs without HHA..    Personal Factors and Comorbidities  Comorbidity 2    Comorbidities  HTN, CKD stage III    Examination-Activity Limitations  Caring for Others;Carry;Squat    Examination-Participation Restrictions  Cleaning;Community Activity;Driving;Laundry;Shop;Yard Work;Meal Prep    Stability/Clinical Decision Making  Evolving/Moderate complexity    Rehab Potential  Good    PT Frequency  1x / week    PT Duration  8 weeks    PT Treatment/Interventions  ADLs/Self Care Home Management;Gait training;Stair training;Functional mobility training;Therapeutic activities;Therapeutic exercise;Balance training;Neuromuscular re-education;Patient/family education    PT Next Visit Plan  6 MWT, add hamstring stretching, review piriformis stretching for pain relief    PT Home Exercise Plan  Piriformis stretch; walking program (goal 30 min /day in 5 weeks)    Consulted and Agree with Plan of Care  Patient;Family member/caregiver    Family Member Consulted  sister (patricia)       Patient will benefit from skilled therapeutic intervention in order to improve the following deficits and impairments:  Abnormal gait, Decreased activity tolerance, Decreased endurance, Decreased mobility, Difficulty walking, Pain  Visit Diagnosis: Muscle weakness (generalized)  Other abnormalities of gait and mobility  Unsteadiness on feet     Problem List Patient Active Problem List   Diagnosis Date Noted  . Acute cerebrovascular accident (CVA) ([place]) 10/26/2019  . Chronic kidney disease, stage III (moderate)   . HFrEF (heart failure with reduced ejection fraction) ([place]) 08/05/2019  . Essential hypertension 07/07/2019  . Nonspecific abnormal electrocardiogram (ECG) (EKG) 07/07/2019    Kerrie Pleasure 11/12/2019, 1:13 PM  [facility] [address] [place], [place], [address] Phone: [phone]   Fax:  [phone]  Name: Kendra Richardson MRN: [id number] Date  of Birth: [birth year]

## 2019-11-16 ENCOUNTER — Other Ambulatory Visit: Payer: Self-pay | Admitting: *Deleted

## 2019-11-16 NOTE — Patient Outreach (Signed)
[facility]) Care Management  11/16/2019  Kendra Richardson 1955-07-14 [id number]   Subjective: Telephone call to patient's home number, spoke with patient, and HIPAA verified. Patient states she is doing a little better and remembers speaking with this RNCM in the past.  States she had a follow up appointment with primary MD on 11/02/2019 and appointment went.   States she continues to receive outpatient therapies and they are going well, next appointment on 11/17/2019.   States she had follow up appointment with radiologist on 11/05/2019, she has a developing aneurysm, radiologist is planning to do a procedure, and is she waiting for call back today regarding when the procedure will be scheduled.  Patient states she does not have any education material, EMMI follow up, care coordination, care management, disease monitoring, transportation, community resource, or pharmacy needs at this time.  States she is very appreciative of the follow up, is in agreement  to receive 1 additional follow up call to assess for further CM needs, and is in agreement to receive [facility] Care Management EMMI follow up calls as needed.    Objective: Per KPN (Knowledge Performance Now, point of care tool) and chart review, patient hospitalized 10/26/2019 - 10/28/2019 for acute CVA, Acute thrombotic stroke, left ACA territory infarction.  Patient also has a history of chronic systolic heart failure, hypertension, and CKD stage IIIa.    Assessment:  Received Medicare EMMI Stroke Red Flag Alert follow up referral on 11/01/2019.  Red Flag Alert Triggers, Day #1, times 2, patient answered no to the following question: Scheduled a follow-up appointment?  Patient answered yes to the following question: Problems setting up rehab?    EMMI follow up completed and will follow up to assess further care management needs.      Plan: RNCM will call patient for telephone outreach attempt, within 14 business  days, EMMI follow up, to assess for further CM needs, and proceed with case closure, within 10 business days if no return call, after 4th unsuccessful outreach call.    [name] H. Gardiner Barefoot, BSN, CCM [facility] Care Management [facility] Telephonic CM Phone: [phone] Fax: [phone]

## 2019-11-17 ENCOUNTER — Ambulatory Visit: Payer: No Typology Code available for payment source | Admitting: Occupational Therapy

## 2019-11-17 ENCOUNTER — Telehealth: Payer: Self-pay

## 2019-11-17 ENCOUNTER — Encounter: Payer: Self-pay | Admitting: Occupational Therapy

## 2019-11-17 ENCOUNTER — Other Ambulatory Visit: Payer: Self-pay

## 2019-11-17 DIAGNOSIS — R2689 Other abnormalities of gait and mobility: Secondary | ICD-10-CM | POA: Diagnosis not present

## 2019-11-17 DIAGNOSIS — R2681 Unsteadiness on feet: Secondary | ICD-10-CM

## 2019-11-17 DIAGNOSIS — R41842 Visuospatial deficit: Secondary | ICD-10-CM

## 2019-11-17 DIAGNOSIS — M25612 Stiffness of left shoulder, not elsewhere classified: Secondary | ICD-10-CM

## 2019-11-17 DIAGNOSIS — R41840 Attention and concentration deficit: Secondary | ICD-10-CM

## 2019-11-17 DIAGNOSIS — M6281 Muscle weakness (generalized): Secondary | ICD-10-CM

## 2019-11-17 NOTE — Telephone Encounter (Signed)
Critical Cardiac Report received 5/25 7:14 CST Sinus Rhythm with 1st Degree AV Block  10 beat run of Vtach  Pt reports that she was asymptomatic and has been feeling normal. She was watching T.V. during the time of the event last evening.  Given to DOD, Dr. Johney Frame for review who advised to continue monitoring.

## 2019-11-17 NOTE — Therapy (Signed)
[facility] [address] [place], [place], [address] Phone: [phone]   Fax:  [phone]  Occupational Therapy Treatment  Patient Details  Name: Kendra Richardson MRN: [id number] Date of Birth: 1955/08/05 Referring Provider (OT): Dr. Tenny Craw, PCP (Dr. Dina Rich- hospitalist)   Encounter Date: 11/17/2019  OT End of Session - 11/17/19 1013    Visit Number  3    Number of Visits  25    Date for OT Re-Evaluation  02/08/20    Authorization Type  [employer]    Authorization Time Period  60 visit limit, each discipline counts as 1 visit, need to check on visits used with PT at ortho clinic- pt to bring in, may need to decrease OT frequency dependent on visits used    Authorization - Visit Number  2    Authorization - Number of Visits  30    OT Start Time  1017    OT Stop Time  1100    OT Time Calculation (min)  43 min    Activity Tolerance  Patient tolerated treatment well    Behavior During Therapy  [facility] for tasks assessed/performed       Past Medical History:  Diagnosis Date  . Abnormal radiographic examination   . CHF (congestive heart failure) (HCC)   . Chronic kidney disease, stage III (moderate)   . Chronic kidney disease, stage III (moderate)   . CN (constipation)   . Essential hypertension, malignant   . HTN (hypertension)   . LVH (left ventricular hypertrophy)   . Protein-calorie malnutrition (HCC)     Past Surgical History:  Procedure Laterality Date  . BREAST EXCISIONAL BIOPSY Left     There were no vitals filed for this visit.  Subjective Assessment - 11/17/19 1228    Subjective   Pt reports she is feeling better overall.  Pt reports feeling nervous about possible procedure for aneurysm, concerns about blood pressure (pt reports in normal ranges 120/80, which is different for her) and upcoming neurologist appt.    Pertinent History  Kendra Richardson is a 64 y.o. female systolic heart  failure, CKD 3, hypertension, seen in ED 10/26/19 for evaluation of a syncopal episode. Pt fell and lost consciousness, sustained L eye contusion. 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  PMH: HTN CKD Stage III, CHF, left forzen shoulder per pt report. PTA, pt lived alone and was independent, including driving.    Patient Stated Goals  to get back to taking care of her home and self    Currently in Pain?  Yes    Pain Score  5     Pain Location  Shoulder    Pain Orientation  Left    Pain Descriptors / Indicators  Aching    Pain Type  Chronic pain    Pain Onset  1 to 4 weeks ago    Pain Frequency  Intermittent    Aggravating Factors   malpositioning    Pain Relieving Factors  stretching       Sidelying, AAROM shoulder flex with therapist providing facilitation for incr scapular mobility with no pain.  Pt reports needing 2 pillows in supine due to dizziness, no dizziness with sidelying.         OT Education - 11/17/19 1228    Education Details  HEP--see pt instructions    Person(s) Educated  Patient    Methods  Explanation;Demonstration;Handout;Verbal cues;Tactile cues    Comprehension  Verbalized understanding;Returned demonstration       OT Short Term Goals - 11/10/19 1350      OT SHORT TERM GOAL #1   Title  I with inital HEP.    Time  6    Period  Weeks    Status  New    Target Date  12/25/19      OT SHORT TERM GOAL #2   Title  Pt will demonstrate 110 shoulder flexion in prep for functional reach    Baseline  100    Time  6    Period  Weeks    Status  New      OT SHORT TERM GOAL #3   Title  Pt will verbalize understanding of compensatory strategies for visual impairments.    Time  6    Period  Weeks    Status  New      OT SHORT TERM GOAL #4   Title  Pt will perfrom basic home managment/ cooking with supervision demonstrating good safety awareness.    Time  6     Period  Weeks    Status  New      OT SHORT TERM GOAL #5   Title  Further assess cognition in a functional context and set goals as needed.    Time  6    Period  Weeks    Status  New        OT Long Term Goals - 11/10/19 1352      OT LONG TERM GOAL #1   Title  I with updated HEP    Time  12    Period  Weeks    Status  New    Target Date  02/08/20      OT LONG TERM GOAL #2   Title  Pt will retrieve a lightweight object at 115 shoulder flexion with LUE.    Baseline  100    Time  12    Period  Weeks    Status  New      OT LONG TERM GOAL #3   Title  Pt will perform mod complex home managment/ cooking modified independently.    Time  12    Period  Weeks    Status  New      OT LONG TERM GOAL #4   Title  Pt will demonstrate ability to perform a physical and cogntive task simultaneously with 90% or better accuracy in prep for driving.    Time  12    Period  Weeks    Status  New      OT LONG TERM GOAL #5   Title  Pt will navigate a busy environment and locate itmes with 90% or better accuracy and no reports of double vison.    Time  12    Period  Weeks    Status  New      OT LONG TERM GOAL #6   Title  Pt will resume planting flowers modified independently.    Time  12    Period  Weeks    Status  New            Plan - 11/17/19 1014    Clinical Impression Statement  Pt is progressing towards goals.  Pt demo good understanding of updated HEP after instruction. (pt able to verbally describe/demo ortho HEP aswas being treated for frozen shoulder prior to hospitalization, but stated that she could not  find handouts.)    OT Occupational Profile and History  Detailed Assessment- Review of Records and additional review of physical, cognitive, psychosocial history related to current functional performance    Occupational performance deficits (Please refer to evaluation for details):  ADL's;IADL's;Leisure;Social Participation    Body Structure / Function / Physical Skills   ADL;Endurance;UE functional use;Balance;Flexibility;Pain;FMC;ROM;Coordination;Decreased knowledge of precautions;Decreased knowledge of use of DME;IADL;Strength;Mobility    Cognitive Skills  Attention;Memory;Safety Awareness;Understand;Thought    Rehab Potential  Fair    Clinical Decision Making  Several treatment options, min-mod task modification necessary    Comorbidities Affecting Occupational Performance:  May have comorbidities impacting occupational performance    Modification or Assistance to Complete Evaluation   No modification of tasks or assist necessary to complete eval    OT Frequency  2x / week    OT Duration  12 weeks    OT Treatment/Interventions  Self-care/ADL training;Ultrasound;Energy conservation;Visual/perceptual remediation/compensation;Patient/family education;Aquatic Therapy;DME and/or AE instruction;Balance training;Passive range of motion;Paraffin;Cryotherapy;Fluidtherapy;Splinting;Functional Mobility Training;Electrical Stimulation;Moist Heat;Therapeutic exercise;Manual Therapy;Therapeutic activities;Cognitive remediation/compensation;Neuromuscular education    Plan  review HEP, continue with neuro re-ed L shoulder, visual compensation strategies    Consulted and Agree with Plan of Care  Patient       Patient will benefit from skilled therapeutic intervention in order to improve the following deficits and impairments:   Body Structure / Function / Physical Skills: ADL, Endurance, UE functional use, Balance, Flexibility, Pain, FMC, ROM, Coordination, Decreased knowledge of precautions, Decreased knowledge of use of DME, IADL, Strength, Mobility Cognitive Skills: Attention, Memory, Safety Awareness, Understand, Thought     Visit Diagnosis: Muscle weakness (generalized)  Visuospatial deficit  Stiffness of left shoulder, not elsewhere classified  Other abnormalities of gait and mobility  Unsteadiness on feet  Attention and concentration deficit    Problem  List Patient Active Problem List   Diagnosis Date Noted  . Acute cerebrovascular accident (CVA) (HCC) 10/26/2019  . Chronic kidney disease, stage III (moderate)   . HFrEF (heart failure with reduced ejection fraction) (HCC) 08/05/2019  . Essential hypertension 07/07/2019  . Nonspecific abnormal electrocardiogram (ECG) (EKG) 07/07/2019    [facility] 11/17/2019, 12:35 PM  [facility] [address] [place], [place], [address] Phone: [phone]   Fax:  [phone]  Name: Kendra Richardson MRN: [id number] Date of Birth: 08/10/1955   Willa Frater, OTR/L [facility] [address] [place], [place]  [address] [phone] phone [phone] 11/17/19 12:35 PM

## 2019-11-17 NOTE — Patient Instructions (Addendum)
    Upper Extremity: [employer] down.  Hold a ball/shoe box/pillow (shoulder width sized)  with arms straight and hands flat on either side. Start with ball on chest, then straighten elbows to push ball up to the ceiling trying to keep elbows next to body hold 3sec, then slowly lower by keeping elbows close to your side. Keep shoulders away from ears Repeat 10 times.  Rest, then do 10 more, 2x/day   Overhead Arm Extension - Supine     Lay down.  Hold a ball/shoe box/pillow (shoulder width sized) on tops of legs with arms straight and hands flat on either side. Slowly move arms up/back in an arc with elbows straight and then slowly lower back down. Keep shoulders away from ears. Repeat 10 times.    Rest, then do 10 more, 2x/day     ROM: Abduction - Wand   Holding wand with left hand palm up, push wand directly out to side, leading with other hand palm down, until stretch is felt. Hold 5 seconds. Repeat 10 times per set, 2 sets. Do 2 sessions per day. (Lying down)   ROM: Extension - Wand (Standing)   Stand holding wand behind back. Raise arms as far as possible. THUMBS FACING FORWARD Repeat 10 times per set.  Do 2-3 sessions per day.  Active Assistive Shoulder External Rotation    SIT with stick at waist level, LEFT THUMB up, other palm down. push forearm out from body, and keep elbows bent AND BY YOUR SIDE. Perform 10 reps, 2 SETS. 2X DAY

## 2019-11-18 NOTE — Telephone Encounter (Signed)
Thanks. Agree Donato Schultz, MD

## 2019-11-19 ENCOUNTER — Ambulatory Visit: Payer: No Typology Code available for payment source

## 2019-11-19 ENCOUNTER — Other Ambulatory Visit: Payer: Self-pay

## 2019-11-19 ENCOUNTER — Encounter: Payer: Self-pay | Admitting: Occupational Therapy

## 2019-11-19 ENCOUNTER — Ambulatory Visit: Payer: No Typology Code available for payment source | Admitting: Occupational Therapy

## 2019-11-19 DIAGNOSIS — R2689 Other abnormalities of gait and mobility: Secondary | ICD-10-CM | POA: Diagnosis not present

## 2019-11-19 DIAGNOSIS — R41842 Visuospatial deficit: Secondary | ICD-10-CM

## 2019-11-19 DIAGNOSIS — R41840 Attention and concentration deficit: Secondary | ICD-10-CM

## 2019-11-19 DIAGNOSIS — I639 Cerebral infarction, unspecified: Secondary | ICD-10-CM

## 2019-11-19 DIAGNOSIS — R2681 Unsteadiness on feet: Secondary | ICD-10-CM

## 2019-11-19 DIAGNOSIS — M6281 Muscle weakness (generalized): Secondary | ICD-10-CM

## 2019-11-19 DIAGNOSIS — M25612 Stiffness of left shoulder, not elsewhere classified: Secondary | ICD-10-CM

## 2019-11-19 NOTE — Therapy (Signed)
[facility] [address] [place], [place], [address] Phone: [phone]   Fax:  [phone]  Physical Therapy Treatment  Patient Details  Name: Kendra Richardson MRN: [id number] Date of Birth: 01/29/1956 No data recorded  Encounter Date: 11/19/2019  PT End of Session - 11/19/19 1016    Visit Number  3    Number of Visits  9    Date for PT Re-Evaluation  01/04/20    PT Start Time  0930    PT Stop Time  1015    PT Time Calculation (min)  45 min    Equipment Utilized During Treatment  Gait belt    Activity Tolerance  Patient tolerated treatment well       Past Medical History:  Diagnosis Date  . Abnormal radiographic examination   . CHF (congestive heart failure) ([place])   . Chronic kidney disease, stage III (moderate)   . Chronic kidney disease, stage III (moderate)   . CN (constipation)   . Essential hypertension, malignant   . HTN (hypertension)   . LVH (left ventricular hypertrophy)   . Protein-calorie malnutrition ([place])     Past Surgical History:  Procedure Laterality Date  . BREAST EXCISIONAL BIOPSY Left     There were no vitals filed for this visit.  Subjective Assessment - 11/19/19 0947    Subjective  Pt reports she is doing good. She is doing her exercises and carrying grocery bags up the stairs. She cannot do the convergence as it keeps making nauseated.    Pertinent History  : HTN, CKD stage III    Limitations  Walking    How long can you sit comfortably?  no issues    How long can you stand comfortably?  10 min    How long can you walk comfortably?  5 min    Diagnostic tests  10/26/19: MRI of brain: IMPRESSION:1. 2 small areas of acute infarct in the left frontal lobe.2. Moderate chronic microvascular ischemic change. Scattered areasof chronic microhemorrhage in the brain compatible with chronichypertension. . Negative CTA for large vessel occlusion.2. 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 Notable findings include severe left P1 and bilateral P2stenoses, with moderate stenoses about the carotid siphonsbilaterally. Extensive small vessel atheromatous irregularity, mostpronounced about the left MCA and bilateral PCA distributions.3. 5 mm left PCOM aneurysm.    Patient Stated Goals  Walk better    Currently in Pain?  No/denies    Pain Onset  1 to 4 weeks ago           Treatment: Romberg on floor: EO: 30 sec, EC: 30 sec Sharpened romberg: EO: 30 sec, EC: 30 sec Standing lunges to Airex: 10x R and L Resisted walking fwd, bwd, lateral: 5x each, walking to left was most challanging. Single leg stance with hip flexion, hip abduction, hip extension: 2 x 10 R and L on black foam                       PT Short Term Goals - 11/12/19 1231      PT SHORT TERM GOAL #1   Title  Patient will be able to ambulate 20 min without AD and without significant gait deviations or SOB to improve walking endurance.    Baseline  5 min    Time  4    Period  Weeks    Status  New    Target Date  12/07/19  PT SHORT TERM GOAL #2   Title  Pt will be able to carry 10-15 lb weight in hand to be able to carry grocery bags from car to house, about 200 feet    Baseline  sister helping with groceries    Time  4    Period  Weeks    Status  New    Target Date  12/07/19        PT Long Term Goals - 11/12/19 1231      PT LONG TERM GOAL #1   Title  Patient will be able to carry a laundry basket up stairs to her room on the stairs I to return to PLOF    Baseline  Sister is helping out with ADLs.    Time  8    Period  Weeks    Status  New      PT LONG TERM GOAL #2   Title  Pt will be able to ambulate for 30 min independently without any significant gait deviations to return to PLOF    Baseline  5 min    Time  8    Period  Weeks    Status  New      PT LONG TERM GOAL #3   Title  Pt will be I and compliant with HEP to self manage her  symptoms    Baseline  Not perfomring exercises    Time  8    Period  Weeks    Status  New            Plan - 11/19/19 1015    Clinical Impression Statement  Pt tolerated session well. Pt needed min A with resisted walking to her left. Patient is demonstrating improved overall balance. Pt scored 4/4 on modified CTSIB.    Personal Factors and Comorbidities  Comorbidity 2    Comorbidities  HTN, CKD stage III    Examination-Activity Limitations  Caring for Others;Carry;Squat    Examination-Participation Restrictions  Cleaning;Community Activity;Driving;Laundry;Shop;Yard Work;Meal Prep    Stability/Clinical Decision Making  Evolving/Moderate complexity    Rehab Potential  Good    PT Frequency  1x / week    PT Duration  8 weeks    PT Treatment/Interventions  ADLs/Self Care Home Management;Gait training;Stair training;Functional mobility training;Therapeutic activities;Therapeutic exercise;Balance training;Neuromuscular re-education;Patient/family education    PT Next Visit Plan  continue with functional strengthening and balance    PT Home Exercise Plan  Piriformis stretch; walking program (goal 30 min /day in 5 weeks)    Consulted and Agree with Plan of Care  Patient;Family member/caregiver    Family Member Consulted  sister (patricia)       Patient will benefit from skilled therapeutic intervention in order to improve the following deficits and impairments:  Abnormal gait, Decreased activity tolerance, Decreased endurance, Decreased mobility, Difficulty walking, Pain  Visit Diagnosis: Muscle weakness (generalized)  Acute cerebrovascular accident (CVA) (HCC)  Other abnormalities of gait and mobility  Unsteadiness on feet     Problem List Patient Active Problem List   Diagnosis Date Noted  . Acute cerebrovascular accident (CVA) (HCC) 10/26/2019  . Chronic kidney disease, stage III (moderate)   . HFrEF (heart failure with reduced ejection fraction) (HCC) 08/05/2019  .  Essential hypertension 07/07/2019  . Nonspecific abnormal electrocardiogram (ECG) (EKG) 07/07/2019    Ileana Ladd, PT 11/19/2019, 10:17 AM  [facility] [address] [place], [place], [address] Phone: [phone]   Fax:  [phone]  Name: Kendra Richardson MRN: [id number] Date of Birth: 12/07/1955

## 2019-11-19 NOTE — Therapy (Signed)
[facility] [address] [place], [place], [address] Phone: [phone]   Fax:  [phone]  Occupational Therapy Treatment  Patient Details  Name: Kendra Richardson MRN: [id number] Date of Birth: Oct 09, 1955 Referring Provider (OT): Dr. Tenny Craw, PCP (Dr. Dina Rich- hospitalist)   Encounter Date: 11/19/2019  OT End of Session - 11/19/19 0851    Visit Number  4    Number of Visits  25    Date for OT Re-Evaluation  02/08/20    Authorization Type  [employer]    Authorization Time Period  60 visit limit, each discipline counts as 1 visit, PT at ortho clinic used 10 visits--may need to decrease OT frequency    Authorization - Visit Number  4    Authorization - Number of Visits  20    OT Start Time  0850    OT Stop Time  0930    OT Time Calculation (min)  40 min    Activity Tolerance  Patient tolerated treatment well    Behavior During Therapy  [facility] for tasks assessed/performed       Past Medical History:  Diagnosis Date  . Abnormal radiographic examination   . CHF (congestive heart failure) (HCC)   . Chronic kidney disease, stage III (moderate)   . Chronic kidney disease, stage III (moderate)   . CN (constipation)   . Essential hypertension, malignant   . HTN (hypertension)   . LVH (left ventricular hypertrophy)   . Protein-calorie malnutrition (HCC)     Past Surgical History:  Procedure Laterality Date  . BREAST EXCISIONAL BIOPSY Left     There were no vitals filed for this visit.  Subjective Assessment - 11/19/19 0849    Subjective   Pt reports exercises are going well at home    Pertinent History  Kendra Richardson is a 64 y.o. female systolic heart failure, CKD 3, hypertension, seen in ED 10/26/19 for evaluation of a syncopal episode. Pt fell and lost consciousness, sustained L eye contusion. 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  PMH: HTN CKD Stage III, CHF, left forzen shoulder per pt report. PTA, pt lived alone and was independent, including driving.    Patient Stated Goals  to get back to taking care of her home and self    Currently in Pain?  No/denies    Pain Onset  1 to 4 weeks ago         Supine, gentle joint mobs to L shoulder for stretch.   Sidelying, AAROM shoulder flex with therapist providing facilitation for incr scapular mobility with no pain.  Supine, shoulder depression, scapular retraction with min v.c.  Shoulder flex AAROM along wall with ball (BUEs) with min cueing for posture/decr compensation  Wall push-ups with min cueing    Arm bike x5 min level 1 for reciprocal movement (forward/backwards without rest).     OT Education - 11/19/19 0854    Education Details  Reviewed HEP--pt returned demo each    Person(s) Educated  Patient    Methods  Explanation;Demonstration;Handout;Verbal cues    Comprehension  Verbal cues required;Returned demonstration;Verbalized understanding       OT Short Term Goals - 11/10/19 1350      OT SHORT TERM GOAL #1   Title  I with inital HEP.    Time  6    Period  Weeks    Status  New  Target Date  12/25/19      OT SHORT TERM GOAL #2   Title  Pt will demonstrate 110 shoulder flexion in prep for functional reach    Baseline  100    Time  6    Period  Weeks    Status  New      OT SHORT TERM GOAL #3   Title  Pt will verbalize understanding of compensatory strategies for visual impairments.    Time  6    Period  Weeks    Status  New      OT SHORT TERM GOAL #4   Title  Pt will perfrom basic home managment/ cooking with supervision demonstrating good safety awareness.    Time  6    Period  Weeks    Status  New      OT SHORT TERM GOAL #5   Title  Further assess cognition in a functional context and set goals as needed.    Time  6    Period  Weeks    Status  New        OT Long Term Goals - 11/10/19  1352      OT LONG TERM GOAL #1   Title  I with updated HEP    Time  12    Period  Weeks    Status  New    Target Date  02/08/20      OT LONG TERM GOAL #2   Title  Pt will retrieve a lightweight object at 115 shoulder flexion with LUE.    Baseline  100    Time  12    Period  Weeks    Status  New      OT LONG TERM GOAL #3   Title  Pt will perform mod complex home managment/ cooking modified independently.    Time  12    Period  Weeks    Status  New      OT LONG TERM GOAL #4   Title  Pt will demonstrate ability to perform a physical and cogntive task simultaneously with 90% or better accuracy in prep for driving.    Time  12    Period  Weeks    Status  New      OT LONG TERM GOAL #5   Title  Pt will navigate a busy environment and locate itmes with 90% or better accuracy and no reports of double vison.    Time  12    Period  Weeks    Status  New      OT LONG TERM GOAL #6   Title  Pt will resume planting flowers modified independently.    Time  12    Period  Weeks    Status  New            Plan - 11/19/19 3419    Clinical Impression Statement  Pt is progressing towards goals.  No shoulder pain during session today.    OT Occupational Profile and History  Detailed Assessment- Review of Records and additional review of physical, cognitive, psychosocial history related to current functional performance    Occupational performance deficits (Please refer to evaluation for details):  ADL's;IADL's;Leisure;Social Participation    Body Structure / Function / Physical Skills  ADL;Endurance;UE functional use;Balance;Flexibility;Pain;FMC;ROM;Coordination;Decreased knowledge of precautions;Decreased knowledge of use of DME;IADL;Strength;Mobility    Cognitive Skills  Attention;Memory;Safety Awareness;Understand;Thought    Rehab Potential  Fair    Clinical Decision Making  Several treatment  options, min-mod task modification necessary    Comorbidities Affecting Occupational  Performance:  May have comorbidities impacting occupational performance    Modification or Assistance to Complete Evaluation   No modification of tasks or assist necessary to complete eval    OT Frequency  2x / week    OT Duration  12 weeks    OT Treatment/Interventions  Self-care/ADL training;Ultrasound;Energy conservation;Visual/perceptual remediation/compensation;Patient/family education;Aquatic Therapy;DME and/or AE instruction;Balance training;Passive range of motion;Paraffin;Cryotherapy;Fluidtherapy;Splinting;Functional Mobility Training;Electrical Stimulation;Moist Heat;Therapeutic exercise;Manual Therapy;Therapeutic activities;Cognitive remediation/compensation;Neuromuscular education    Plan  continue with neuro re-ed/ROM L shoulder, visual compensation strategies    Consulted and Agree with Plan of Care  Patient       Patient will benefit from skilled therapeutic intervention in order to improve the following deficits and impairments:   Body Structure / Function / Physical Skills: ADL, Endurance, UE functional use, Balance, Flexibility, Pain, FMC, ROM, Coordination, Decreased knowledge of precautions, Decreased knowledge of use of DME, IADL, Strength, Mobility Cognitive Skills: Attention, Memory, Safety Awareness, Understand, Thought     Visit Diagnosis: Stiffness of left shoulder, not elsewhere classified  Muscle weakness (generalized)  Visuospatial deficit  Other abnormalities of gait and mobility  Unsteadiness on feet  Attention and concentration deficit    Problem List Patient Active Problem List   Diagnosis Date Noted  . Acute cerebrovascular accident (CVA) ([place]) 10/26/2019  . Chronic kidney disease, stage III (moderate)   . HFrEF (heart failure with reduced ejection fraction) ([place]) 08/05/2019  . Essential hypertension 07/07/2019  . Nonspecific abnormal electrocardiogram (ECG) (EKG) 07/07/2019    [facility] 11/19/2019, 2:20 PM  [place] [address] [place], [place], [address] Phone: [phone]   Fax:  [phone]  Name: Cambrie Sonnenfeld MRN: [id number] Date of Birth: [birth year]   Vianne Bulls, OTR/L [facility] [address] [place], [place]  [address] [phone] phone [phone] 11/19/19 2:21 PM

## 2019-11-24 ENCOUNTER — Ambulatory Visit: Payer: No Typology Code available for payment source | Attending: Internal Medicine | Admitting: Occupational Therapy

## 2019-11-24 ENCOUNTER — Other Ambulatory Visit: Payer: Self-pay

## 2019-11-24 DIAGNOSIS — R2689 Other abnormalities of gait and mobility: Secondary | ICD-10-CM | POA: Diagnosis present

## 2019-11-24 DIAGNOSIS — R2681 Unsteadiness on feet: Secondary | ICD-10-CM | POA: Insufficient documentation

## 2019-11-24 DIAGNOSIS — M25612 Stiffness of left shoulder, not elsewhere classified: Secondary | ICD-10-CM | POA: Diagnosis present

## 2019-11-24 DIAGNOSIS — R41840 Attention and concentration deficit: Secondary | ICD-10-CM | POA: Diagnosis present

## 2019-11-24 DIAGNOSIS — M6281 Muscle weakness (generalized): Secondary | ICD-10-CM | POA: Insufficient documentation

## 2019-11-24 DIAGNOSIS — R41842 Visuospatial deficit: Secondary | ICD-10-CM | POA: Insufficient documentation

## 2019-11-24 NOTE — Therapy (Signed)
[facility] [address] [place], [place], [address] Phone: [phone]   Fax:  [phone]  Occupational Therapy Treatment  Patient Details  Name: Kendra Richardson MRN: [id number] Date of Birth: [birth year] Referring Provider (OT): Dr. Tenny Craw, PCP (Dr. Dina Rich- hospitalist)   Encounter Date: 11/24/2019  OT End of Session - 11/24/19 1822    Visit Number  5    Number of Visits  25    Date for OT Re-Evaluation  02/08/20    Authorization Type  [employer]    Authorization Time Period  60 visit limit, each discipline counts as 1 visit, PT at ortho clinic used 10 visits--may need to decrease OT frequency    Authorization - Visit Number  5    Authorization - Number of Visits  20    OT Start Time  1405    OT Stop Time  1445    OT Time Calculation (min)  40 min    Activity Tolerance  Patient tolerated treatment well    Behavior During Therapy  [facility] for tasks assessed/performed       Past Medical History:  Diagnosis Date  . Abnormal radiographic examination   . CHF (congestive heart failure) (HCC)   . Chronic kidney disease, stage III (moderate)   . Chronic kidney disease, stage III (moderate)   . CN (constipation)   . Essential hypertension, malignant   . HTN (hypertension)   . LVH (left ventricular hypertrophy)   . Protein-calorie malnutrition (HCC)     Past Surgical History:  Procedure Laterality Date  . BREAST EXCISIONAL BIOPSY Left     There were no vitals filed for this visit.  Subjective Assessment - 11/24/19 1821    Subjective   Denies pain    Pertinent History  Kendra Richardson is a 64 y.o. female systolic heart failure, CKD 3, hypertension, seen in ED 10/26/19 for evaluation of a syncopal episode. Pt fell and lost consciousness, sustained L eye contusion. 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  PMH: HTN CKD Stage III, CHF, left forzen shoulder per pt report. PTA, pt lived alone and was independent, including driving.    Patient Stated Goals  to get back to taking care of her home and self    Currently in Pain?  No/denies                 Treatment: Supine, joint and scapular mobs then sidelying shoulder flexion with scapular mobs then supine closed chain, chest press shoulder fle min facilitation and scapular position,  Standing UE ranger for shoulder flexion and horizontal adduction across body with pt stepping, min facilitation, v.c Cane ex for shoulder flexion, abduction and shoulder extension, min v.c Arm bike x 5 mins level 1 Wall pushups and stretch along wall rolling ball up vertical surface min v.c            OT Short Term Goals - 11/10/19 1350      OT SHORT TERM GOAL #1   Title  I with inital HEP.    Time  6    Period  Weeks    Status  New    Target Date  12/25/19      OT SHORT TERM GOAL #2   Title  Pt will demonstrate 110 shoulder flexion in prep for functional reach    Baseline  100    Time  6  Period  Weeks    Status  New      OT SHORT TERM GOAL #3   Title  Pt will verbalize understanding of compensatory strategies for visual impairments.    Time  6    Period  Weeks    Status  New      OT SHORT TERM GOAL #4   Title  Pt will perfrom basic home managment/ cooking with supervision demonstrating good safety awareness.    Time  6    Period  Weeks    Status  New      OT SHORT TERM GOAL #5   Title  Further assess cognition in a functional context and set goals as needed.    Time  6    Period  Weeks    Status  New        OT Long Term Goals - 11/10/19 1352      OT LONG TERM GOAL #1   Title  I with updated HEP    Time  12    Period  Weeks    Status  New    Target Date  02/08/20      OT LONG TERM GOAL #2   Title  Pt will retrieve a lightweight object at 115 shoulder flexion with LUE.    Baseline  100    Time  12     Period  Weeks    Status  New      OT LONG TERM GOAL #3   Title  Pt will perform mod complex home managment/ cooking modified independently.    Time  12    Period  Weeks    Status  New      OT LONG TERM GOAL #4   Title  Pt will demonstrate ability to perform a physical and cogntive task simultaneously with 90% or better accuracy in prep for driving.    Time  12    Period  Weeks    Status  New      OT LONG TERM GOAL #5   Title  Pt will navigate a busy environment and locate itmes with 90% or better accuracy and no reports of double vison.    Time  12    Period  Weeks    Status  New      OT LONG TERM GOAL #6   Title  Pt will resume planting flowers modified independently.    Time  12    Period  Weeks    Status  New            Plan - 11/24/19 1822    Clinical Impression Statement  Pt is progressing towards goals.  No shoulder pain during session today. She reports that she is using her arm more    OT Occupational Profile and History  Detailed Assessment- Review of Records and additional review of physical, cognitive, psychosocial history related to current functional performance    Occupational performance deficits (Please refer to evaluation for details):  ADL's;IADL's;Leisure;Social Participation    Body Structure / Function / Physical Skills  ADL;Endurance;UE functional use;Balance;Flexibility;Pain;FMC;ROM;Coordination;Decreased knowledge of precautions;Decreased knowledge of use of DME;IADL;Strength;Mobility    Cognitive Skills  Attention;Memory;Safety Awareness;Understand;Thought    Rehab Potential  Fair    Clinical Decision Making  Several treatment options, min-mod task modification necessary    Comorbidities Affecting Occupational Performance:  May have comorbidities impacting occupational performance    Modification or Assistance to Complete Evaluation   No modification of tasks or  assist necessary to complete eval    OT Frequency  2x / week    OT Duration  12 weeks     OT Treatment/Interventions  Self-care/ADL training;Ultrasound;Energy conservation;Visual/perceptual remediation/compensation;Patient/family education;Aquatic Therapy;DME and/or AE instruction;Balance training;Passive range of motion;Paraffin;Cryotherapy;Fluidtherapy;Splinting;Functional Mobility Training;Electrical Stimulation;Moist Heat;Therapeutic exercise;Manual Therapy;Therapeutic activities;Cognitive remediation/compensation;Neuromuscular education    Plan  continue with neuro re-ed/ROM L shoulder, visual compensation strategies    Consulted and Agree with Plan of Care  Patient       Patient will benefit from skilled therapeutic intervention in order to improve the following deficits and impairments:   Body Structure / Function / Physical Skills: ADL, Endurance, UE functional use, Balance, Flexibility, Pain, FMC, ROM, Coordination, Decreased knowledge of precautions, Decreased knowledge of use of DME, IADL, Strength, Mobility Cognitive Skills: Attention, Memory, Safety Awareness, Understand, Thought     Visit Diagnosis: Muscle weakness (generalized)  Visuospatial deficit  Stiffness of left shoulder, not elsewhere classified    Problem List Patient Active Problem List   Diagnosis Date Noted  . Acute cerebrovascular accident (CVA) (HCC) 10/26/2019  . Chronic kidney disease, stage III (moderate)   . HFrEF (heart failure with reduced ejection fraction) (HCC) 08/05/2019  . Essential hypertension 07/07/2019  . Nonspecific abnormal electrocardiogram (ECG) (EKG) 07/07/2019    Thaddeus Evitts 11/24/2019, 6:23 PM  [place] [facility] [address] [place], [place], [address] Phone: [phone]   Fax:  [phone]  Name: Kendra Richardson MRN: [id number] Date of Birth: 04-01-1956

## 2019-11-26 ENCOUNTER — Other Ambulatory Visit: Payer: Self-pay

## 2019-11-26 ENCOUNTER — Ambulatory Visit: Payer: No Typology Code available for payment source

## 2019-11-26 ENCOUNTER — Other Ambulatory Visit: Payer: Self-pay | Admitting: *Deleted

## 2019-11-26 DIAGNOSIS — R2689 Other abnormalities of gait and mobility: Secondary | ICD-10-CM

## 2019-11-26 DIAGNOSIS — R2681 Unsteadiness on feet: Secondary | ICD-10-CM

## 2019-11-26 DIAGNOSIS — M6281 Muscle weakness (generalized): Secondary | ICD-10-CM | POA: Diagnosis not present

## 2019-11-26 NOTE — Patient Outreach (Signed)
[facility]) Care Management  11/26/2019  Kendra Richardson 02/11/1956 [id number]   Subjective: Telephone call to patient's home / mobile number, spoke with patient, and HIPAA verified.  Patient states she remembers speaking with this RNCM, is doing alright, getting much better everyday, has no more headaches, doing well with physical therapy, doing well with occupational therapy, and will be discharged from therapy soon due to meeting goals.  States providers are pleased with her progress, encourage her to take it easy, relax as needed, walk a least 30 minutes per day, build endurance, pace activities,  and not overdo activities of daily living/ home management.   States she following MD's recommendations, able to walk at least  30 minutes daily without difficulty, and relaxing as needed.  States her 11/11/2019 cardiology appointment went well and has a scheduled follow up appointment with neurologist on 12/01/2019.  States she has contacted the interventional radiologist neurologist times 2 regarding aneurysm procedure scheduling, has not received a call back, will discuss with neurologist during 12/01/2019 appointment, and will notify this RNCM if assistance needed to schedule procedure.    Patient states her blood pressure has decreased nicely and today it was 129/79.  Patient states she does not have any education material, EMMI follow up, care coordination, care management, disease monitoring, transportation, community resource, or pharmacy needs at this time.  States she is very appreciative of the follow up, is in agreement  to receive 1 additional follow up call to assess for further CM needs, and is in agreement to receive [facility] Care Management EMMI follow up calls as needed.   Patient requested patient outreach after 6/9/201 neurologist appointment and patient will reach out to this RNCM if assistance needed prior to next patient outreach.      Objective:Per KPN (Knowledge  Performance Now, point of care tool) and chart review,patient hospitalized 10/26/2019 - 10/28/2019 for acute CVA,Acute thrombotic stroke, left ACA territory infarction. Patient also has a history of chronic systolic heart failure,hypertension,andCKD stage IIIa.    Assessment:Received Medicare EMMI Stroke Red Flag Alert follow up referral on 11/01/2019. Red Flag Alert Triggers, Day #1, times 2, patient answered no to the following question: Scheduled a follow-up appointment?Patient answered yes to the following question:Problems setting up rehab?EMMI follow up completed and will follow up to assess further care management needs.      Plan:RNCM will call patient for telephone outreach attempt, within14business days, EMMI follow up, to assess for further CM needs, and proceed with case closure, within 10 business days if no return call, after 4th unsuccessful outreach call.    Kendra Richardson, BSN, CCM [facility] Care Management [facility] Telephonic CM Phone: [phone] Fax: [phone]

## 2019-11-26 NOTE — Therapy (Signed)
[facility] [address] [place], [place], [address] Phone: [phone]   Fax:  [phone]  Physical Therapy Treatment  Patient Details  Name: Kendra Richardson MRN: [id number] Date of Birth: December 14, 1955 No data recorded  Encounter Date: 11/26/2019  PT End of Session - 11/26/19 1348    Visit Number  4    Number of Visits  9    Date for PT Re-Evaluation  01/04/20    PT Start Time  1315    PT Stop Time  1400    PT Time Calculation (min)  45 min    Equipment Utilized During Treatment  Gait belt    Activity Tolerance  Patient tolerated treatment well    Behavior During Therapy  [facility] for tasks assessed/performed       Past Medical History:  Diagnosis Date  . Abnormal radiographic examination   . CHF (congestive heart failure) ([place])   . Chronic kidney disease, stage III (moderate)   . Chronic kidney disease, stage III (moderate)   . CN (constipation)   . Essential hypertension, malignant   . HTN (hypertension)   . LVH (left ventricular hypertrophy)   . Protein-calorie malnutrition ([place])     Past Surgical History:  Procedure Laterality Date  . BREAST EXCISIONAL BIOPSY Left     There were no vitals filed for this visit.  Subjective Assessment - 11/26/19 1314    Subjective  Pt reports she is doing well.    Pertinent History  : HTN, CKD stage III    Limitations  Walking    How long can you sit comfortably?  no issues    How long can you stand comfortably?  10 min    How long can you walk comfortably?  5 min    Diagnostic tests  10/26/19: MRI of brain: IMPRESSION:1. 2 small areas of acute infarct in the left frontal lobe.2. Moderate chronic microvascular ischemic change. Scattered areasof chronic microhemorrhage in the brain compatible with chronichypertension. . Negative CTA for large vessel occlusion.2. Moderate to advanced intracranial atherosclerotic disease asabove. Notable findings include severe left  P1 and bilateral P2stenoses, with moderate stenoses about the carotid siphonsbilaterally. Extensive small vessel atheromatous irregularity, mostpronounced about the left MCA and bilateral PCA distributions.3. 5 mm left PCOM aneurysm.    Patient Stated Goals  Walk better    Currently in Pain?  No/denies    Pain Onset  1 to 4 weeks ago         Nustep: 10' Carrying 10lb in her R hand: 220' Carrying 10lb with one rail: 12 steps up and down SLS on airex: hip flexion, abduction, extension: 2 x 10 Partial tandem on airex with EC: 2 x 30" R and L SLS on floor with red band: hip flexion, abduction, extension: 5x R and L Side steps: green band: 3 x 10 Monster walk: fwd and bwd: 2 x 10  Access Code: [email or web address] URL: [email or web address] Date: 11/26/2019 Prepared by: Markus Jarvis  Exercises Standing Hip Flexion with Resistance Loop - 1 x daily - 7 x weekly - 10 reps - 2 sets Hip Abduction with Resistance Loop - 1 x daily - 7 x weekly - 10 reps - 2 sets Hip Extension with Resistance Loop - 1 x daily - 7 x weekly - 10 reps - 2 sets Side Stepping with Resistance at Ankles - 1 x daily - 7 x weekly - 3 sets - 10 reps Forward Monster Walks - 1 x daily - 7  x weekly - 3 sets - 10 reps Backward Monster Walks - 1 x daily - 7 x weekly - 3 sets - 10 reps                          PT Short Term Goals - 11/26/19 1322      PT SHORT TERM GOAL #1   Title  Patient will be able to ambulate 20 min without AD and without significant gait deviations or SOB to improve walking endurance.    Baseline  5 min (eval), 30 min (11/26/19)    Time  4    Period  Weeks    Status  Achieved    Target Date  12/07/19      PT SHORT TERM GOAL #2   Title  Pt will be able to carry 10-15 lb weight in hand to be able to carry grocery bags from car to house, about 200 feet    Baseline  sister helping with groceries, able to carry 10lb KB 220' and up and down 12 steps with use of one rail    Time   4    Period  Weeks    Status  Achieved    Target Date  12/07/19        PT Long Term Goals - 11/26/19 1324      PT LONG TERM GOAL #1   Title  Patient will be able to carry a laundry basket up stairs to her room on the stairs I to return to PLOF    Baseline  Sister is helping out with ADLs., pt able to carry basket up down stairs now with SBA    Time  8    Period  Weeks    Status  Achieved      PT LONG TERM GOAL #2   Title  Pt will be able to ambulate for 30 min independently without any significant gait deviations to return to PLOF    Baseline  5 min (eval), 30 min(11/26/19)    Time  8    Period  Weeks    Status  Achieved      PT LONG TERM GOAL #3   Title  Pt will be I and compliant with HEP to self manage her symptoms    Baseline  Not perfomring exercises    Time  8    Period  Weeks    Status  New            Plan - 11/26/19 1340    Clinical Impression Statement  Pt has met all of short term goals. Pt has met most of her long term goals as well. patient is compliant with HEP and walking program.    Personal Factors and Comorbidities  Comorbidity 2    Comorbidities  HTN, CKD stage III    Examination-Activity Limitations  Caring for Others;Carry;Squat    Examination-Participation Restrictions  Cleaning;Community Activity;Driving;Laundry;Shop;Yard Work;Meal Prep    Stability/Clinical Decision Making  Evolving/Moderate complexity    Rehab Potential  Good    PT Frequency  1x / week    PT Duration  8 weeks    PT Treatment/Interventions  ADLs/Self Care Home Management;Gait training;Stair training;Functional mobility training;Therapeutic activities;Therapeutic exercise;Balance training;Neuromuscular re-education;Patient/family education    PT Next Visit Plan  continue with functional strengthening and balance    PT Home Exercise Plan  Piriformis stretch; walking program (goal 30 min /day in 5 weeks)    Consulted and  Agree with Plan of Care  Patient;Family member/caregiver     Family Member Consulted  sister (patricia)       Patient will benefit from skilled therapeutic intervention in order to improve the following deficits and impairments:  Abnormal gait, Decreased activity tolerance, Decreased endurance, Decreased mobility, Difficulty walking, Pain  Visit Diagnosis: Muscle weakness (generalized)  Other abnormalities of gait and mobility  Unsteadiness on feet     Problem List Patient Active Problem List   Diagnosis Date Noted  . Acute cerebrovascular accident (CVA) ([place]) 10/26/2019  . Chronic kidney disease, stage III (moderate)   . HFrEF (heart failure with reduced ejection fraction) ([facility]) 08/05/2019  . Essential hypertension 07/07/2019  . Nonspecific abnormal electrocardiogram (ECG) (EKG) 07/07/2019    Prince Solian Dell Briner,PT 11/26/2019, 2:02 PM  [facility] [address] [place], [place], [address] Phone: [phone]   Fax:  [phone]  Name: Cheyane Ayon MRN: [id number] Date of Birth: 07-02-1955

## 2019-12-01 ENCOUNTER — Ambulatory Visit (INDEPENDENT_AMBULATORY_CARE_PROVIDER_SITE_OTHER): Payer: No Typology Code available for payment source | Admitting: Adult Health

## 2019-12-01 ENCOUNTER — Encounter: Payer: Self-pay | Admitting: Adult Health

## 2019-12-01 ENCOUNTER — Other Ambulatory Visit: Payer: Self-pay

## 2019-12-01 VITALS — BP 114/79 | HR 72 | Ht 64.0 in | Wt 121.0 lb

## 2019-12-01 DIAGNOSIS — I63512 Cerebral infarction due to unspecified occlusion or stenosis of left middle cerebral artery: Secondary | ICD-10-CM

## 2019-12-01 DIAGNOSIS — I671 Cerebral aneurysm, nonruptured: Secondary | ICD-10-CM | POA: Diagnosis not present

## 2019-12-01 DIAGNOSIS — E785 Hyperlipidemia, unspecified: Secondary | ICD-10-CM | POA: Diagnosis not present

## 2019-12-01 DIAGNOSIS — I10 Essential (primary) hypertension: Secondary | ICD-10-CM | POA: Diagnosis not present

## 2019-12-01 NOTE — Patient Instructions (Signed)
Continue to work with therapies for ongoing improvement  Recommend gradual return to driving as below: Graduated return to driving as recommended.  It is recommended that you first drive with another licensed driver in an empty parking lot. If you do well with this, you can drive on a quiet street with the licensed driver.  If you do well with this, you can drive on a busy street with a licensed driver.  If you continue to do well, you can be cleared to drive independently.  For the first month after resuming driving, it is recommend no nighttime, busy/heavy traffic roads or Interstate driving.   Complete cardiac monitor next week which will rule out underlying heart rhymes that could have contributed to your stroke  Continue aspirin 81 mg daily and clopidogrel 75 mg daily  and Crestor  for secondary stroke prevention  Continue to follow up with PCP regarding cholesterol and blood pressure management   I will reach out to Dr. Corliss Skains office in regards to aneurysm and procedure - their office should be reaching out soon to schedule  Continue to monitor blood pressure at home  Maintain strict control of hypertension with blood pressure goal below 130/90, diabetes with hemoglobin A1c goal below 6.5% and cholesterol with LDL cholesterol (bad cholesterol) goal below 70 mg/dL. I also advised the patient to eat a healthy diet with plenty of whole grains, cereals, fruits and vegetables, exercise regularly and maintain ideal body weight.  Followup in the future with me in 6 months or call earlier if needed       Thank you for coming to see [place] at [facility]. I hope we have been able to provide you high quality care today.  You may receive a patient satisfaction survey over the next few weeks. We would appreciate your feedback and comments so that we may continue to improve ourselves and the health of our patients.

## 2019-12-01 NOTE — Progress Notes (Signed)
[facility] [address] [place]. [place] [address] [phone]       HOSPITAL FOLLOW UP NOTE  [name] Date of Birth:  1956/04/05 Medical Record Number:  [id number]   Reason for Referral:  hospital stroke follow up    SUBJECTIVE:   CHIEF COMPLAINT:  Chief Complaint  Patient presents with  . Follow-up    Rm 9 here or a f/u from a stroke. Pt is having no new sx. She is here with her sister.    HPI:   [name] is a 64 y.o. female with history of systolic heart failure, CKD 3, hypertension  who presented on 10/26/2019 to [facility] ED with syncope / LOC with resultant blurred and double vision, HA.  Stroke work-up revealed 2 punctate MCA cortical infarcts (left frontal lobe) most likely secondary to large vessel disease source in the setting of syncopal event although cardioembolic source cannot be completely ruled out.  CTA showed moderate to advanced intracranial atherosclerosis and left PCOM aneurysm 5 mm.  Recommended DAPT for 3 months then Plavix alone due to intracranial stenosis.  Recommended 30-day cardiac event monitor outpatient to follow AF.  Syncopal episode possibly related to BP medication transition without further syncopal events during admission.  History of HTN stable but elevated during admission currently being managed by cardiology.  History of cardiomyopathy with 2D echo 50 to 55% (prior 30 to 35%) with ongoing follow-up by cardiology.  LDL 101 initiate atorvastatin 40 mg daily.  Will follow up outpatient with Dr. Corliss Skains in regards to cerebral aneurysm.  Other stroke risk factors include advanced age, former tobacco use, family history of stroke and chronic systolic CHF but no prior stroke history.  Other active problems include CKD stage III.  Evaluated by therapies and recommended outpatient PT and discharged home in stable condition.  Stroke: 2 punctate MCA cortical infarcts most likely  secondary to large vessel disease source in the setting of syncopal episode.  Cardioembolic source cannot be can be ruled out.  MRI  2 punctate L frontal lobe infarcts. Moderate small vessel disease. Scattered chronic microhemorrhages.  CTA head & neck no LVO. Moderate to advanced intracranial atherosclerosis (L P1 severe, B P2 stenoses, B ICA siphon stenoses, L MCA and B PCA small vessel atherosclerosis). L PCOM aneurysm 57mm.  2D Echo EF 50 to 50%  LDL 101  HgbA1c 5.6  Lovenox 40 mg sq daily for VTE prophylaxis  aspirin 81 mg daily prior to admission, now on aspirin 325 mg daily and clopidogrel 75 mg daily. Continue DAPT x 3 months then plavix alone given intracranial stenosis  Consider 30-day cardiac event monitoring to rule out AF  Therapy recommendations:  OP PT, no SLP  Disposition:  home   Today, 12/01/2019, Kendra Richardson is being seen for hospital follow-up accompanied by her sister.  She has been doing since discharge without residual deficits and has returned back to baseline.  Initially experiencing headaches which have since improved.  She questions possible return to driving.  She has continued on DAPT without bleeding or bruising.  Experienced statin myalgias on atorvastatin therefore discontinued and initiated Crestor with resolution of myalgias.  Blood pressure today 114/79.  Currently wearing cardiac monitor which will be completed next week.  Follow-up with Dr. Corliss Skains on 11/05/2019 with patient wishing to proceed with diagnostic angiogram with intent to treat left PCOM aneurysm.  Per sister, they have attempted to contact office but have not received a return call.  No further concerns at this time.     ROS:   14 system review of systems performed and negative with exception of no complaints  PMH:  Past Medical History:  Diagnosis Date  . Abnormal radiographic examination   . CHF (congestive heart failure) ([place])   . Chronic kidney disease, stage III (moderate)   .  Chronic kidney disease, stage III (moderate)   . CN (constipation)   . Essential hypertension, malignant   . HTN (hypertension)   . LVH (left ventricular hypertrophy)   . Protein-calorie malnutrition ([facility])     PSH:  Past Surgical History:  Procedure Laterality Date  . BREAST EXCISIONAL BIOPSY Left     Social History:  Social History   Socioeconomic History  . Marital status: Single    Spouse name: Not on file  . Number of children: Not on file  . Years of education: Not on file  . Highest education level: Not on file  Occupational History  . Not on file  Tobacco Use  . Smoking status: Former Smoker  Substance and Sexual Activity  . Alcohol use: Not on file  . Drug use: Not on file  . Sexual activity: Not on file  Other Topics Concern  . Not on file  Social History Narrative  . Not on file   Social Determinants of Health   Financial Resource Strain:   . Difficulty of Paying Living Expenses:   Food Insecurity:   . Worried About Charity fundraiser in the Last Year:   . Arboriculturist in the Last Year:   Transportation Needs: No Transportation Needs  . Lack of Transportation (Medical): No  . Lack of Transportation (Non-Medical): No  Physical Activity:   . Days of Exercise per Week:   . Minutes of Exercise per Session:   Stress:   . Feeling of Stress :   Social Connections:   . Frequency of Communication with Friends and Family:   . Frequency of Social Gatherings with Friends and Family:   . Attends Religious Services:   . Active Member of Clubs or Organizations:   . Attends Archivist Meetings:   Marland Kitchen Marital Status:   Intimate Partner Violence:   . Fear of Current or Ex-Partner:   . Emotionally Abused:   Marland Kitchen Physically Abused:   . Sexually Abused:     Family History:  Family History  Problem Relation Age of Onset  . Breast cancer Mother   . Breast cancer Maternal Aunt   . Stroke Father   . Kidney disease Father     Medications:   Current  Outpatient Medications on File Prior to Visit  Medication Sig Dispense Refill  . amLODipine (NORVASC) 10 MG tablet Take 10 mg by mouth daily.    Marland Kitchen aspirin EC 81 MG tablet Take 1 tablet (81 mg total) by mouth daily. 30 tablet 2  . carvedilol (COREG) 25 MG tablet Take 1 tablet (25 mg total) by mouth 2 (two) times daily. 180 tablet 3  . clopidogrel (PLAVIX) 75 MG tablet Take 1 tablet (75 mg total) by mouth daily. 30 tablet 2  . Multiple Vitamin (MULTIVITAMIN) tablet Take 1 tablet by mouth daily.    . rosuvastatin (CRESTOR) 5 MG tablet Take 5 mg by mouth 3 (three) times a week.    . sacubitril-valsartan (ENTRESTO) 97-103 MG Take 1 tablet by mouth 2 (two) times daily. 180 tablet 3   No current facility-administered medications on file prior to visit.  Allergies:  No Known Allergies    OBJECTIVE:  Physical Exam  Vitals:   12/01/19 1406  BP: 114/79  Pulse: 72  Weight: 121 lb (54.9 kg)  Height: 5\' 4"  (1.626 m)   Body mass index is 20.77 kg/m. No exam data present  No flowsheet data found.   General: well developed, well nourished,  pleasant middle-aged African-American female, seated, in no evident distress Head: head normocephalic and atraumatic.   Neck: supple with no carotid or supraclavicular bruits Cardiovascular: regular rate and rhythm, no murmurs Musculoskeletal: no deformity Skin:  no rash/petichiae Vascular:  Normal pulses all extremities   Neurologic Exam Mental Status: Awake and fully alert.   Fluent speech and language.  Oriented to place and time. Recent and remote memory intact. Attention span, concentration and fund of knowledge appropriate. Mood and affect appropriate.  Cranial Nerves: Fundoscopic exam reveals sharp disc margins. Pupils equal, briskly reactive to light. Extraocular movements full without nystagmus. Visual fields full to confrontation. Hearing intact. Facial sensation intact. Face, tongue, palate moves normally and symmetrically.  Motor: Normal  bulk and tone. Normal strength in all tested extremity muscles. Sensory.: intact to touch , pinprick , position and vibratory sensation.  Coordination: Rapid alternating movements normal in all extremities. Finger-to-nose and heel-to-shin performed accurately bilaterally. Gait and Station: Arises from chair without difficulty. Stance is normal. Gait demonstrates normal stride length and balance Reflexes: 1+ and symmetric. Toes downgoing.     NIHSS  0 Modified Rankin  0     ASSESSMENT: Peytan Andringa Wechter is a 64 y.o. year old female presented with syncope/loss of consciousness with resultant blurred and double vision and headache on 10/26/2019 with stroke work-up revealing 2 punctate MCA cortical infarcts most likely secondary to large vessel disease source in setting of syncopal episode although cardioembolic source cannot be completely ruled out.  Vascular risk factors include intracranial stenosis, HTN, HLD, cardiomyopathy, CHF, L PCOM aneurysm 5 mm and former tobacco use.  Recovered well from a stroke standpoint without residual deficits.     PLAN:  1. Left MCA stroke: Continue aspirin 81 mg daily and clopidogrel 75 mg daily  and Crestor for secondary stroke prevention.  Complete 3 months DAPT and then Plavix alone.  Cardiac monitor will be completed within 1 week. maintain strict control of hypertension with blood pressure goal below 130/90, diabetes with hemoglobin A1c goal below 6.5% and cholesterol with LDL cholesterol (bad cholesterol) goal below 70 mg/dL.  I also advised the patient to eat a healthy diet with plenty of whole grains, cereals, fruits and vegetables, exercise regularly with at least 30 minutes of continuous activity daily and maintain ideal body weight. 2. Intracranial stenosis: Complete 3 months DAPT as well as ongoing use of statin and ongoing follow-up with IR for surveillance monitoring 3. Cerebral aneurysm: We will attempt to reach out to IR as patient has  had difficulty contacting the office as she wishes to proceed with diagnostic angiogram with intent to treat.  Ongoing use of DAPT further than 3 months recommendation from a stroke standpoint will be managed by IR 4. HTN: Stable.  Continue to follow with PCP for monitoring and management 5. HLD: Continuation of Crestor and continue to follow with PCP for prescribing, monitoring and management 6. Cleared to return to driving on a graduated return to driving basis as she has recovered well from a stroke standpoint without residual deficits.  Graduated return driving discussed with both patient and sister and advised sister if any concerns in  regards to driving, would recommend driving rehab at that time    Follow up in 6 months or call earlier if needed   I spent 45 minutes of face-to-face and non-face-to-face time with patient and sister.  This included previsit chart review, lab review, study review, order entry, electronic health record documentation, patient education regarding recent stroke, residual deficits, importance of managing stroke risk factors and answered all questions to patient satisfaction     Ihor Austin, [facility]  [facility] [address] [place], [place] [address]  Phone [phone] Fax [phone] Note: This document was prepared with digital dictation and possible smart phrase technology. Any transcriptional errors that result from this process are unintentional.

## 2019-12-03 ENCOUNTER — Ambulatory Visit: Payer: No Typology Code available for payment source

## 2019-12-03 ENCOUNTER — Other Ambulatory Visit: Payer: Self-pay

## 2019-12-03 ENCOUNTER — Encounter: Payer: Self-pay | Admitting: Occupational Therapy

## 2019-12-03 ENCOUNTER — Ambulatory Visit: Payer: No Typology Code available for payment source | Admitting: Occupational Therapy

## 2019-12-03 DIAGNOSIS — R41842 Visuospatial deficit: Secondary | ICD-10-CM

## 2019-12-03 DIAGNOSIS — M6281 Muscle weakness (generalized): Secondary | ICD-10-CM

## 2019-12-03 DIAGNOSIS — R41840 Attention and concentration deficit: Secondary | ICD-10-CM

## 2019-12-03 DIAGNOSIS — R2681 Unsteadiness on feet: Secondary | ICD-10-CM

## 2019-12-03 DIAGNOSIS — M25612 Stiffness of left shoulder, not elsewhere classified: Secondary | ICD-10-CM

## 2019-12-03 DIAGNOSIS — R2689 Other abnormalities of gait and mobility: Secondary | ICD-10-CM

## 2019-12-03 NOTE — Therapy (Signed)
[facility] [address] [place], [place], [address] Phone: [phone]   Fax:  [phone]  Occupational Therapy Treatment  Patient Details  Name: Kendra Richardson MRN: [id number] Date of Birth: 20-Nov-1955 Referring Provider (OT): Dr. Harrington Challenger, PCP (Dr. Nigel Bridgeman- hospitalist)   Encounter Date: 12/03/2019   OT End of Session - 12/03/19 1239    Visit Number 6    Number of Visits 25    Date for OT Re-Evaluation 02/08/20    Authorization Type U[employer]    Authorization Time Period 60 visit limit, each discipline counts as 1 visit, PT at ortho clinic used 10 visits--may need to decrease OT frequency    Authorization - Visit Number 6    Authorization - Number of Visits 20    OT Start Time 1233    OT Stop Time 1315    OT Time Calculation (min) 42 min    Activity Tolerance Patient tolerated treatment well    Behavior During Therapy [facility] for tasks assessed/performed           Past Medical History:  Diagnosis Date  . Abnormal radiographic examination   . CHF (congestive heart failure) ([place])   . Chronic kidney disease, stage III (moderate)   . Chronic kidney disease, stage III (moderate)   . CN (constipation)   . Essential hypertension, malignant   . HTN (hypertension)   . LVH (left ventricular hypertrophy)   . Protein-calorie malnutrition ([place])     Past Surgical History:  Procedure Laterality Date  . BREAST EXCISIONAL BIOPSY Left     There were no vitals filed for this visit.   Subjective Assessment - 12/03/19 1238    Subjective  Denies pain    Pertinent History Kendra Richardson is a 64 y.o. female systolic heart failure, CKD 3, hypertension, seen in ED 10/26/19 for evaluation of a syncopal episode. Pt fell and lost consciousness, sustained L eye contusion. 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   PMH: HTN CKD Stage III, CHF, left forzen shoulder per pt report. PTA, pt lived alone and was independent, including driving.    Patient Stated Goals to get back to taking care of her home and self    Currently in Pain? No/denies              Supine, gentle joint mobs to L shoulder for stretch.  Circumduction with 1lb wt at 90* and protraction/retraction with 1lb wt. With min-mod cueing for positioning.     Sidelying, AAROM shoulder flex with therapist providing facilitation for incr scapular mobility with no pain.  Supine, closed-chain shoulder flex, chest press, and AAROM   Shoulder flex AAROM along wall with ball (BUEs) with min cueing for posture/decr compensation  Wall push-ups with min cueing    Arm bike x6 min level 3 for reciprocal movement (forward/backwards without rest), min v.c. initially for positioning.  Quadruped, cat/cow positions and alternating UE lifts for incr shoulder stability/mobility with min cueing.  Environmental scanning in busy environment with 100% accuracy and no reports of dizziness/diplopia.    Shoulder flex with UE ranger with forward step for stretch with no reports of pain.        OT Short Term Goals - 12/03/19 1239      OT SHORT TERM GOAL #1   Title I with inital HEP.    Time 6    Period Weeks  Status Achieved    Target Date 12/25/19      OT SHORT TERM GOAL #2   Title Pt will demonstrate 110 shoulder flexion in prep for functional reach    Baseline 100    Time 6    Period Weeks    Status Achieved   12/03/19:  110*     OT SHORT TERM GOAL #3   Title Pt will verbalize understanding of compensatory strategies for visual impairments.    Time 6    Period Weeks    Status Deferred   12/03/19:  100% accuarcy with environmental scanning in busy environment, no complaints of diplopia or dizzness.     OT SHORT TERM GOAL #4   Title Pt will perfrom basic home managment/ cooking with supervision demonstrating good safety awareness.     Time 6    Period Weeks    Status Achieved   12/03/19:  per pt report     OT SHORT TERM GOAL #5   Title Further assess cognition in a functional context and set goals as needed.    Time 6    Period Weeks    Status Deferred   12/03/19:  appears WFL for functional tasks in clinic            OT Long Term Goals - 12/03/19 1330      OT LONG TERM GOAL #1   Title I with updated HEP    Time 12    Period Weeks    Status New      OT LONG TERM GOAL #2   Title Pt will retrieve a lightweight object at 115 shoulder flexion with LUE.    Baseline 100    Time 12    Period Weeks    Status New      OT LONG TERM GOAL #3   Title Pt will perform mod complex home managment/ cooking modified independently.    Time 12    Period Weeks    Status New      OT LONG TERM GOAL #4   Title Pt will demonstrate ability to perform a physical and cogntive task simultaneously with 90% or better accuracy in prep for driving.    Time 12    Period Weeks    Status New      OT LONG TERM GOAL #5   Title Pt will navigate a busy environment and locate itmes with 90% or better accuracy and no reports of double vison.    Time 12    Period Weeks    Status Achieved   12/03/19:  100% with no reports of diplopia     OT LONG TERM GOAL #6   Title Pt will resume planting flowers modified independently.    Time 12    Period Weeks    Status New                 Plan - 12/03/19 1239    Clinical Impression Statement Pt is progressing towards goals.  She reports no shoulder pain and demo incr ROM.  Pt also performed environmental scanning in busy environment with 100% accuracy.    OT Occupational Profile and History Detailed Assessment- Review of Records and additional review of physical, cognitive, psychosocial history related to current functional performance    Occupational performance deficits (Please refer to evaluation for details): ADL's;IADL's;Leisure;Social Participation    Body Structure / Function /  Physical Skills ADL;Endurance;UE functional use;Balance;Flexibility;Pain;FMC;ROM;Coordination;Decreased knowledge of precautions;Decreased knowledge of use of DME;IADL;Strength;Mobility  Cognitive Skills Attention;Memory;Safety Awareness;Understand;Thought    Rehab Potential Fair    Clinical Decision Making Several treatment options, min-mod task modification necessary    Comorbidities Affecting Occupational Performance: May have comorbidities impacting occupational performance    Modification or Assistance to Complete Evaluation  No modification of tasks or assist necessary to complete eval    OT Frequency 2x / week    OT Duration 12 weeks    OT Treatment/Interventions Self-care/ADL training;Ultrasound;Energy conservation;Visual/perceptual remediation/compensation;Patient/family education;Aquatic Therapy;DME and/or AE instruction;Balance training;Passive range of motion;Paraffin;Cryotherapy;Fluidtherapy;Splinting;Functional Mobility Training;Electrical Stimulation;Moist Heat;Therapeutic exercise;Manual Therapy;Therapeutic activities;Cognitive remediation/compensation;Neuromuscular education    Plan continue with neuro re-ed/ROM L shoulder, divided attention, ?add quadruped to HEP    Consulted and Agree with Plan of Care Patient           Patient will benefit from skilled therapeutic intervention in order to improve the following deficits and impairments:   Body Structure / Function / Physical Skills: ADL, Endurance, UE functional use, Balance, Flexibility, Pain, FMC, ROM, Coordination, Decreased knowledge of precautions, Decreased knowledge of use of DME, IADL, Strength, Mobility Cognitive Skills: Attention, Memory, Safety Awareness, Understand, Thought     Visit Diagnosis: Muscle weakness (generalized)  Unsteadiness on feet  Visuospatial deficit  Stiffness of left shoulder, not elsewhere classified  Attention and concentration deficit    Problem List Patient Active Problem  List   Diagnosis Date Noted  . Acute cerebrovascular accident (CVA) (H[facility]) 10/26/2019  . Chronic kidney disease, stage III (moderate)   . HFrEF (heart failure with reduced ejection fraction) (HCC) 08/05/2019  . Essential hypertension 07/07/2019  . Nonspecific abnormal electrocardiogram (ECG) (EKG) 07/07/2019    [facility] 12/03/2019, 2:02 PM  [facility] [address] [place], [place], [address] Phone: [phone]   Fax:  [phone]  Name: Kendra Richardson MRN: [id number] Date of Birth: 1955/10/28   Willa Frater, OTR/L [facility] [address] [place], [place]  [address] [phone] phone [phone] 12/03/19 2:02 PM

## 2019-12-03 NOTE — Therapy (Signed)
[facility] [address] [place], [place], [address] Phone: [phone]   Fax:  [phone]  Physical Therapy Discharge Summary  Patient Details  Name: Kendra Richardson MRN: [id number] Date of Birth: [birth year] No data recorded  Encounter Date: 12/03/2019   PT End of Session - 12/03/19 1351    Visit Number 5    Number of Visits 9    Date for PT Re-Evaluation 01/04/20           Past Medical History:  Diagnosis Date  . Abnormal radiographic examination   . CHF (congestive heart failure) ([place])   . Chronic kidney disease, stage III (moderate)   . Chronic kidney disease, stage III (moderate)   . CN (constipation)   . Essential hypertension, malignant   . HTN (hypertension)   . LVH (left ventricular hypertrophy)   . Protein-calorie malnutrition ([place])     Past Surgical History:  Procedure Laterality Date  . BREAST EXCISIONAL BIOPSY Left     There were no vitals filed for this visit.   Subjective Assessment - 12/03/19 1321    Subjective pt reports she has clearance from MD for driving as long as someone is in the car with her. She reports that she has been cooking.    Patient is accompained by: Family member    Pertinent History : HTN, CKD stage III    Limitations Walking    How long can you sit comfortably? no issues    How long can you stand comfortably? 10 min    How long can you walk comfortably? 5 min    Diagnostic tests 10/26/19: MRI of brain: IMPRESSION:1. 2 small areas of acute infarct in the left frontal lobe.2. Moderate chronic microvascular ischemic change. Scattered areasof chronic microhemorrhage in the brain compatible with chronichypertension. . Negative CTA for large vessel occlusion.2. Moderate to advanced intracranial atherosclerotic disease asabove. Notable findings include severe left P1 and bilateral P2stenoses, with moderate stenoses about the carotid siphonsbilaterally. Extensive  small vessel atheromatous irregularity, mostpronounced about the left MCA and bilateral PCA distributions.3. 5 mm left PCOM aneurysm.    Patient Stated Goals Walk better    Currently in Pain? No/denies             Treatment: Practiced standing in the shower washing hair with eyes closed: 2 x 30", cues for keeping feet wide to improve balance and having elbow or shoulder touching the wall to improve stabilization  Floor transfers: all SBA - using mat table: 1x - using 6" stool: 1x - without support surface: 3x  Ambulated through 20 feet of pine needles, walked on very steep grassy hill and came down steep grassy hill: 20 feet. Ambulated on gradual grassy hill downhill: 60 feet  FOTO: initial 51% FOTO: discharge: 80%                          PT Short Term Goals - 12/03/19 1358      PT SHORT TERM GOAL #1   Title Patient will be able to ambulate 20 min without AD and without significant gait deviations or SOB to improve walking endurance.    Baseline 5 min (eval), 30 min (11/26/19)    Time 4    Period Weeks    Status Achieved    Target Date 12/07/19      PT SHORT TERM GOAL #2   Title Pt will be able to carry 10-15 lb weight in hand  to be able to carry grocery bags from car to house, about 200 feet    Baseline sister helping with groceries, able to carry 10lb KB 220' and up and down 12 steps with use of one rail    Time 4    Period Weeks    Status Achieved    Target Date 12/07/19             PT Long Term Goals - 12/03/19 1358      PT LONG TERM GOAL #1   Title Patient will be able to carry a laundry basket up stairs to her room on the stairs I to return to PLOF    Baseline Sister is helping out with ADLs., pt able to carry basket up down stairs now with SBA    Time 8    Period Weeks    Status Achieved      PT LONG TERM GOAL #2   Title Pt will be able to ambulate for 30 min independently without any significant gait deviations to return to PLOF     Baseline 5 min (eval), 30 min(11/26/19)    Time 8    Period Weeks    Status Achieved      PT LONG TERM GOAL #3   Title Pt will be I and compliant with HEP to self manage her symptoms    Baseline Not perfomring exercises, compliant with HEP (12/03/19)    Time 8    Period Weeks    Status Achieved                 Plan - 12/03/19 1322    Clinical Impression Statement Patient has been seen for total of 5 sessions from 11/09/19 to 12/03/19. Patient has progressed well and demonstrated significant improvement towards her shor term and long term functional goals. Patient has met all of her functional goals and she is independent with HEP. Patient does not need skilled PT services at this time and will be discharged from skilled PT.    Personal Factors and Comorbidities Comorbidity 2    Comorbidities HTN, CKD stage III    Examination-Activity Limitations --    Examination-Participation Restrictions Driving    Stability/Clinical Decision Making Evolving/Moderate complexity    Rehab Potential Good    PT Frequency --    PT Duration --    PT Treatment/Interventions Patient/family education    PT Next Visit Plan Discharged    PT Home Exercise Plan Piriformis stretch; walking program (goal 30 min /day in 5 weeks)    Consulted and Agree with Plan of Care Patient    Family Member Consulted sister (patricia)           Patient will benefit from skilled therapeutic intervention in order to improve the following deficits and impairments:     Visit Diagnosis: Muscle weakness (generalized)  Other abnormalities of gait and mobility  Unsteadiness on feet     Problem List Patient Active Problem List   Diagnosis Date Noted  . Acute cerebrovascular accident (CVA) ([place]) 10/26/2019  . Chronic kidney disease, stage III (moderate)   . HFrEF (heart failure with reduced ejection fraction) ([place]) 08/05/2019  . Essential hypertension 07/07/2019  . Nonspecific abnormal electrocardiogram (ECG)  (EKG) 07/07/2019    Kerrie Pleasure 12/03/2019, 2:03 PM  [facility] [address] [place], [place], [address] Phone: [phone]   Fax:  [phone]  Name: Elenor Wildes MRN: [id number] Date of Birth: [birth year]

## 2019-12-06 NOTE — Progress Notes (Signed)
I agree with the above plan 

## 2019-12-07 ENCOUNTER — Other Ambulatory Visit (HOSPITAL_COMMUNITY): Payer: Self-pay | Admitting: Interventional Radiology

## 2019-12-07 DIAGNOSIS — I671 Cerebral aneurysm, nonruptured: Secondary | ICD-10-CM

## 2019-12-08 ENCOUNTER — Other Ambulatory Visit: Payer: Self-pay | Admitting: *Deleted

## 2019-12-08 ENCOUNTER — Other Ambulatory Visit: Payer: Self-pay

## 2019-12-08 ENCOUNTER — Ambulatory Visit: Payer: No Typology Code available for payment source | Admitting: Occupational Therapy

## 2019-12-08 DIAGNOSIS — R41842 Visuospatial deficit: Secondary | ICD-10-CM

## 2019-12-08 DIAGNOSIS — M6281 Muscle weakness (generalized): Secondary | ICD-10-CM | POA: Diagnosis not present

## 2019-12-08 DIAGNOSIS — M25612 Stiffness of left shoulder, not elsewhere classified: Secondary | ICD-10-CM

## 2019-12-08 DIAGNOSIS — R2681 Unsteadiness on feet: Secondary | ICD-10-CM

## 2019-12-08 DIAGNOSIS — R41840 Attention and concentration deficit: Secondary | ICD-10-CM

## 2019-12-08 NOTE — Therapy (Signed)
[facility] [address] [place], [place], [address] Phone: [phone]   Fax:  [phone]  Occupational Therapy Treatment  Patient Details  Name: Kendra Richardson MRN: [id number] Date of Birth: [birth year] Referring Provider (OT): Dr. Tenny Craw, PCP (Dr. Dina Rich- hospitalist)   Encounter Date: 12/08/2019   OT End of Session - 12/08/19 1025    Visit Number 7    Number of Visits 25    Date for OT Re-Evaluation 02/08/20    Authorization Type UHC    Authorization Time Period 60 visit limit, each discipline counts as 1 visit, PT at ortho clinic used 10 visits--may need to decrease OT frequency    Authorization - Visit Number 7    Authorization - Number of Visits 20    OT Start Time 1022    OT Stop Time 1102    OT Time Calculation (min) 40 min    Activity Tolerance Patient tolerated treatment well    Behavior During Therapy [facility] for tasks assessed/performed           Past Medical History:  Diagnosis Date  . Abnormal radiographic examination   . CHF (congestive heart failure) ([facility])   . Chronic kidney disease, stage III (moderate)   . Chronic kidney disease, stage III (moderate)   . CN (constipation)   . Essential hypertension, malignant   . HTN (hypertension)   . LVH (left ventricular hypertrophy)   . Protein-calorie malnutrition ([facility])     Past Surgical History:  Procedure Laterality Date  . BREAST EXCISIONAL BIOPSY Left     There were no vitals filed for this visit.   Subjective Assessment - 12/08/19 1025    Subjective  drove around in the neighborhood Saturday with sister (cleared for driving), planted flowers in pots.    Pertinent History Kendra Richardson is a 64 y.o. female systolic heart failure, CKD 3, hypertension, seen in ED 10/26/19 for evaluation of a syncopal episode. Pt fell and lost consciousness, sustained L eye contusion. Imaging revealed 2 acute L frontal infarcts, CT showed  3.5 mm aneurysm.Pt is seeing interventional radiology for possible procedure in the future.  PMH: HTN CKD Stage III, CHF, left forzen shoulder per pt report. PTA, pt lived alone and was independent, including driving.    Patient Stated Goals to get back to taking care of her home and self    Currently in Pain? No/denies              Supine, gentle joint mobs to L shoulder for stretch.  Followed by Barbaraann Boys with foam noodle BUEs for shoulder flex, chest press, low-range and high range ER, abduction.     Shoulder flex AAROM along wall with ball (BUEs) with min cueing for posture/decr compensation  Wall push-ups with min cueing    Arm bike x5 min level 3 for reciprocal movement (forward/backwards without rest)  Quadruped, cat/cow positions, forward/backward wt. Shifts, and wt. Bearing in prone on elbows with head/chest lift incr shoulder stability/mobility with min cueing.  Sitting, wt. Bearing through L hand in abduction on mat with body on arm movements for incr shoulder stability.        OT Short Term Goals - 12/03/19 1239      OT SHORT TERM GOAL #1   Title I with inital HEP.    Time 6    Period Weeks    Status Achieved    Target Date 12/25/19      OT SHORT TERM GOAL #2  Title Pt will demonstrate 110 shoulder flexion in prep for functional reach    Baseline 100    Time 6    Period Weeks    Status Achieved   12/03/19:  110*     OT SHORT TERM GOAL #3   Title Pt will verbalize understanding of compensatory strategies for visual impairments.    Time 6    Period Weeks    Status Deferred   12/03/19:  100% accuarcy with environmental scanning in busy environment, no complaints of diplopia or dizzness.     OT SHORT TERM GOAL #4   Title Pt will perfrom basic home managment/ cooking with supervision demonstrating good safety awareness.    Time 6    Period Weeks    Status Achieved   12/03/19:  per pt report     OT SHORT TERM GOAL #5   Title Further assess cognition in a  functional context and set goals as needed.    Time 6    Period Weeks    Status Deferred   12/03/19:  appears WFL for functional tasks in clinic            OT Long Term Goals - 12/08/19 1030      OT LONG TERM GOAL #1   Title I with updated HEP    Time 12    Period Weeks    Status New      OT LONG TERM GOAL #2   Title Pt will retrieve a lightweight object at 115 shoulder flexion with LUE.    Baseline 100    Time 12    Period Weeks    Status New      OT LONG TERM GOAL #3   Title Pt will perform mod complex home managment/ cooking modified independently.    Time 12    Period Weeks    Status New      OT LONG TERM GOAL #4   Title Pt will demonstrate ability to perform a physical and cogntive task simultaneously with 90% or better accuracy in prep for driving.    Time 12    Period Weeks    Status New      OT LONG TERM GOAL #5   Title Pt will navigate a busy environment and locate itmes with 90% or better accuracy and no reports of double vison.    Time 12    Period Weeks    Status Achieved   12/03/19:  100% with no reports of diplopia     OT LONG TERM GOAL #6   Title Pt will resume planting flowers modified independently.    Time 12    Period Weeks    Status New                 Plan - 12/08/19 1057    Clinical Impression Statement Pt is progressing towards goals with incr LUE strength and functional use with no pain.  Pt was able to use BUEs to lift potting soil and plant flowers in pots.    OT Occupational Profile and History Detailed Assessment- Review of Records and additional review of physical, cognitive, psychosocial history related to current functional performance    Occupational performance deficits (Please refer to evaluation for details): ADL's;IADL's;Leisure;Social Participation    Body Structure / Function / Physical Skills ADL;Endurance;UE functional use;Balance;Flexibility;Pain;FMC;ROM;Coordination;Decreased knowledge of precautions;Decreased  knowledge of use of DME;IADL;Strength;Mobility    Cognitive Skills Attention;Memory;Safety Awareness;Understand;Thought    Rehab Potential Fair    Clinical  Decision Making Several treatment options, min-mod task modification necessary    Comorbidities Affecting Occupational Performance: May have comorbidities impacting occupational performance    Modification or Assistance to Complete Evaluation  No modification of tasks or assist necessary to complete eval    OT Frequency 2x / week    OT Duration 12 weeks    OT Treatment/Interventions Self-care/ADL training;Ultrasound;Energy conservation;Visual/perceptual remediation/compensation;Patient/family education;Aquatic Therapy;DME and/or AE instruction;Balance training;Passive range of motion;Paraffin;Cryotherapy;Fluidtherapy;Splinting;Functional Mobility Training;Electrical Stimulation;Moist Heat;Therapeutic exercise;Manual Therapy;Therapeutic activities;Cognitive remediation/compensation;Neuromuscular education    Plan continue with neuro re-ed/ROM L shoulder; update HEP prn, divided attention    Consulted and Agree with Plan of Care Patient           Patient will benefit from skilled therapeutic intervention in order to improve the following deficits and impairments:   Body Structure / Function / Physical Skills: ADL, Endurance, UE functional use, Balance, Flexibility, Pain, FMC, ROM, Coordination, Decreased knowledge of precautions, Decreased knowledge of use of DME, IADL, Strength, Mobility Cognitive Skills: Attention, Memory, Safety Awareness, Understand, Thought     Visit Diagnosis: Muscle weakness (generalized)  Unsteadiness on feet  Visuospatial deficit  Stiffness of left shoulder, not elsewhere classified  Attention and concentration deficit    Problem List Patient Active Problem List   Diagnosis Date Noted  . Acute cerebrovascular accident (CVA) ([place]) 10/26/2019  . Chronic kidney disease, stage III (moderate)   . HFrEF  (heart failure with reduced ejection fraction) ([place]) 08/05/2019  . Essential hypertension 07/07/2019  . Nonspecific abnormal electrocardiogram (ECG) (EKG) 07/07/2019    [facility] 12/08/2019, 11:10 AM  [facility] [address] [place], [place], [address] Phone: [phone]   Fax:  [phone]  Name: Kendra Richardson MRN: [id number] Date of Birth: Sep 26, 1955   Vianne Bulls, OTR/L [facility] [address] [place], [place]  [address] [phone] phone [phone] 12/08/19 11:10 AM

## 2019-12-08 NOTE — Patient Outreach (Signed)
[facility]) Care Management  12/08/2019  Kendra Richardson October 20, 1955 [id number]   Subjective: Telephone call to patient's home number, spoke with patient, and HIPAA verified.  Patient states she is doing very well, no headaches, has completed outpatient physical therapy, and will have a few more occupational therapy visit through mid July 2021.  States she had a follow up appointment with neurologist provider on 12/01/2019 and visit went well.   States her procedure with interventional radiologist for aneurysm is scheduled for 12/23/2019.  Patient states she does not have any education material, EMMI follow up, care coordination, care management, disease monitoring, transportation, community resource, or pharmacy needs at this time. States she is very appreciative of the follow up, is aware 30 day EMMI automated call are completed,  is in agreement  to receive 1 additional follow up call to assess for further CM needs, after 7/1 procedure.   States she has this RNCM's and [facility] Care Management contact information, will call if assistance needed prior to next patent outreach.      Objective:Per KPN (Knowledge Performance Now, point of care tool) and chart review,patient hospitalized 10/26/2019 - 10/28/2019 for acute CVA,Acute thrombotic stroke, left ACA territory infarction. Patient also has a history of chronic systolic heart failure,hypertension,andCKD stage IIIa.    Assessment:Received Medicare EMMI Stroke Red Flag Alert follow up referral on 11/01/2019. Red Flag Alert Triggers, Day #1, times 2, patient answered no to the following question: Scheduled a follow-up appointment?Patient answered yes to the following question:Problems setting up rehab?EMMI follow up completed and will follow up to assess further care management needs.      Plan:RNCM will call patient for telephone outreach attempt, within21business days, to assess for further CM needs,  and proceed with case closure, within 10 business days if no return call, after4thunsuccessful outreach call.    Joyice Magda H. Gardiner Barefoot, BSN, CCM [facility] Care Management [facility] Telephonic CM Phone: [phone] Fax: [phone]

## 2019-12-09 ENCOUNTER — Other Ambulatory Visit: Payer: Self-pay | Admitting: Medical

## 2019-12-09 DIAGNOSIS — R42 Dizziness and giddiness: Secondary | ICD-10-CM

## 2019-12-09 DIAGNOSIS — I639 Cerebral infarction, unspecified: Secondary | ICD-10-CM

## 2019-12-15 ENCOUNTER — Ambulatory Visit: Payer: No Typology Code available for payment source | Admitting: Occupational Therapy

## 2019-12-15 ENCOUNTER — Other Ambulatory Visit: Payer: Self-pay

## 2019-12-15 DIAGNOSIS — R41840 Attention and concentration deficit: Secondary | ICD-10-CM

## 2019-12-15 DIAGNOSIS — M6281 Muscle weakness (generalized): Secondary | ICD-10-CM

## 2019-12-15 DIAGNOSIS — M25612 Stiffness of left shoulder, not elsewhere classified: Secondary | ICD-10-CM

## 2019-12-15 NOTE — Therapy (Signed)
[facility] [address] [place], [place], [address] Phone: [phone]   Fax:  [phone]  Occupational Therapy Treatment  Patient Details  Name: Kendra Richardson MRN: [id number] Date of Birth: [birth year] Referring Provider (OT): Dr. Harrington Challenger, PCP (Dr. Nigel Bridgeman- hospitalist)   Encounter Date: 12/15/2019   OT End of Session - 12/15/19 1609    Visit Number 8    Number of Visits 25    Date for OT Re-Evaluation 02/08/20    Authorization Type UHC    Authorization Time Period 60 visit limit, each discipline counts as 1 visit, PT at ortho clinic used 10 visits--may need to decrease OT frequency    Authorization - Visit Number 8    Authorization - Number of Visits 20    OT Start Time 1533    OT Stop Time 1613    OT Time Calculation (min) 40 min    Activity Tolerance Patient tolerated treatment well    Behavior During Therapy [facility] for tasks assessed/performed           Past Medical History:  Diagnosis Date  . Abnormal radiographic examination   . CHF (congestive heart failure) ([place])   . Chronic kidney disease, stage III (moderate)   . Chronic kidney disease, stage III (moderate)   . CN (constipation)   . Essential hypertension, malignant   . HTN (hypertension)   . LVH (left ventricular hypertrophy)   . Protein-calorie malnutrition ([place])     Past Surgical History:  Procedure Laterality Date  . BREAST EXCISIONAL BIOPSY Left     There were no vitals filed for this visit.   Subjective Assessment - 12/15/19 1608    Subjective  Pt reports she is having procedure on 7/1    Pertinent History Kendra Richardson is a 64 y.o. female systolic heart failure, CKD 3, hypertension, seen in ED 10/26/19 for evaluation of a syncopal episode. Pt fell and lost consciousness, sustained L eye contusion. 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  PMH: HTN CKD Stage III, CHF, left forzen shoulder per pt report. PTA, pt lived alone and was independent, including driving.    Patient Stated Goals to get back to taking care of her home and self    Currently in Pain? No/denies                 Treatment:Pt reports that she is having procedure next week 7/1 to address aneurysm. Pt may have clipping. Pt will call to cx her 7/7 appt if she has restrictions from procedure. Supine left shoulder mobs and scapular mobs followed by chest press, shoulder flexion and scapular  Retraction/ protraction with pool noodle, min facilitation . Seated closed chain shoulder flexion and internal/ external rotation, min facilitation Standing to perform mid- high range shoulder flexion while stepping in diagonal pattern with UE ranger, and circumduction, min facilitation/ v.c Arm bike x 8 mins level 3 for reciprocal movement.                 OT Short Term Goals - 12/03/19 1239      OT SHORT TERM GOAL #1   Title I with inital HEP.    Time 6    Period Weeks    Status Achieved    Target Date 12/25/19      OT SHORT TERM GOAL #2   Title Pt will demonstrate 110 shoulder flexion  in prep for functional reach    Baseline 100    Time 6    Period Weeks    Status Achieved   12/03/19:  110*     OT SHORT TERM GOAL #3   Title Pt will verbalize understanding of compensatory strategies for visual impairments.    Time 6    Period Weeks    Status Deferred   12/03/19:  100% accuarcy with environmental scanning in busy environment, no complaints of diplopia or dizzness.     OT SHORT TERM GOAL #4   Title Pt will perfrom basic home managment/ cooking with supervision demonstrating good safety awareness.    Time 6    Period Weeks    Status Achieved   12/03/19:  per pt report     OT SHORT TERM GOAL #5   Title Further assess cognition in a functional context and set goals as needed.    Time 6    Period Weeks     Status Deferred   12/03/19:  appears WFL for functional tasks in clinic            OT Long Term Goals - 12/15/19 1611      OT LONG TERM GOAL #1   Title I with updated HEP    Time 12    Period Weeks    Status On-going      OT LONG TERM GOAL #2   Title Pt will retrieve a lightweight object at 115 shoulder flexion with LUE.    Baseline 100    Time 12    Period Weeks    Status On-going      OT LONG TERM GOAL #3   Title Pt will perform mod complex home managment/ cooking modified independently.    Time 12    Period Weeks    Status On-going   Pt reports she cooked a full meal with supervision     OT LONG TERM GOAL #4   Title Pt will demonstrate ability to perform a physical and cogntive task simultaneously with 90% or better accuracy in prep for driving.    Time 12    Period Weeks    Status On-going      OT LONG TERM GOAL #5   Title Pt will navigate a busy environment and locate itmes with 90% or better accuracy and no reports of double vison.    Time 12    Period Weeks    Status On-going   12/03/19:  100% with no reports of diplopia     OT LONG TERM GOAL #6   Title Pt will resume planting flowers modified independently.    Time 12    Period Weeks    Status Achieved                 Plan - 12/15/19 1610    Clinical Impression Statement Pt is progressing towards goals. she reports cooking, driving and using LUE more at home.    OT Occupational Profile and History Detailed Assessment- Review of Records and additional review of physical, cognitive, psychosocial history related to current functional performance    Occupational performance deficits (Please refer to evaluation for details): ADL's;IADL's;Leisure;Social Participation    Body Structure / Function / Physical Skills ADL;Endurance;UE functional use;Balance;Flexibility;Pain;FMC;ROM;Coordination;Decreased knowledge of precautions;Decreased knowledge of use of DME;IADL;Strength;Mobility    Cognitive Skills  Attention;Memory;Safety Awareness;Understand;Thought    Rehab Potential Fair    Clinical Decision Making Several treatment options, min-mod task modification necessary    Comorbidities  Affecting Occupational Performance: May have comorbidities impacting occupational performance    Modification or Assistance to Complete Evaluation  No modification of tasks or assist necessary to complete eval    OT Frequency 2x / week    OT Duration 12 weeks    OT Treatment/Interventions Self-care/ADL training;Ultrasound;Energy conservation;Visual/perceptual remediation/compensation;Patient/family education;Aquatic Therapy;DME and/or AE instruction;Balance training;Passive range of motion;Paraffin;Cryotherapy;Fluidtherapy;Splinting;Functional Mobility Training;Electrical Stimulation;Moist Heat;Therapeutic exercise;Manual Therapy;Therapeutic activities;Cognitive remediation/compensation;Neuromuscular education    Plan continue with neuro re-ed/ROM L shoulder; update HEP prn, divided attention    Consulted and Agree with Plan of Care Patient           Patient will benefit from skilled therapeutic intervention in order to improve the following deficits and impairments:   Body Structure / Function / Physical Skills: ADL, Endurance, UE functional use, Balance, Flexibility, Pain, FMC, ROM, Coordination, Decreased knowledge of precautions, Decreased knowledge of use of DME, IADL, Strength, Mobility Cognitive Skills: Attention, Memory, Safety Awareness, Understand, Thought     Visit Diagnosis: Muscle weakness (generalized)  Attention and concentration deficit  Stiffness of left shoulder, not elsewhere classified    Problem List Patient Active Problem List   Diagnosis Date Noted  . Acute cerebrovascular accident (CVA) (HCC) 10/26/2019  . Chronic kidney disease, stage III (moderate)   . HFrEF (heart failure with reduced ejection fraction) (HCC) 08/05/2019  . Essential hypertension 07/07/2019  . Nonspecific  abnormal electrocardiogram (ECG) (EKG) 07/07/2019    Kendra Richardson 12/15/2019, 4:12 PM  [facility] [address] [place], [place], [address] Phone: [phone]   Fax:  [phone]  Name: Kendra Richardson MRN: [id number] Date of Birth: 1956/05/13

## 2019-12-16 ENCOUNTER — Ambulatory Visit: Payer: No Typology Code available for payment source

## 2019-12-20 ENCOUNTER — Ambulatory Visit: Payer: No Typology Code available for payment source

## 2019-12-20 ENCOUNTER — Ambulatory Visit: Payer: No Typology Code available for payment source | Admitting: Occupational Therapy

## 2019-12-20 ENCOUNTER — Other Ambulatory Visit: Payer: Self-pay

## 2019-12-20 DIAGNOSIS — M25612 Stiffness of left shoulder, not elsewhere classified: Secondary | ICD-10-CM

## 2019-12-20 DIAGNOSIS — R41840 Attention and concentration deficit: Secondary | ICD-10-CM

## 2019-12-20 DIAGNOSIS — M6281 Muscle weakness (generalized): Secondary | ICD-10-CM | POA: Diagnosis not present

## 2019-12-20 NOTE — Therapy (Signed)
[facility] [address] [place], [place], [address] Phone: [phone]   Fax:  [phone]  Occupational Therapy Treatment  Patient Details  Name: Kendra Richardson MRN: [id number] Date of Birth: Dec 16, 1955 Referring Provider (OT): Dr. Harrington Challenger, PCP (Dr. Nigel Bridgeman- hospitalist)   Encounter Date: 12/20/2019   OT End of Session - 12/20/19 1408    Visit Number 9    Number of Visits 25    Date for OT Re-Evaluation 02/08/20    Authorization Type UHC    Authorization - Visit Number 9    Authorization - Number of Visits 20    OT Start Time 3419    OT Stop Time 1400    OT Time Calculation (min) 45 min    Activity Tolerance Patient tolerated treatment well    Behavior During Therapy [facility] for tasks assessed/performed           Past Medical History:  Diagnosis Date  . Abnormal radiographic examination   . CHF (congestive heart failure) ([place])   . Chronic kidney disease, stage III (moderate)   . Chronic kidney disease, stage III (moderate)   . CN (constipation)   . Essential hypertension, malignant   . HTN (hypertension)   . LVH (left ventricular hypertrophy)   . Protein-calorie malnutrition ([place])     Past Surgical History:  Procedure Laterality Date  . BREAST EXCISIONAL BIOPSY Left     There were no vitals filed for this visit.   Subjective Assessment - 12/20/19 1408    Subjective  Pt reports she is having procedure on 7/1    Pertinent History Kendra Richardson is a 64 y.o. female systolic heart failure, CKD 3, hypertension, seen in ED 10/26/19 for evaluation of a syncopal episode. Pt fell and lost consciousness, sustained L eye contusion. 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  PMH: HTN CKD Stage III, CHF, left forzen shoulder per pt report. PTA, pt lived alone and was independent, including  driving.    Patient Stated Goals to get back to taking care of her home and self    Currently in Pain? No/denies            Therapist recommended getting resume orders for therapy (and date she can resume therapy) after procedure scheduled for 12/23/19. Also discussed clarifying any precautions, restrictions and for how long following procedure.  Supine: soft tissue mobs, joint mobs Lt shoulder followed by gentle P/ROM in flex, horizontal abd/add, ER/IR. Pt then progressed to A/ROM in bilateral high level sh flex/ext, horizontal abd/add, and ER/IR with min to mod facilitation LUE.  Divided attention task b/t physical task (tossing ball) and cognitive task (mental subtractions and category generation) with no difficulty                      OT Short Term Goals - 12/03/19 1239      OT SHORT TERM GOAL #1   Title I with inital HEP.    Time 6    Period Weeks    Status Achieved    Target Date 12/25/19      OT SHORT TERM GOAL #2   Title Pt will demonstrate 110 shoulder flexion in prep for functional reach    Baseline 100    Time 6    Period Weeks    Status Achieved   12/03/19:  110*     OT  SHORT TERM GOAL #3   Title Pt will verbalize understanding of compensatory strategies for visual impairments.    Time 6    Period Weeks    Status Deferred   12/03/19:  100% accuarcy with environmental scanning in busy environment, no complaints of diplopia or dizzness.     OT SHORT TERM GOAL #4   Title Pt will perfrom basic home managment/ cooking with supervision demonstrating good safety awareness.    Time 6    Period Weeks    Status Achieved   12/03/19:  per pt report     OT SHORT TERM GOAL #5   Title Further assess cognition in a functional context and set goals as needed.    Time 6    Period Weeks    Status Deferred   12/03/19:  appears WFL for functional tasks in clinic            OT Long Term Goals - 12/15/19 1611      OT LONG TERM GOAL #1   Title I with updated  HEP    Time 12    Period Weeks    Status On-going      OT LONG TERM GOAL #2   Title Pt will retrieve a lightweight object at 115 shoulder flexion with LUE.    Baseline 100    Time 12    Period Weeks    Status On-going      OT LONG TERM GOAL #3   Title Pt will perform mod complex home managment/ cooking modified independently.    Time 12    Period Weeks    Status On-going   Pt reports she cooked a full meal with supervision     OT LONG TERM GOAL #4   Title Pt will demonstrate ability to perform a physical and cogntive task simultaneously with 90% or better accuracy in prep for driving.    Time 12    Period Weeks    Status On-going      OT LONG TERM GOAL #5   Title Pt will navigate a busy environment and locate itmes with 90% or better accuracy and no reports of double vison.    Time 12    Period Weeks    Status On-going   12/03/19:  100% with no reports of diplopia     OT LONG TERM GOAL #6   Title Pt will resume planting flowers modified independently.    Time 12    Period Weeks    Status Achieved                 Plan - 12/20/19 1410    Clinical Impression Statement Pt is progressing towards goals. she reports cooking, driving and using LUE more at home.    Occupational performance deficits (Please refer to evaluation for details): ADL's;IADL's;Leisure;Social Participation    Body Structure / Function / Physical Skills ADL;Endurance;UE functional use;Balance;Flexibility;Pain;FMC;ROM;Coordination;Decreased knowledge of precautions;Decreased knowledge of use of DME;IADL;Strength;Mobility    Cognitive Skills Attention;Memory;Safety Awareness;Understand;Thought    OT Frequency 2x / week    OT Duration 12 weeks    OT Treatment/Interventions Self-care/ADL training;Ultrasound;Energy conservation;Visual/perceptual remediation/compensation;Patient/family education;Aquatic Therapy;DME and/or AE instruction;Balance training;Passive range of  motion;Paraffin;Cryotherapy;Fluidtherapy;Splinting;Functional Mobility Training;Electrical Stimulation;Moist Heat;Therapeutic exercise;Manual Therapy;Therapeutic activities;Cognitive remediation/compensation;Neuromuscular education    Plan Pt scheduled for procedure 12/23/19 and will call to cancel 12/29/19 OT appt if needed, instructed to get resume orders and clarify any precautions/restrictions and how long if applicable    Consulted and Agree with Plan of  Care Patient           Patient will benefit from skilled therapeutic intervention in order to improve the following deficits and impairments:   Body Structure / Function / Physical Skills: ADL, Endurance, UE functional use, Balance, Flexibility, Pain, FMC, ROM, Coordination, Decreased knowledge of precautions, Decreased knowledge of use of DME, IADL, Strength, Mobility Cognitive Skills: Attention, Memory, Safety Awareness, Understand, Thought     Visit Diagnosis: Stiffness of left shoulder, not elsewhere classified  Muscle weakness (generalized)  Attention and concentration deficit    Problem List Patient Active Problem List   Diagnosis Date Noted  . Acute cerebrovascular accident (CVA) (HCC) 10/26/2019  . Chronic kidney disease, stage III (moderate)   . HFrEF (heart failure with reduced ejection fraction) (HCC) 08/05/2019  . Essential hypertension 07/07/2019  . Nonspecific abnormal electrocardiogram (ECG) (EKG) 07/07/2019    Kelli Churn 12/20/2019, 2:11 PM  [facility] [address] [place], [place], [address] Phone: [phone]   Fax:  [phone]  Name: Kendra Richardson MRN: [id number] Date of Birth: 1955/11/28

## 2019-12-22 ENCOUNTER — Other Ambulatory Visit: Payer: Self-pay | Admitting: Student

## 2019-12-23 ENCOUNTER — Other Ambulatory Visit (HOSPITAL_COMMUNITY): Payer: Self-pay | Admitting: Interventional Radiology

## 2019-12-23 ENCOUNTER — Ambulatory Visit: Payer: No Typology Code available for payment source | Admitting: Occupational Therapy

## 2019-12-23 ENCOUNTER — Other Ambulatory Visit: Payer: Self-pay

## 2019-12-23 ENCOUNTER — Ambulatory Visit (HOSPITAL_COMMUNITY)
Admission: RE | Admit: 2019-12-23 | Discharge: 2019-12-23 | Disposition: A | Discharge status: Home / Self Care | Payer: No Typology Code available for payment source | Source: Ambulatory Visit | Attending: Interventional Radiology | Admitting: Interventional Radiology

## 2019-12-23 DIAGNOSIS — I130 Hypertensive heart and chronic kidney disease with heart failure and stage 1 through stage 4 chronic kidney disease, or unspecified chronic kidney disease: Secondary | ICD-10-CM | POA: Insufficient documentation

## 2019-12-23 DIAGNOSIS — I671 Cerebral aneurysm, nonruptured: Secondary | ICD-10-CM | POA: Diagnosis present

## 2019-12-23 DIAGNOSIS — Z7902 Long term (current) use of antithrombotics/antiplatelets: Secondary | ICD-10-CM | POA: Insufficient documentation

## 2019-12-23 DIAGNOSIS — Z7982 Long term (current) use of aspirin: Secondary | ICD-10-CM | POA: Diagnosis not present

## 2019-12-23 DIAGNOSIS — N183 Chronic kidney disease, stage 3 unspecified: Secondary | ICD-10-CM | POA: Diagnosis not present

## 2019-12-23 DIAGNOSIS — I509 Heart failure, unspecified: Secondary | ICD-10-CM | POA: Diagnosis not present

## 2019-12-23 DIAGNOSIS — Z79899 Other long term (current) drug therapy: Secondary | ICD-10-CM | POA: Diagnosis not present

## 2019-12-23 HISTORY — PX: IR ANGIO INTRA EXTRACRAN SEL COM CAROTID INNOMINATE BILAT MOD SED: IMG5360

## 2019-12-23 HISTORY — PX: IR ANGIO VERTEBRAL SEL VERTEBRAL UNI R MOD SED: IMG5368

## 2019-12-23 HISTORY — PX: IR 3D INDEPENDENT WKST: IMG2385

## 2019-12-23 HISTORY — PX: IR US GUIDE VASC ACCESS RIGHT: IMG2390

## 2019-12-23 HISTORY — PX: IR ANGIO VERTEBRAL SEL SUBCLAVIAN INNOMINATE UNI L MOD SED: IMG5364

## 2019-12-23 LAB — CBC WITH DIFFERENTIAL/PLATELET
Abs Immature Granulocytes: 0.01 10*3/uL (ref 0.00–0.07)
Basophils Absolute: 0 10*3/uL (ref 0.0–0.1)
Basophils Relative: 1 %
Eosinophils Absolute: 0.1 10*3/uL (ref 0.0–0.5)
Eosinophils Relative: 3 %
HCT: 40.5 % (ref 36.0–46.0)
Hemoglobin: 12.9 g/dL (ref 12.0–15.0)
Immature Granulocytes: 0 %
Lymphocytes Relative: 34 %
Lymphs Abs: 1.5 10*3/uL (ref 0.7–4.0)
MCH: 27.8 pg (ref 26.0–34.0)
MCHC: 31.9 g/dL (ref 30.0–36.0)
MCV: 87.3 fL (ref 80.0–100.0)
Monocytes Absolute: 0.4 10*3/uL (ref 0.1–1.0)
Monocytes Relative: 10 %
Neutro Abs: 2.3 10*3/uL (ref 1.7–7.7)
Neutrophils Relative %: 52 %
Platelets: 201 10*3/uL (ref 150–400)
RBC: 4.64 MIL/uL (ref 3.87–5.11)
RDW: 12.6 % (ref 11.5–15.5)
WBC: 4.4 10*3/uL (ref 4.0–10.5)
nRBC: 0 % (ref 0.0–0.2)

## 2019-12-23 LAB — PROTIME-INR
INR: 1 (ref 0.8–1.2)
Prothrombin Time: 12.6 seconds (ref 11.4–15.2)

## 2019-12-23 LAB — BASIC METABOLIC PANEL
Anion gap: 9 (ref 5–15)
BUN: 21 mg/dL (ref 8–23)
CO2: 25 mmol/L (ref 22–32)
Calcium: 9.7 mg/dL (ref 8.9–10.3)
Chloride: 106 mmol/L (ref 98–111)
Creatinine, Ser: 1.33 mg/dL — ABNORMAL HIGH (ref 0.44–1.00)
GFR calc Af Amer: 48 mL/min — ABNORMAL LOW (ref >60)
GFR calc non Af Amer: 42 mL/min — ABNORMAL LOW (ref >60)
Glucose, Bld: 110 mg/dL — ABNORMAL HIGH (ref 70–99)
Potassium: 3.6 mmol/L (ref 3.5–5.1)
Sodium: 140 mmol/L (ref 135–145)

## 2019-12-23 LAB — APTT: aPTT: 29 seconds (ref 24–36)

## 2019-12-23 IMAGING — XA IR ANGIO INTRA EXTRACRAN SEL COM CAROTID INNOMINATE BILAT MOD SE
2 of 4 series · 11 of 24 positions shown · IV contrast (IODINE)
Comparison: CT angiogram of the head and neck [DATE] and
MRI of the brain [DATE].

CLINICAL DATA: Patient with history of stroke. Intermittent
headaches. Discovery of a left internal carotid artery posterior
communicating artery region aneurysm.

EXAM:
WORKSTATION 3D RECONSTRUCTION; IR ANGIO VERTEBRAL SEL SUBCLAVIAN
INNOMINATE UNI LEFT MOD SED; IR ANGIO VERTEBRAL SEL VERTEBRAL UNI
RIGHT MOD SED; BILATERAL COMMON CAROTID AND INNOMINATE ANGIOGRAPHY;
IR ULTRASOUND GUIDANCE VASC ACCESS RIGHT
TECHNIQUE: Informed written consent was obtained from the patient after a
thorough discussion of the procedural risks, benefits and
alternatives. All questions were addressed. Maximal Sterile Barrier
Technique was utilized including caps, mask, sterile gowns, sterile
gloves, sterile drape, hand hygiene and skin antiseptic. A timeout
was performed prior to the initiation of the procedure.

[Series 15: cerebral · 1 of 2 slices shown]
[im 1/2]
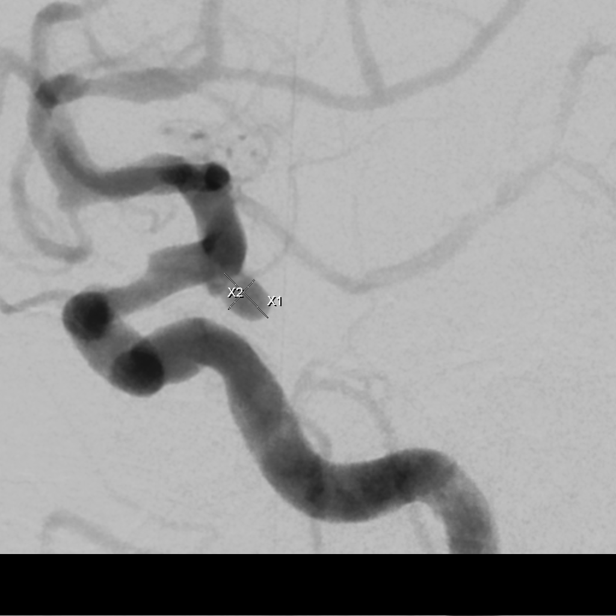

[Series 300: dr. (person_name) · 10 of 303 slices shown]
[im 1/303]
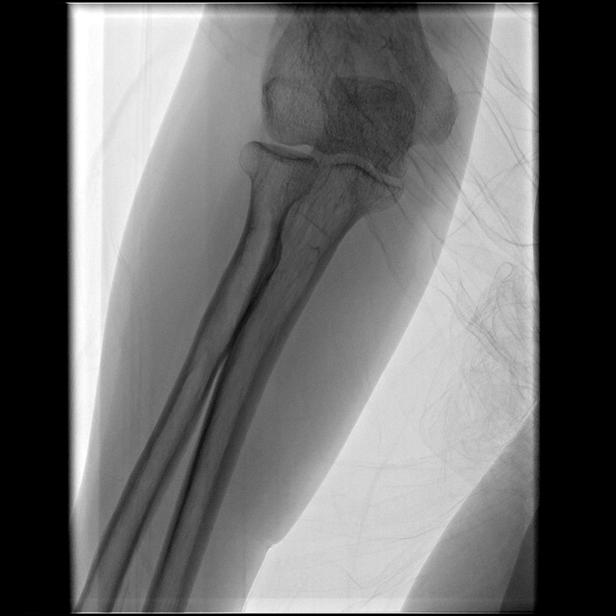
[im 31/303]
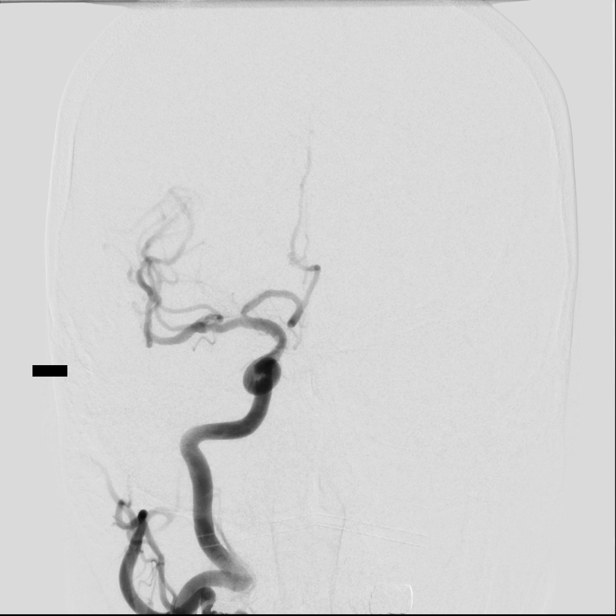
[im 61/303]
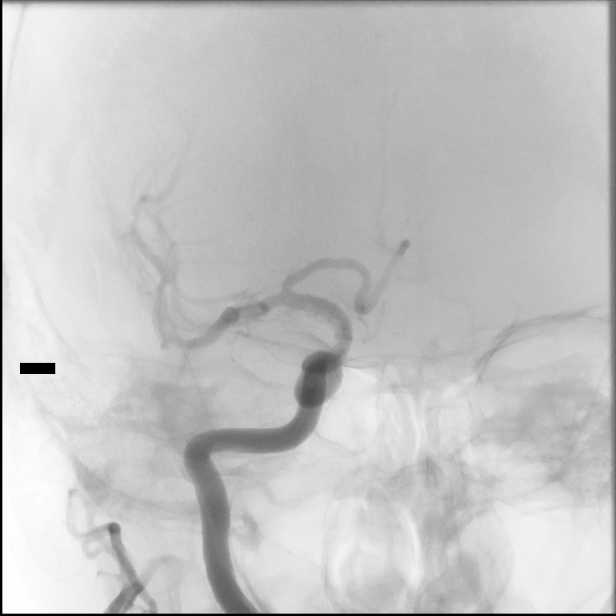
[im 91/303]
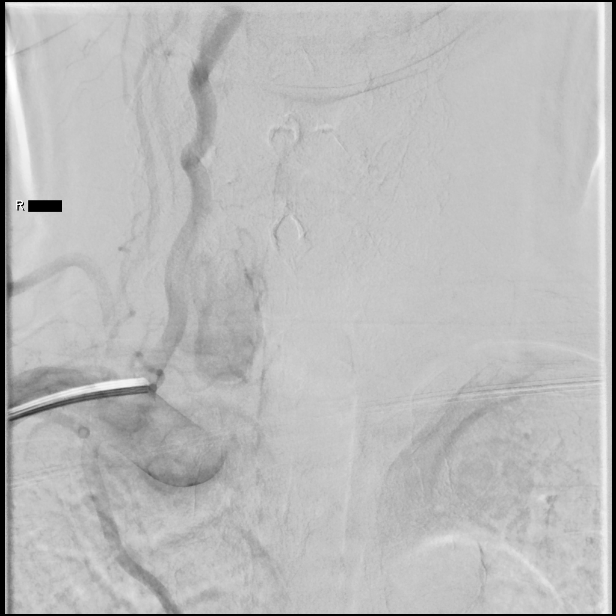
[im 136/303]
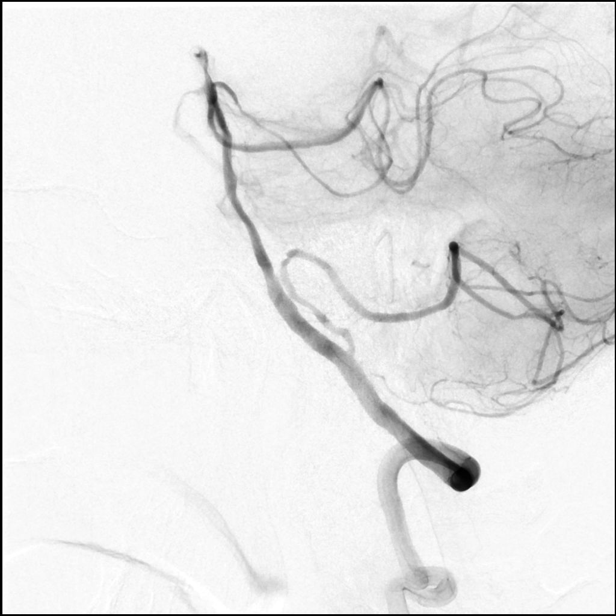
[im 167/303]
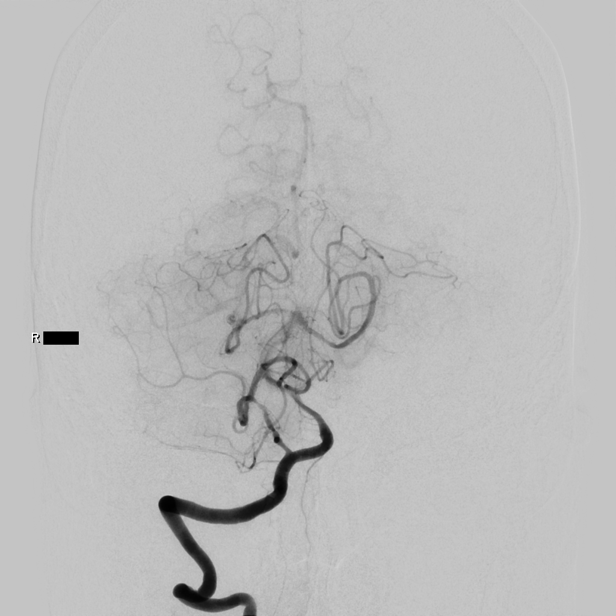
[im 197/303]
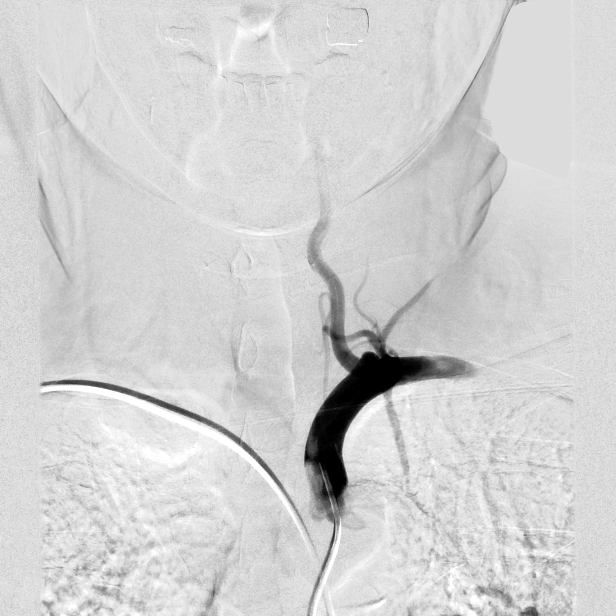
[im 227/303]
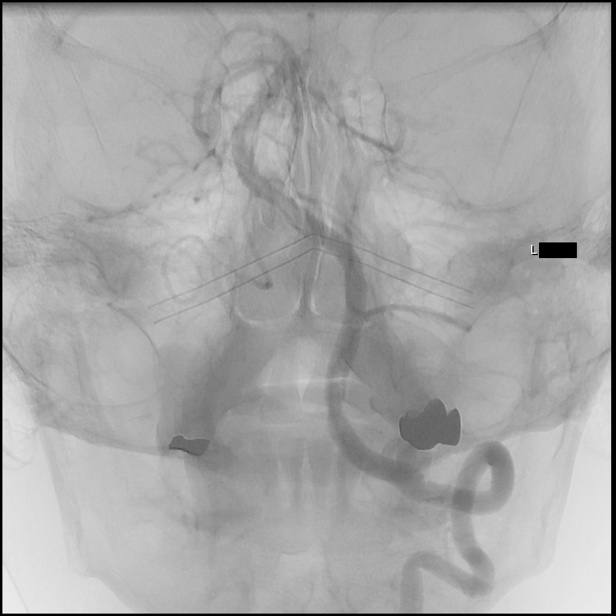
[im 257/303]
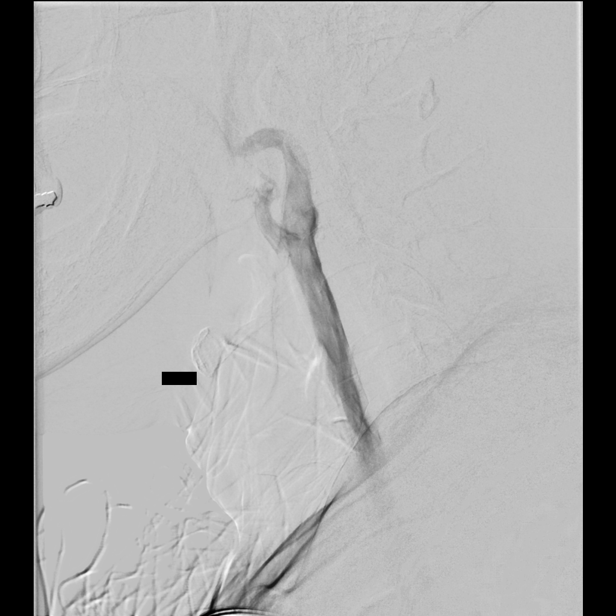
[im 287/303]
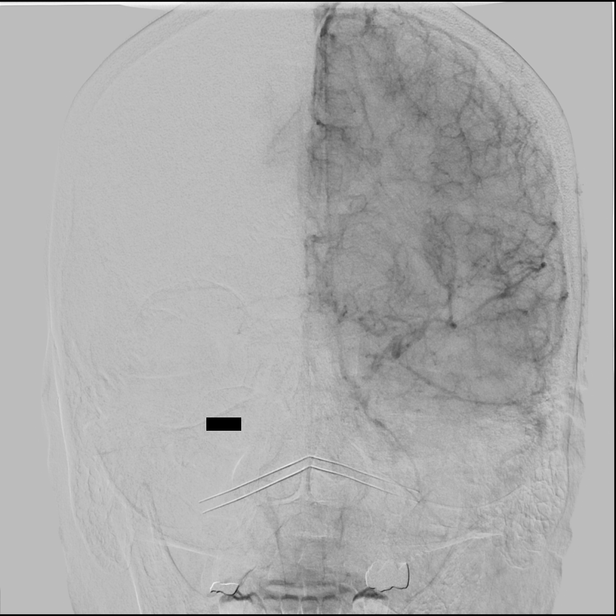

[11 of 24 positions shown; findings below may reference images not displayed]

MEDICATIONS:
Heparin [C4] units IA; no antibiotic was administered within 1 hour
of the procedure.

ANESTHESIA/SEDATION:
Versed 1 mg IV; Fentanyl 25 mcg IV

Moderate Sedation Time:  57 minutes

The patient was continuously monitored during the procedure by the
interventional radiology nurse under my direct supervision.

CONTRAST:  Isovue 300 approximately 65 mL

FLUOROSCOPY TIME:  Fluoroscopy Time: 22 minutes 6 seconds (698 mGy).

COMPLICATIONS:
None immediate.
The right forearm to the wrist was prepped and draped in the usual
sterile manner. The right radial artery morphology was then verified
with ultrasound and documented.

A dorsal palmar anastomosis was verified to be present. Using
ultrasound guidance, transradial access was obtained with a
micropuncture set over a 0.018 inch micro guidewire. A [DATE] French
radial sheath was then inserted without difficulty. The obturator,
and micro guidewire were removed. Good aspiration was obtained from
the radial sheath. A cocktail of [C4] units of heparin, 2.5 mg of
verapamil, and 200 mcg of nitroglycerin was then infused in diluted
form without event.

A right radial arteriogram was also obtained.

Over a 0.035 Roadrunner guidewire, a 5 OLK 2 diagnostic
catheter was advanced to the aortic arch region, and cannulations
were performed of the right vertebral artery, the right common
carotid artery, the left common carotid artery and the left
subclavian arteries.

Following the procedure, right radial puncture hemostasis was
achieved with a wrist band. Distal right radial pulse was verified
to be present.
FINDINGS: The right common carotid arteriogram demonstrates the right external
carotid artery and its major branches to be widely patent.

The right internal carotid artery at the bulb to the cranial skull
base also demonstrates wide patency.

The petrous segment and the proximal cavernous segment are widely
patent.

There is diffuse segmental narrowing of the supraclinoid right ICA
at the level of the ophthalmic artery with approximately 50%
stenosis.

The right middle cerebral artery and the anterior cerebral artery
opacify into the capillary and venous phases. Mild caliber
irregularity is seen of the right pericallosal artery probably also
associated with intracranial arteriosclerosis. The right vertebral
artery origin is widely patent.

The vessel opacifies to the cranial skull base. Wide patency is seen
of the right vertebrobasilar junction and the right
posterior-inferior cerebellar artery. The PICA demonstrates focal
areas of caliber irregularity and mild narrowing in its proximal [DATE]
most consistent with intracranial arteriosclerosis.

The basilar artery, the right posterior cerebral artery, the
superior cerebellar arteries and the anterior-inferior cerebellar
arteries opacify into the capillary and venous phases. Mild caliber
irregularity and narrowing seen of both superior cerebellar
arteries.

Inflow of unopacified blood is seen in the basilar artery from the
contralateral vertebral artery.

The origin of the left vertebral artery is widely patent.

The vessel opacifies to the cranial skull base. Wide patency is seen
of the left vertebrobasilar junction and the left posterior-inferior
cerebellar artery.

The basilar artery, the right posterior cerebral artery, the
superior cerebellar arteries and the anterior-inferior cerebellar
arteries opacify into the capillary and venous phases.

There appears to be delayed filling of the left posterior cerebral
artery.

The petrous and the proximal cavernous segments are widely patent.

The left common carotid arteriogram demonstrates the left external
carotid artery and its major branches to be widely patent.

The left internal carotid artery at the bulb to the cranial skull
base also demonstrates wide patency.

Mild to moderate narrowing is seen of the of the supraclinoid left
ICA proximal to the left posterior communicating artery region
aneurysm. Approximately 5.5 mm x 3.5 mm moderate sized neck mild
lobulated saccular aneurysm arising in the region of the left
posterior communicating artery.

Mild-to-moderate intracranial arteriosclerotic changes involving the
anterior cerebral arteries, the pericallosal artery branches, and
the superior division of the right middle cerebral artery, and the
left saccular lobulated aneurysm is seen arising in the left
posterior communicating artery region. A diminutive vessel is seen
to arise in the region of the neck of the aneurysm.

Just distal to this and also arising from the posterior wall of the
left internal carotid artery is a prominent vessel that projects
posteriorly supplying the distal left PCA distribution probably
representing a dominant anterior choroidal artery. The middle aspect
of this vessel appears to have focal areas of caliber irregularity
also probably representing intracranial arteriosclerosis.

The left middle cerebral artery and the left anterior cerebral
artery opacify into the capillary and venous phases. Again seen are
mild to moderate areas of caliber irregularity involving the left
pericallosal artery, and the M2 peri-insular branches of the
superior division probably representing intracranial
arteriosclerosis.

A 3D rotational arteriogram was performed with reconstruction of the
images on a separate workstation confirming the aneurysm morphology.
IMPRESSION: Approximately 5.5 mm x 3.5 mm moderate sized neck mildly lobulated
saccular aneurysm arising in the left posterior communicating artery
region of the left ICA.

Scattered mild to moderate intracranial arteriosclerotic changes
involving the pericallosal anterior cerebral artery branches, and
the superior division of the left middle cerebral artery, and the
right posterior-inferior cerebellar artery.

Approximately 50% stenosis of the right internal carotid artery and
supraclinoid segment.

PLAN:
Follow-up consult involving the management of the left posterior
communicating artery region aneurysm.

## 2019-12-23 MED ORDER — NITROGLYCERIN 1 MG/10 ML FOR IR/CATH LAB
INTRA_ARTERIAL | Status: AC
  Filled 2019-12-23: qty 10

## 2019-12-23 MED ORDER — MIDAZOLAM HCL 2 MG/2ML IJ SOLN
INTRAMUSCULAR | Status: AC
  Filled 2019-12-23: qty 2

## 2019-12-23 MED ORDER — NITROGLYCERIN 1 MG/10 ML FOR IR/CATH LAB
INTRA_ARTERIAL | Status: AC | PRN
  Administered 2019-12-23 (×2): 200 ug via INTRA_ARTERIAL

## 2019-12-23 MED ORDER — SODIUM CHLORIDE 0.9 % IV SOLN: INTRAVENOUS | Status: DC

## 2019-12-23 MED ORDER — VERAPAMIL HCL 2.5 MG/ML IV SOLN
INTRAVENOUS | Status: AC
  Filled 2019-12-23: qty 2

## 2019-12-23 MED ORDER — FENTANYL CITRATE (PF) 100 MCG/2ML IJ SOLN
INTRAMUSCULAR | Status: AC
  Filled 2019-12-23: qty 2

## 2019-12-23 MED ORDER — SODIUM CHLORIDE 0.9 % IV SOLN
INTRAVENOUS | Status: AC | PRN
  Administered 2019-12-23: 250 mL via INTRAVENOUS
  Administered 2019-12-23: 75 mL/h via INTRAVENOUS

## 2019-12-23 MED ORDER — LIDOCAINE HCL (PF) 1 % IJ SOLN
INTRAMUSCULAR | Status: AC | PRN
  Administered 2019-12-23: 5 mL

## 2019-12-23 MED ORDER — LIDOCAINE HCL 1 % IJ SOLN
INTRAMUSCULAR | Status: AC
  Filled 2019-12-23: qty 20

## 2019-12-23 MED ORDER — SODIUM CHLORIDE 0.9 % IV SOLN: INTRAVENOUS | Status: AC

## 2019-12-23 MED ORDER — MIDAZOLAM HCL 2 MG/2ML IJ SOLN
INTRAMUSCULAR | Status: AC | PRN
  Administered 2019-12-23: 1 mg via INTRAVENOUS

## 2019-12-23 MED ORDER — HEPARIN SODIUM (PORCINE) 1000 UNIT/ML IJ SOLN
INTRAMUSCULAR | Status: AC | PRN
  Administered 2019-12-23: 2000 [IU] via INTRAVENOUS

## 2019-12-23 MED ORDER — VERAPAMIL HCL 2.5 MG/ML IV SOLN
INTRAVENOUS | Status: AC | PRN
  Administered 2019-12-23: 2.5 mg via INTRAVENOUS

## 2019-12-23 MED ORDER — IOHEXOL 300 MG/ML  SOLN
50.0000 mL | Freq: Once | INTRAMUSCULAR | Status: AC | PRN
  Administered 2019-12-23: 21 mL via INTRAVENOUS

## 2019-12-23 MED ORDER — IOHEXOL 300 MG/ML  SOLN
150.0000 mL | Freq: Once | INTRAMUSCULAR | Status: AC | PRN
  Administered 2019-12-23: 64 mL via INTRA_ARTERIAL

## 2019-12-23 MED ORDER — HEPARIN SODIUM (PORCINE) 1000 UNIT/ML IJ SOLN
INTRAMUSCULAR | Status: AC
  Filled 2019-12-23: qty 1

## 2019-12-23 MED ORDER — FENTANYL CITRATE (PF) 100 MCG/2ML IJ SOLN
INTRAMUSCULAR | Status: AC | PRN
  Administered 2019-12-23: 25 ug via INTRAVENOUS

## 2019-12-23 NOTE — Sedation Documentation (Signed)
Called to give report. Nurse unavailable. Will call back 

## 2019-12-23 NOTE — H&P (Signed)
Referring Physician(s): Xu,J  Supervising Physician: Julieanne Cotton  Richardson Status:  [facility] OP  Chief Complaint: Cerebrovascular disease/cerebral aneurysm   Subjective: Richardson familiar to IR service from consultation with Dr. Corliss Skains on 11/05/2019.  She is a 64 y.o. female, ex-smoker, with past medical history of CKD III, HTN, CHF with EF 30-35% by ECHO 07/22/19 who presented to [facility] ED 10/26/19 after episode of syncope at home possibly related to hypotensive episode resulting in decreased cerebral perfusion.  Repeat ECHO during that time showed significant improvement in her EF to 50-60% although cardioembolic source of her strokes remained feasible.   MR Brain 10/26/19 showed: 1. 2 small areas of acute infarct in Kendra left frontal lobe. 2. Moderate chronic microvascular ischemic change. Scattered areas of chronic microhemorrhage in Kendra brain compatible with chronic Hypertension.   CTA Head and Neck 10/26/19 showed: 1. Negative CTA for large vessel occlusion. 2. Moderate to advanced intracranial atherosclerotic disease as above. Notable findings include severe left P1 and bilateral P2 stenoses, with moderate stenoses about Kendra carotid siphons bilaterally. Extensive small vessel atheromatous irregularity, most pronounced about Kendra left MCA and bilateral PCA distributions. 3. 5 mm left PCOM aneurysm.  Following discussions with Dr. Corliss Skains she presents again today for diagnostic cerebral arteriogram.  She currently denies fever, headache, dyspnea, cough, abdominal/back pain, nausea, vomiting or bleeding.  She does have some left shoulder discomfort, occasional gluteal pain and she is anxious.  Past Medical History:  Diagnosis Date  . Abnormal radiographic examination   . CHF (congestive heart failure) (HCC)   . Chronic kidney disease, stage III (moderate)   . Chronic kidney disease, stage III (moderate)   . CN (constipation)   . Essential hypertension, malignant   . HTN  (hypertension)   . LVH (left ventricular hypertrophy)   . Protein-calorie malnutrition (HCC)    Past Surgical History:  Procedure Laterality Date  . BREAST EXCISIONAL BIOPSY Left      Allergies: Richardson has no known allergies.  Medications: Prior to Admission medications   Medication Sig Start Date End Date Taking? Authorizing Provider  amLODipine (NORVASC) 10 MG tablet Take 10 mg by mouth daily.   Yes [provider]  aspirin EC 81 MG tablet Take 1 tablet (81 mg total) by mouth daily. 10/28/19 01/26/20 Yes Dorcas Carrow, MD  carvedilol (COREG) 25 MG tablet Take 1 tablet (25 mg total) by mouth 2 (two) times daily. 09/17/19  Yes Jake Bathe, MD  clopidogrel (PLAVIX) 75 MG tablet Take 1 tablet (75 mg total) by mouth daily. 10/29/19 01/27/20 Yes Dorcas Carrow, MD  Multiple Vitamin (MULTIVITAMIN) tablet Take 1 tablet by mouth daily.   Yes [provider]  rosuvastatin (CRESTOR) 5 MG tablet Take 5 mg by mouth every Monday, Wednesday, and Friday.    Yes [provider]  sacubitril-valsartan (ENTRESTO) 97-103 MG Take 1 tablet by mouth 2 (two) times daily. 08/19/19  Yes Jake Bathe, MD     Vital Signs: BP (!) 168/101 (BP Location: Left Arm)   Pulse 66   Temp 98 F (36.7 C) (Oral)   Resp 18   Ht 5' 4.5" (1.638 m)   Wt 119 lb (54 kg)   SpO2 100%   BMI 20.11 kg/m   Physical Exam Richardson awake, alert.  Chest clear to auscultation bilaterally.  Heart with regular rate and rhythm.  Abdomen soft, positive bowel sounds, nontender.  No lower extremity edema.  Face symmetrical, tongue midline, strength 5/5 in all fours.  No  drift.  Imaging: No results found.  Labs:  CBC: Recent Labs    10/26/19 1548 10/27/19 0521 12/23/19 0651  WBC 6.3 6.6 4.4  HGB 13.6 13.6 12.9  HCT 41.7 41.3 40.5  PLT 194 213 201    COAGS: No results for input(s): INR, APTT in Kendra last 8760 hours.  BMP: Recent Labs    09/02/19 0923 10/26/19 1548 10/26/19 2025 10/27/19 0521    NA 141 141 143 137  K 4.5 4.2 3.8 3.8  CL 101 106 107 101  CO2 25 25 25 24   GLUCOSE 102* 111* 99 104*  BUN 35* 24* 23 21  CALCIUM 9.9 9.9 10.3 10.0  CREATININE 1.55* 1.20* 1.14* 1.25*  GFRNONAA 35* 47* 50* 45*  GFRAA 40* 55* 58* 52*    LIVER FUNCTION TESTS: No results for input(s): BILITOT, AST, ALT, ALKPHOS, PROT, ALBUMIN in Kendra last 8760 hours.  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  Following consultation with Dr. June she presents today for diagnostic cerebral arteriogram for further evaluation.Risks and benefits of procedure were discussed with Kendra Richardson including, but not limited to bleeding, infection, vascular injury or contrast induced renal failure.  This interventional procedure involves Kendra use of X-rays and because of Kendra nature of Kendra planned procedure, it is possible that we will have prolonged use of X-ray fluoroscopy.  Potential radiation risks to you include (but are not limited to) Kendra following: - A slightly elevated risk for cancer  several years later in life. This risk is typically less than 0.5% percent. This risk is low in comparison to Kendra normal incidence of human cancer, which is 33% for women and 50% for men according to Kendra [employer]. - Radiation induced injury can include skin redness, resembling a rash, tissue breakdown / ulcers and hair loss (which can be temporary or permanent).   Kendra likelihood of either of these occurring depends on Kendra difficulty of Kendra procedure and whether you  are sensitive to radiation due to previous procedures, disease, or genetic conditions.   IF your procedure requires a prolonged use of radiation, you will be notified and given written instructions for further action.  It is your responsibility to monitor Kendra irradiated area for Kendra 2 weeks following Kendra procedure and to notify your physician if you are concerned that you have suffered a radiation induced injury.    All of Kendra Richardson's questions were answered, Richardson is agreeable to proceed.  Consent signed and in chart.    Electronically Signed: D. Corliss Skains, PA-C 12/23/2019, 7:33 AM   I spent a total of 25 minutes at Kendra Kendra Richardson's bedside AND on Kendra Richardson's hospital floor or unit, greater than 50% of which was counseling/coordinating care for cerebral arteriogram

## 2019-12-23 NOTE — Progress Notes (Signed)
Discharge instructions reviewed with pt and her sister (via telephone) both voice understanding. 

## 2019-12-23 NOTE — Discharge Instructions (Addendum)
Radial Site Care  This sheet gives you information about how to care for yourself after your procedure. Your health care provider may also give you more specific instructions. If you have problems or questions, contact your health care provider. What can I expect after the procedure? After the procedure, it is common to have:  Bruising and tenderness at the catheter insertion area. Follow these instructions at home: Medicines  Take over-the-counter and prescription medicines only as told by your health care provider. Insertion site care  Follow instructions from your health care provider about how to take care of your insertion site. Make sure you: ? Wash your hands with soap and water before you change your bandage (dressing). If soap and water are not available, use hand sanitizer. ? Change your dressing as told by your health care provider. ? Leave stitches (sutures), skin glue, or adhesive strips in place. These skin closures may need to stay in place for 2 weeks or longer. If adhesive strip edges start to loosen and curl up, you may trim the loose edges. Do not remove adhesive strips completely unless your health care provider tells you to do that.  Check your insertion site every day for signs of infection. Check for: ? Redness, swelling, or pain. ? Fluid or blood. ? Pus or a bad smell. ? Warmth.  Do not take baths, swim, or use a hot tub until your health care provider approves.  You may shower 24-48 hours after the procedure, or as directed by your health care provider. ? Remove the dressing and gently wash the site with plain soap and water. ? Pat the area dry with a clean towel. ? Do not rub the site. That could cause bleeding.  Do not apply powder or lotion to the site. Activity   For 24 hours after the procedure, or as directed by your health care provider: ? Do not flex or bend the affected arm. ? Do not push or pull heavy objects with the affected arm. ? Do not  drive yourself home from the hospital or clinic. You may drive 24 hours after the procedure unless your health care provider tells you not to. ? Do not operate machinery or power tools.  Do not lift anything that is heavier than 10 lb (4.5 kg), or the limit that you are told, until your health care provider says that it is safe.  Ask your health care provider when it is okay to: ? Return to work or school. ? Resume usual physical activities or sports. ? Resume sexual activity. General instructions  If the catheter site starts to bleed, raise your arm and put firm pressure on the site. If the bleeding does not stop, get help right away. This is a medical emergency.  If you went home on the same day as your procedure, a responsible adult should be with you for the first 24 hours after you arrive home.  Keep all follow-up visits as told by your health care provider. This is important. Contact a health care provider if:  You have a fever.  You have redness, swelling, or yellow drainage around your insertion site. Get help right away if:  You have unusual pain at the radial site.  The catheter insertion area swells very fast.  The insertion area is bleeding, and the bleeding does not stop when you hold steady pressure on the area.  Your arm or hand becomes pale, cool, tingly, or numb. These symptoms may represent a serious problem   that is an emergency. Do not wait to see if the symptoms will go away. Get medical help right away. Call your local emergency services (911 in the [place] Do not drive yourself to the hospital. Summary  After the procedure, it is common to have bruising and tenderness at the site.  Follow instructions from your health care provider about how to take care of your radial site wound. Check the wound every day for signs of infection.  Do not lift anything that is heavier than 10 lb (4.5 kg), or the limit that you are told, until your health care provider says  that it is safe. This information is not intended to replace advice given to you by your health care provider. Make sure you discuss any questions you have with your health care provider. Document Revised: 07/16/2017 Document Reviewed: 07/16/2017 Elsevier Patient Education  2020 [employer] Cerebral Angiogram, Care After This sheet gives you information about how to care for yourself after your procedure. Your health care provider may also give you more specific instructions. If you have problems or questions, contact your health care provider. What can I expect after the procedure? After the procedure, it is common to have:  Bruising and tenderness at the catheter insertion site.  A mild headache. Follow these instructions at home: Insertion site care  Follow instructions from your health care provider about how to take care of the insertion site. Make sure you: ? Wash your hands with soap and water before and after you change your bandage (dressing). If soap and water are not available, use hand sanitizer. ? Change your dressing as told by your health care provider.  Do not take baths, swim, or use a hot tub until your health care provider approves. You may shower 24-48 hours after the procedure, or as told by your health care provider.  To clean your insertion site: ? Gently wash the site with plain soap and water. ? Pat the area dry with a clean towel. ? Do not rub the site. This may cause bleeding.  Do not apply powder or lotion to the site. Keep the site clean and dry. Infection signs Check your incision area every day for signs of infection. Check for:  Redness, swelling, or pain.  Fluid or blood.  Warmth.  Pus or a bad smell.  Activity  Do not drive for 24 hours if you were given a sedative during your procedure.  Rest as told by your health care provider.  Do not lift anything that is heavier than 10 lb (4.5 kg), or the limit that you are told, until your  health care provider says that it is safe.  Return to your normal activities as told by your health care provider, usually in about a week. Ask your health care provider what activities are safe for you. General instructions   If your insertion site starts to bleed, lie flat and put pressure on the site. If the bleeding does not stop, get help right away. This is a medical emergency.  Do not use any products that contain nicotine or tobacco, such as cigarettes, e-cigarettes, and chewing tobacco. If you need help quitting, ask your health care provider.  Take over-the-counter and prescription medicines only as told by your health care provider.  Drink enough fluid to keep your urine pale yellow. This helps flush the contrast dye from your body.  Keep all follow-up visits as directed by your health care provider. This is important. Contact a health   care provider if:  You have a fever or chills.  You have redness, swelling, or pain around your insertion site.  You have fluid or blood coming from your insertion site.  The insertion site feels warm to the touch.  You have pus or a bad smell coming from your insertion site.  You notice blood collecting in the tissue around the insertion site (hematoma). The hematoma may be painful to the touch. Get help right away if:  You have chest pain or trouble breathing.  You have severe pain or swelling at the insertion site.  The insertion area bleeds, and bleeding continues after 30 minutes of holding steady pressure on the site.  The arm or leg where the catheter was inserted is pale, cold, numb, tingling, or weak.  You have a rash.  You have any symptoms of a stroke. "BE FAST" is an easy way to remember the main warning signs of a stroke: ? B - Balance. Signs are dizziness, sudden trouble walking, or loss of balance. ? E - Eyes. Signs are trouble seeing or a sudden change in vision. ? F - Face. Signs are sudden weakness or numbness of  the face, or the face or eyelid drooping on one side. ? A - Arms. Signs are weakness or numbness in an arm. This happens suddenly and usually on one side of the body. ? S - Speech. Signs are sudden trouble speaking, slurred speech, or trouble understanding what people say. ? T - Time. Time to call emergency services. Write down what time symptoms started.  You have other signs of a stroke, such as: ? A sudden, severe headache with no known cause. ? Nausea or vomiting. ? Seizure. These symptoms may represent a serious problem that is an emergency. Do not wait to see if the symptoms will go away. Get medical help right away. Call your local emergency services (911 in the [place] Do not drive yourself to the hospital. Summary  Bruising and tenderness at the insertion site are common.  Follow your health care provider's instructions about caring for your insertion site. Change dressing and clean the area as instructed.  If your insertion site bleeds, apply direct pressure until bleeding stops.  Return to your normal activities as told by your health care provider. Ask what activities are safe.  Rest and drink plenty of fluids. This information is not intended to replace advice given to you by your health care provider. Make sure you discuss any questions you have with your health care provider. Document Revised: 12/29/2018 Document Reviewed: 12/29/2018 Elsevier Patient Education  2020 [employer] Moderate Conscious Sedation, Adult Sedation is the use of medicines to promote relaxation and relieve discomfort and anxiety. Moderate conscious sedation is a type of sedation. Under moderate conscious sedation, you are less alert than normal, but you are still able to respond to instructions, touch, or both. Moderate conscious sedation is used during short medical and dental procedures. It is milder than deep sedation, which is a type of sedation under which you cannot be easily woken up. It is also  milder than general anesthesia, which is the use of medicines to make you unconscious. Moderate conscious sedation allows you to return to your regular activities sooner. Tell a health care provider about:  Any allergies you have.  All medicines you are taking, including vitamins, herbs, eye drops, creams, and over-the-counter medicines.  Use of steroids (by mouth or creams).  Any problems you or family members have had with   sedatives and anesthetic medicines.  Any blood disorders you have.  Any surgeries you have had.  Any medical conditions you have, such as sleep apnea.  Whether you are pregnant or may be pregnant.  Any use of cigarettes, alcohol, marijuana, or street drugs. What are the risks? Generally, this is a safe procedure. However, problems may occur, including:  Getting too much medicine (oversedation).  Nausea.  Allergic reaction to medicines.  Trouble breathing. If this happens, a breathing tube may be used to help with breathing. It will be removed when you are awake and breathing on your own.  Heart trouble.  Lung trouble. What happens before the procedure? Staying hydrated Follow instructions from your health care provider about hydration, which may include:  Up to 2 hours before the procedure - you may continue to drink clear liquids, such as water, clear fruit juice, black coffee, and plain tea. Eating and drinking restrictions Follow instructions from your health care provider about eating and drinking, which may include:  8 hours before the procedure - stop eating heavy meals or foods such as meat, fried foods, or fatty foods.  6 hours before the procedure - stop eating light meals or foods, such as toast or cereal.  6 hours before the procedure - stop drinking milk or drinks that contain milk.  2 hours before the procedure - stop drinking clear liquids. Medicine Ask your health care provider about:  Changing or stopping your regular medicines.  This is especially important if you are taking diabetes medicines or blood thinners.  Taking medicines such as aspirin and ibuprofen. These medicines can thin your blood. Do not take these medicines before your procedure if your health care provider instructs you not to.  Tests and exams  You will have a physical exam.  You may have blood tests done to show: ? How well your kidneys and liver are working. ? How well your blood can clot. General instructions  Plan to have someone take you home from the hospital or clinic.  If you will be going home right after the procedure, plan to have someone with you for 24 hours. What happens during the procedure?  An IV tube will be inserted into one of your veins.  Medicine to help you relax (sedative) will be given through the IV tube.  The medical or dental procedure will be performed. What happens after the procedure?  Your blood pressure, heart rate, breathing rate, and blood oxygen level will be monitored often until the medicines you were given have worn off.  Do not drive for 24 hours. This information is not intended to replace advice given to you by your health care provider. Make sure you discuss any questions you have with your health care provider. Document Revised: 05/23/2017 Document Reviewed: 09/30/2015 Elsevier Patient Education  2020 [employer]  

## 2019-12-23 NOTE — Procedures (Signed)
S/P 4 vessel cerebral arteriogram . RT radial approach. Findings. 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 2.Lt ICA  Mild to mod supraclinoid ICAD. 3.Scattered  Mild to mod ICAD changes intracranially. S.Kirke Breach MD

## 2019-12-23 NOTE — Sedation Documentation (Signed)
ETC02 removed per Dr. Deveshwar  

## 2019-12-28 ENCOUNTER — Other Ambulatory Visit: Payer: Self-pay

## 2019-12-28 ENCOUNTER — Ambulatory Visit (HOSPITAL_COMMUNITY)
Admission: RE | Admit: 2019-12-28 | Discharge: 2019-12-28 | Disposition: A | Discharge status: Home / Self Care | Payer: No Typology Code available for payment source | Source: Ambulatory Visit | Attending: Interventional Radiology | Admitting: Interventional Radiology

## 2019-12-28 DIAGNOSIS — I671 Cerebral aneurysm, nonruptured: Secondary | ICD-10-CM

## 2019-12-28 NOTE — Progress Notes (Signed)
Chief Complaint: Patient was seen in consultation today for brain aneurysm  Referring Physician(s): Rosalin Hawking  Supervising Physician: Luanne Bras  Patient Status: [facility] - Out-pt  History of Present Illness: Kendra Richardson is a 64 y.o. female with a past medical history as below, with pertinent past medical history including CHF, CKD III, HTN, CHF with EF 30-35% by ECHO 07/22/19 who presented to ED 10/26/19 after episode of syncope at home.   MR Brain 10/26/19: 1. 2 small areas of acute infarct in the left frontal lobe. 2. Moderate chronic microvascular ischemic change. Scattered areas of chronic microhemorrhage in the brain compatible with chronic Hypertension.   CTA Head and Neck 10/26/19: 1. Negative CTA for large vessel occlusion. 2. Moderate to advanced intracranial atherosclerotic disease as above. Notable findings include severe left P1 and bilateral P2 stenoses, with moderate stenoses about the carotid siphons bilaterally. Extensive small vessel atheromatous irregularity, most pronounced about the left MCA and bilateral PCA distributions. 3. 5 mm left PCOM aneurysm.  The patient and her sister met with Dr. Estanislado Pandy 11/05/19 to discuss these findings and possible management options. The decision was made to pursue endovascular embolization and the patient had a diagnostic cerebral arteriogram 12/23/19.   4-vessel cerebral arteriogram 12/23/19: 1. Approx 5.69m x 3.5 mm Lt PCOM lobulated aneurysm. 2. 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 3. Scattered Mild to mod ICAD changes intracranially.  [facility] options[facility]   Past Medical History:  Diagnosis Date  . Abnormal radiographic examination   . CHF (congestive heart failure) ([place]   . Chronic kidney disease, stage III (moderate)   . Chronic kidney disease, stage III (moderate)   . CN (constipation)   . Essential hypertension, malignant   . HTN (hypertension)     . LVH (left ventricular hypertrophy)   . Protein-calorie malnutrition ([place]     Past Surgical History:  Procedure Laterality Date  . BREAST EXCISIONAL BIOPSY Left   . IR 3D INDEPENDENT WKST  12/23/2019  . IR ANGIO INTRA EXTRACRAN SEL COM CAROTID INNOMINATE BILAT MOD SED  12/23/2019  . IR ANGIO VERTEBRAL SEL SUBCLAVIAN INNOMINATE UNI L MOD SED  12/23/2019  . IR ANGIO VERTEBRAL SEL VERTEBRAL UNI R MOD SED  12/23/2019  . IR [place] VASC ACCESS RIGHT  12/23/2019    Allergies: Patient has no known allergies.  Medications: Prior to Admission medications   Medication Sig Start Date End Date Taking? Authorizing Provider  amLODipine (NORVASC) 10 MG tablet Take 10 mg by mouth daily.    [provider]  aspirin EC 81 MG tablet Take 1 tablet (81 mg total) by mouth daily. 10/28/19 01/26/20  GBarb Merino MD  carvedilol (COREG) 25 MG tablet Take 1 tablet (25 mg total) by mouth 2 (two) times daily. 09/17/19   SJerline Pain MD  clopidogrel (PLAVIX) 75 MG tablet Take 1 tablet (75 mg total) by mouth daily. 10/29/19 01/27/20  GBarb Merino MD  Multiple Vitamin (MULTIVITAMIN) tablet Take 1 tablet by mouth daily.    [provider]  rosuvastatin (CRESTOR) 5 MG tablet Take 5 mg by mouth every Monday, Wednesday, and Friday.     [provider]  sacubitril-valsartan (ENTRESTO) 97-103 MG Take 1 tablet by mouth 2 (two) times daily. 08/19/19   SJerline Pain MD     Family History  Problem Relation Age of Onset  . Breast cancer Mother   . Breast cancer Maternal Aunt   .  Stroke Father   . Kidney disease Father     Social History   Socioeconomic History  . Marital status: Single    Spouse name: Not on file  . Number of children: Not on file  . Years of education: Not on file  . Highest education level: Not on file  Occupational History  . Not on file  Tobacco Use  . Smoking status: Former Smoker  Substance and Sexual Activity  . Alcohol use: Not on file  . Drug use: Not on file   . Sexual activity: Not on file  Other Topics Concern  . Not on file  Social History Narrative  . Not on file   Social Determinants of Health   Financial Resource Strain:   . Difficulty of Paying Living Expenses:   Food Insecurity:   . Worried About Charity fundraiser in the Last Year:   . Arboriculturist in the Last Year:   Transportation Needs: No Transportation Needs  . Lack of Transportation (Medical): No  . Lack of Transportation (Non-Medical): No  Physical Activity:   . Days of Exercise per Week:   . Minutes of Exercise per Session:   Stress:   . Feeling of Stress :   Social Connections:   . Frequency of Communication with Friends and Family:   . Frequency of Social Gatherings with Friends and Family:   . Attends Religious Services:   . Active Member of Clubs or Organizations:   . Attends Archivist Meetings:   Marland Kitchen Marital Status:      Review of Systems: A 12 point ROS discussed and pertinent positives are indicated in the HPI above.  All other systems are negative.  Review of Systems  Respiratory: Negative for shortness of breath.   Cardiovascular: Negative for chest pain.  Gastrointestinal: Negative for nausea and vomiting.  Neurological: Negative for headaches.    Vital Signs: There were no vitals taken for this visit.  Physical Exam Constitutional:      General: She is not in acute distress.    Appearance: Normal appearance.  Pulmonary:     Effort: Pulmonary effort is normal.  Skin:    General: Skin is warm and dry.  Neurological:     Mental Status: She is alert and oriented to person, place, and time.      Imaging: CARDIAC EVENT MONITOR  Result Date: 12/11/2019  Sinus rhythm average HR 70 BPM  No atrial fibrillation, no pauses  Two brief episodes of non sustained ventricular tachycardia, 4 beats and 10 beats, both asymptomatic  Rare PAC, PVC  Recent ECHO EF low normal 50-55% Continue with coreg, beta blocker. No changes. Candee Furbish, MD  IR 3D Independent Darreld Mclean  Result Date: 12/28/2019 CLINICAL DATA:  Patient with history of stroke. Intermittent headaches. 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 EXAM: WORKSTATION 3D RECONSTRUCTION; IR ANGIO VERTEBRAL SEL SUBCLAVIAN INNOMINATE UNI LEFT MOD SED; IR ANGIO VERTEBRAL SEL VERTEBRAL UNI RIGHT MOD SED; BILATERAL COMMON CAROTID AND INNOMINATE ANGIOGRAPHY; IR ULTRASOUND GUIDANCE VASC ACCESS RIGHT COMPARISON:  CT angiogram of the head and neck of Oct 27, 2019 and MRI of the brain of Oct 26, 2019. MEDICATIONS: Heparin 2000 units IA; no antibiotic was administered within 1 hour of the procedure. ANESTHESIA/SEDATION: Versed 1 mg IV; Fentanyl 25 mcg IV Moderate Sedation Time:  57 minutes The patient was continuously monitored during the procedure by the interventional radiology nurse under my direct supervision. CONTRAST:  Isovue 300 approximately 65 mL FLUOROSCOPY TIME:  Fluoroscopy Time: 22 minutes 6 seconds (698 mGy). COMPLICATIONS: None immediate. TECHNIQUE: Informed written consent was obtained from the patient after a thorough discussion of the procedural risks, benefits and alternatives. All questions were addressed. Maximal Sterile Barrier Technique was utilized including caps, mask, sterile gowns, sterile gloves, sterile drape, hand hygiene and skin antiseptic. A timeout was performed prior to the initiation of the procedure. The right forearm to the wrist was prepped and draped in the usual sterile manner. The right radial artery morphology was then verified with ultrasound and documented. A dorsal palmar anastomosis was verified to be present. Using ultrasound guidance, transradial access was obtained with a micropuncture set over a 0.018 inch micro guidewire. A 4/5 French radial sheath was then inserted without difficulty. The obturator, and micro guidewire were removed. Good aspiration was obtained from the radial sheath. A cocktail of  2000 units of heparin, 2.5 mg of verapamil, and 200 mcg of nitroglycerin was then infused in diluted form without event. A right radial arteriogram was also obtained. Over a 0.035 Roadrunner guidewire, a 5 [place] Simmons 2 diagnostic catheter was advanced to the aortic arch region, and cannulations were performed of the right vertebral artery, the right common carotid artery, the left common carotid artery and the left subclavian arteries. Following the procedure, right radial puncture hemostasis was achieved with a wrist band. Distal right radial pulse was verified to be present. FINDINGS: The right common carotid arteriogram demonstrates the right external carotid artery and its major branches to be widely patent. The right internal carotid artery at the bulb to the cranial skull base also demonstrates wide patency. The petrous segment and the proximal cavernous segment are widely patent. There is diffuse segmental narrowing of the supraclinoid right ICA at the level of the ophthalmic artery with approximately 50% stenosis. The right middle cerebral artery and the anterior cerebral artery opacify into the capillary and venous phases. Mild caliber irregularity is seen of the right pericallosal artery probably also associated with intracranial arteriosclerosis. The right vertebral artery origin is widely patent. The vessel opacifies to the cranial skull base. Wide patency is seen of the right vertebrobasilar junction and the right posterior-inferior cerebellar artery. The PICA demonstrates focal areas of caliber irregularity and mild narrowing in its proximal 1/3 most consistent with intracranial arteriosclerosis. The basilar artery, the right posterior cerebral artery, the superior cerebellar arteries and the anterior-inferior cerebellar arteries opacify into the capillary and venous phases. Mild caliber irregularity and narrowing seen of both superior cerebellar arteries. Inflow of unopacified blood is seen in the  basilar artery from the contralateral vertebral artery. The origin of the left vertebral artery is widely patent. The vessel opacifies to the cranial skull base. Wide patency is seen of the left vertebrobasilar junction and the left posterior-inferior cerebellar artery. The basilar artery, the right posterior cerebral artery, the superior cerebellar arteries and the anterior-inferior cerebellar arteries opacify into the capillary and venous phases. There appears to be delayed filling of the left posterior cerebral artery. The petrous and the proximal cavernous segments are widely patent. The left common carotid arteriogram demonstrates the left external carotid artery and its major branches to be widely patent. The left internal carotid artery at the bulb to the cranial skull base also demonstrates wide patency. Mild to moderate narrowing is seen of the of the supraclinoid left ICA proximal to the left posterior communicating artery region aneurysm. Approximately 5.5 mm x 3.5 mm moderate sized neck mild  lobulated saccular aneurysm arising in the region of the left posterior communicating artery. Mild-to-moderate intracranial arteriosclerotic changes involving the anterior cerebral arteries, the pericallosal artery branches, and the superior division of the right middle cerebral artery, and the left saccular lobulated aneurysm is seen arising in the left posterior communicating artery region. A diminutive vessel is seen to arise in the region of the neck of the aneurysm. Just distal to this and also arising from the posterior wall of the left internal carotid artery is a prominent vessel that projects posteriorly supplying the distal left PCA distribution probably representing a dominant anterior choroidal artery. The middle aspect of this vessel appears to have focal areas of caliber irregularity also probably representing intracranial arteriosclerosis. The left middle cerebral artery and the left anterior cerebral  artery opacify into the capillary and venous phases. Again seen are mild to moderate areas of caliber irregularity involving the left pericallosal artery, and the M2 peri-insular branches of the superior division probably representing intracranial arteriosclerosis. A 3D rotational arteriogram was performed with reconstruction of the images on a separate workstation confirming the aneurysm morphology. 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 Scattered mild to moderate intracranial arteriosclerotic changes involving the pericallosal anterior cerebral artery branches, and the superior division of the left middle cerebral artery, and the right posterior-inferior cerebellar artery. 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 PLAN: Follow-up consult involving the management of the left posterior communicating artery region aneurysm. Electronically Signed   By: Luanne Bras M.D.   On: 12/23/2019 11:13   IR US Guide Vasc Access Right  Result Date: 12/28/2019 CLINICAL DATA:  Patient with history of stroke. Intermittent headaches. 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 EXAM: WORKSTATION 3D RECONSTRUCTION; IR ANGIO VERTEBRAL SEL SUBCLAVIAN INNOMINATE UNI LEFT MOD SED; IR ANGIO VERTEBRAL SEL VERTEBRAL UNI RIGHT MOD SED; BILATERAL COMMON CAROTID AND INNOMINATE ANGIOGRAPHY; IR ULTRASOUND GUIDANCE VASC ACCESS RIGHT COMPARISON:  CT angiogram of the head and neck of Oct 27, 2019 and MRI of the brain of Oct 26, 2019. MEDICATIONS: Heparin 2000 units IA; no antibiotic was administered within 1 hour of the procedure. ANESTHESIA/SEDATION: Versed 1 mg IV; Fentanyl 25 mcg IV Moderate Sedation Time:  57 minutes The patient was continuously monitored during the procedure by the interventional radiology nurse  under my direct supervision. CONTRAST:  Isovue 300 approximately 65 mL FLUOROSCOPY TIME:  Fluoroscopy Time: 22 minutes 6 seconds (698 mGy). COMPLICATIONS: None immediate. TECHNIQUE: Informed written consent was obtained from the patient after a thorough discussion of the procedural risks, benefits and alternatives. All questions were addressed. Maximal Sterile Barrier Technique was utilized including caps, mask, sterile gowns, sterile gloves, sterile drape, hand hygiene and skin antiseptic. A timeout was performed prior to the initiation of the procedure. The right forearm to the wrist was prepped and draped in the usual sterile manner. The right radial artery morphology was then verified with ultrasound and documented. A dorsal palmar anastomosis was verified to be present. Using ultrasound guidance, transradial access was obtained with a micropuncture set over a 0.018 inch micro guidewire. A 4/5 French radial sheath was then inserted without difficulty. The obturator, and micro guidewire were removed. Good aspiration was obtained from the radial sheath. A cocktail of 2000 units of heparin, 2.5 mg of verapamil, and 200 mcg of nitroglycerin was then infused in diluted  form without event. A right radial arteriogram was also obtained. Over a 0.035 Roadrunner guidewire, a 5 [place] Simmons 2 diagnostic catheter was advanced to the aortic arch region, and cannulations were performed of the right vertebral artery, the right common carotid artery, the left common carotid artery and the left subclavian arteries. Following the procedure, right radial puncture hemostasis was achieved with a wrist band. Distal right radial pulse was verified to be present. FINDINGS: The right common carotid arteriogram demonstrates the right external carotid artery and its major branches to be widely patent. The right internal carotid artery at the bulb to the cranial skull base also demonstrates wide patency. The petrous segment and the  proximal cavernous segment are widely patent. There is diffuse segmental narrowing of the supraclinoid right ICA at the level of the ophthalmic artery with approximately 50% stenosis. The right middle cerebral artery and the anterior cerebral artery opacify into the capillary and venous phases. Mild caliber irregularity is seen of the right pericallosal artery probably also associated with intracranial arteriosclerosis. The right vertebral artery origin is widely patent. The vessel opacifies to the cranial skull base. Wide patency is seen of the right vertebrobasilar junction and the right posterior-inferior cerebellar artery. The PICA demonstrates focal areas of caliber irregularity and mild narrowing in its proximal 1/3 most consistent with intracranial arteriosclerosis. The basilar artery, the right posterior cerebral artery, the superior cerebellar arteries and the anterior-inferior cerebellar arteries opacify into the capillary and venous phases. Mild caliber irregularity and narrowing seen of both superior cerebellar arteries. Inflow of unopacified blood is seen in the basilar artery from the contralateral vertebral artery. The origin of the left vertebral artery is widely patent. The vessel opacifies to the cranial skull base. Wide patency is seen of the left vertebrobasilar junction and the left posterior-inferior cerebellar artery. The basilar artery, the right posterior cerebral artery, the superior cerebellar arteries and the anterior-inferior cerebellar arteries opacify into the capillary and venous phases. There appears to be delayed filling of the left posterior cerebral artery. The petrous and the proximal cavernous segments are widely patent. The left common carotid arteriogram demonstrates the left external carotid artery and its major branches to be widely patent. The left internal carotid artery at the bulb to the cranial skull base also demonstrates wide patency. Mild to moderate narrowing is  seen of the of the supraclinoid left ICA proximal to the left posterior communicating artery region aneurysm. Approximately 5.5 mm x 3.5 mm moderate sized neck mild lobulated saccular aneurysm arising in the region of the left posterior communicating artery. Mild-to-moderate intracranial arteriosclerotic changes involving the anterior cerebral arteries, the pericallosal artery branches, and the superior division of the right middle cerebral artery, and the left saccular lobulated aneurysm is seen arising in the left posterior communicating artery region. A diminutive vessel is seen to arise in the region of the neck of the aneurysm. Just distal to this and also arising from the posterior wall of the left internal carotid artery is a prominent vessel that projects posteriorly supplying the distal left PCA distribution probably representing a dominant anterior choroidal artery. The middle aspect of this vessel appears to have focal areas of caliber irregularity also probably representing intracranial arteriosclerosis. The left middle cerebral artery and the left anterior cerebral artery opacify into the capillary and venous phases. Again seen are mild to moderate areas of caliber irregularity involving the left pericallosal artery, and the M2 peri-insular branches of the superior division probably representing intracranial arteriosclerosis. A 3D rotational  arteriogram was performed with reconstruction of the images on a separate workstation confirming the aneurysm morphology. 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 Scattered mild to moderate intracranial arteriosclerotic changes involving the pericallosal anterior cerebral artery branches, and the superior division of the left middle cerebral artery, and the right posterior-inferior cerebellar artery. Approximately 50% stenosis of the  right internal carotid artery and supraclinoid segment. PLAN: Follow-up consult involving the management of the left posterior communicating artery region aneurysm. Electronically Signed   By: Luanne Bras M.D.   On: 12/23/2019 11:13   IR ANGIO INTRA EXTRACRAN SEL COM CAROTID INNOMINATE BILAT MOD SED  Result Date: 12/28/2019 CLINICAL DATA:  Patient with history of stroke. Intermittent headaches. 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 EXAM: WORKSTATION 3D RECONSTRUCTION; IR ANGIO VERTEBRAL SEL SUBCLAVIAN INNOMINATE UNI LEFT MOD SED; IR ANGIO VERTEBRAL SEL VERTEBRAL UNI RIGHT MOD SED; BILATERAL COMMON CAROTID AND INNOMINATE ANGIOGRAPHY; IR ULTRASOUND GUIDANCE VASC ACCESS RIGHT COMPARISON:  CT angiogram of the head and neck of Oct 27, 2019 and MRI of the brain of Oct 26, 2019. MEDICATIONS: Heparin 2000 units IA; no antibiotic was administered within 1 hour of the procedure. ANESTHESIA/SEDATION: Versed 1 mg IV; Fentanyl 25 mcg IV Moderate Sedation Time:  57 minutes The patient was continuously monitored during the procedure by the interventional radiology nurse under my direct supervision. CONTRAST:  Isovue 300 approximately 65 mL FLUOROSCOPY TIME:  Fluoroscopy Time: 22 minutes 6 seconds (698 mGy). COMPLICATIONS: None immediate. TECHNIQUE: Informed written consent was obtained from the patient after a thorough discussion of the procedural risks, benefits and alternatives. All questions were addressed. Maximal Sterile Barrier Technique was utilized including caps, mask, sterile gowns, sterile gloves, sterile drape, hand hygiene and skin antiseptic. A timeout was performed prior to the initiation of the procedure. The right forearm to the wrist was prepped and draped in the usual sterile manner. The right radial artery morphology was then verified with ultrasound and documented. A dorsal palmar anastomosis was verified to be present. Using ultrasound guidance,  transradial access was obtained with a micropuncture set over a 0.018 inch micro guidewire. A 4/5 French radial sheath was then inserted without difficulty. The obturator, and micro guidewire were removed. Good aspiration was obtained from the radial sheath. A cocktail of 2000 units of heparin, 2.5 mg of verapamil, and 200 mcg of nitroglycerin was then infused in diluted form without event. A right radial arteriogram was also obtained. Over a 0.035 Roadrunner guidewire, a 5 [place] Simmons 2 diagnostic catheter was advanced to the aortic arch region, and cannulations were performed of the right vertebral artery, the right common carotid artery, the left common carotid artery and the left subclavian arteries. Following the procedure, right radial puncture hemostasis was achieved with a wrist band. Distal right radial pulse was verified to be present. FINDINGS: The right common carotid arteriogram demonstrates the right external carotid artery and its major branches to be widely patent. The right internal carotid artery at the bulb to the cranial skull base also demonstrates wide patency. The petrous segment and the proximal cavernous segment are widely patent. There is diffuse segmental narrowing of the supraclinoid right ICA at the level of the ophthalmic artery with approximately 50% stenosis. The right middle cerebral artery and the anterior cerebral artery opacify into the capillary and venous phases. Mild caliber irregularity is seen of the  right pericallosal artery probably also associated with intracranial arteriosclerosis. The right vertebral artery origin is widely patent. The vessel opacifies to the cranial skull base. Wide patency is seen of the right vertebrobasilar junction and the right posterior-inferior cerebellar artery. The PICA demonstrates focal areas of caliber irregularity and mild narrowing in its proximal 1/3 most consistent with intracranial arteriosclerosis. The basilar artery, the right  posterior cerebral artery, the superior cerebellar arteries and the anterior-inferior cerebellar arteries opacify into the capillary and venous phases. Mild caliber irregularity and narrowing seen of both superior cerebellar arteries. Inflow of unopacified blood is seen in the basilar artery from the contralateral vertebral artery. The origin of the left vertebral artery is widely patent. The vessel opacifies to the cranial skull base. Wide patency is seen of the left vertebrobasilar junction and the left posterior-inferior cerebellar artery. The basilar artery, the right posterior cerebral artery, the superior cerebellar arteries and the anterior-inferior cerebellar arteries opacify into the capillary and venous phases. There appears to be delayed filling of the left posterior cerebral artery. The petrous and the proximal cavernous segments are widely patent. The left common carotid arteriogram demonstrates the left external carotid artery and its major branches to be widely patent. The left internal carotid artery at the bulb to the cranial skull base also demonstrates wide patency. Mild to moderate narrowing is seen of the of the supraclinoid left ICA proximal to the left posterior communicating artery region aneurysm. Approximately 5.5 mm x 3.5 mm moderate sized neck mild lobulated saccular aneurysm arising in the region of the left posterior communicating artery. Mild-to-moderate intracranial arteriosclerotic changes involving the anterior cerebral arteries, the pericallosal artery branches, and the superior division of the right middle cerebral artery, and the left saccular lobulated aneurysm is seen arising in the left posterior communicating artery region. A diminutive vessel is seen to arise in the region of the neck of the aneurysm. Just distal to this and also arising from the posterior wall of the left internal carotid artery is a prominent vessel that projects posteriorly supplying the distal left PCA  distribution probably representing a dominant anterior choroidal artery. The middle aspect of this vessel appears to have focal areas of caliber irregularity also probably representing intracranial arteriosclerosis. The left middle cerebral artery and the left anterior cerebral artery opacify into the capillary and venous phases. Again seen are mild to moderate areas of caliber irregularity involving the left pericallosal artery, and the M2 peri-insular branches of the superior division probably representing intracranial arteriosclerosis. A 3D rotational arteriogram was performed with reconstruction of the images on a separate workstation confirming the aneurysm morphology. 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 Scattered mild to moderate intracranial arteriosclerotic changes involving the pericallosal anterior cerebral artery branches, and the superior division of the left middle cerebral artery, and the right posterior-inferior cerebellar artery. 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 PLAN: Follow-up consult involving the management of the left posterior communicating artery region aneurysm. Electronically Signed   By: Luanne Bras M.D.   On: 12/23/2019 11:13   IR ANGIO VERTEBRAL SEL SUBCLAVIAN INNOMINATE UNI L MOD SED  Result Date: 12/28/2019 CLINICAL DATA:  Patient with history of stroke. Intermittent headaches. 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 EXAM: WORKSTATION 3D RECONSTRUCTION; IR ANGIO VERTEBRAL SEL SUBCLAVIAN INNOMINATE UNI LEFT MOD SED; IR ANGIO VERTEBRAL SEL  VERTEBRAL UNI RIGHT MOD SED; BILATERAL COMMON CAROTID AND INNOMINATE ANGIOGRAPHY; IR ULTRASOUND GUIDANCE VASC ACCESS RIGHT COMPARISON:  CT angiogram of the head and neck of Oct 27, 2019 and MRI of the  brain of Oct 26, 2019. MEDICATIONS: Heparin 2000 units IA; no antibiotic was administered within 1 hour of the procedure. ANESTHESIA/SEDATION: Versed 1 mg IV; Fentanyl 25 mcg IV Moderate Sedation Time:  57 minutes The patient was continuously monitored during the procedure by the interventional radiology nurse under my direct supervision. CONTRAST:  Isovue 300 approximately 65 mL FLUOROSCOPY TIME:  Fluoroscopy Time: 22 minutes 6 seconds (698 mGy). COMPLICATIONS: None immediate. TECHNIQUE: Informed written consent was obtained from the patient after a thorough discussion of the procedural risks, benefits and alternatives. All questions were addressed. Maximal Sterile Barrier Technique was utilized including caps, mask, sterile gowns, sterile gloves, sterile drape, hand hygiene and skin antiseptic. A timeout was performed prior to the initiation of the procedure. The right forearm to the wrist was prepped and draped in the usual sterile manner. The right radial artery morphology was then verified with ultrasound and documented. A dorsal palmar anastomosis was verified to be present. Using ultrasound guidance, transradial access was obtained with a micropuncture set over a 0.018 inch micro guidewire. A 4/5 French radial sheath was then inserted without difficulty. The obturator, and micro guidewire were removed. Good aspiration was obtained from the radial sheath. A cocktail of 2000 units of heparin, 2.5 mg of verapamil, and 200 mcg of nitroglycerin was then infused in diluted form without event. A right radial arteriogram was also obtained. Over a 0.035 Roadrunner guidewire, a 5 [place] Simmons 2 diagnostic catheter was advanced to the aortic arch region, and cannulations were performed of the right vertebral artery, the right common carotid artery, the left common carotid artery and the left subclavian arteries. Following the procedure, right radial puncture hemostasis was achieved with a wrist band. Distal right  radial pulse was verified to be present. FINDINGS: The right common carotid arteriogram demonstrates the right external carotid artery and its major branches to be widely patent. The right internal carotid artery at the bulb to the cranial skull base also demonstrates wide patency. The petrous segment and the proximal cavernous segment are widely patent. There is diffuse segmental narrowing of the supraclinoid right ICA at the level of the ophthalmic artery with approximately 50% stenosis. The right middle cerebral artery and the anterior cerebral artery opacify into the capillary and venous phases. Mild caliber irregularity is seen of the right pericallosal artery probably also associated with intracranial arteriosclerosis. The right vertebral artery origin is widely patent. The vessel opacifies to the cranial skull base. Wide patency is seen of the right vertebrobasilar junction and the right posterior-inferior cerebellar artery. The PICA demonstrates focal areas of caliber irregularity and mild narrowing in its proximal 1/3 most consistent with intracranial arteriosclerosis. The basilar artery, the right posterior cerebral artery, the superior cerebellar arteries and the anterior-inferior cerebellar arteries opacify into the capillary and venous phases. Mild caliber irregularity and narrowing seen of both superior cerebellar arteries. Inflow of unopacified blood is seen in the basilar artery from the contralateral vertebral artery. The origin of the left vertebral artery is widely patent. The vessel opacifies to the cranial skull base. Wide patency is seen of the left vertebrobasilar junction and the left posterior-inferior cerebellar artery. The basilar artery, the right posterior cerebral artery, the superior cerebellar arteries and the anterior-inferior cerebellar arteries opacify into the capillary and venous phases. There appears  to be delayed filling of the left posterior cerebral artery. The petrous and  the proximal cavernous segments are widely patent. The left common carotid arteriogram demonstrates the left external carotid artery and its major branches to be widely patent. The left internal carotid artery at the bulb to the cranial skull base also demonstrates wide patency. Mild to moderate narrowing is seen of the of the supraclinoid left ICA proximal to the left posterior communicating artery region aneurysm. Approximately 5.5 mm x 3.5 mm moderate sized neck mild lobulated saccular aneurysm arising in the region of the left posterior communicating artery. Mild-to-moderate intracranial arteriosclerotic changes involving the anterior cerebral arteries, the pericallosal artery branches, and the superior division of the right middle cerebral artery, and the left saccular lobulated aneurysm is seen arising in the left posterior communicating artery region. A diminutive vessel is seen to arise in the region of the neck of the aneurysm. Just distal to this and also arising from the posterior wall of the left internal carotid artery is a prominent vessel that projects posteriorly supplying the distal left PCA distribution probably representing a dominant anterior choroidal artery. The middle aspect of this vessel appears to have focal areas of caliber irregularity also probably representing intracranial arteriosclerosis. The left middle cerebral artery and the left anterior cerebral artery opacify into the capillary and venous phases. Again seen are mild to moderate areas of caliber irregularity involving the left pericallosal artery, and the M2 peri-insular branches of the superior division probably representing intracranial arteriosclerosis. A 3D rotational arteriogram was performed with reconstruction of the images on a separate workstation confirming the aneurysm morphology. IMPRESSION: Approximately 5.5 mm x 3.5 mm moderate sized neck mildly lobulated saccular aneurysm arising in the left posterior communicating  artery region of the left ICA. Scattered mild to moderate intracranial arteriosclerotic changes involving the pericallosal anterior cerebral artery branches, and the superior division of the left middle cerebral artery, and the right posterior-inferior cerebellar artery. 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 PLAN: Follow-up consult involving the management of the left posterior communicating artery region aneurysm. Electronically Signed   By: Luanne Bras M.D.   On: 12/23/2019 11:13   IR ANGIO VERTEBRAL SEL VERTEBRAL UNI R MOD SED  Result Date: 12/28/2019 CLINICAL DATA:  Patient with history of stroke. Intermittent headaches. 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 EXAM: WORKSTATION 3D RECONSTRUCTION; IR ANGIO VERTEBRAL SEL SUBCLAVIAN INNOMINATE UNI LEFT MOD SED; IR ANGIO VERTEBRAL SEL VERTEBRAL UNI RIGHT MOD SED; BILATERAL COMMON CAROTID AND INNOMINATE ANGIOGRAPHY; IR ULTRASOUND GUIDANCE VASC ACCESS RIGHT COMPARISON:  CT angiogram of the head and neck of Oct 27, 2019 and MRI of the brain of Oct 26, 2019. MEDICATIONS: Heparin 2000 units IA; no antibiotic was administered within 1 hour of the procedure. ANESTHESIA/SEDATION: Versed 1 mg IV; Fentanyl 25 mcg IV Moderate Sedation Time:  57 minutes The patient was continuously monitored during the procedure by the interventional radiology nurse under my direct supervision. CONTRAST:  Isovue 300 approximately 65 mL FLUOROSCOPY TIME:  Fluoroscopy Time: 22 minutes 6 seconds (698 mGy). COMPLICATIONS: None immediate. TECHNIQUE: Informed written consent was obtained from the patient after a thorough discussion of the procedural risks, benefits and alternatives. All questions were addressed. Maximal Sterile Barrier Technique was utilized including caps, mask, sterile gowns, sterile gloves, sterile drape, hand hygiene and skin antiseptic. A timeout was performed prior to  the initiation of the procedure. The right forearm to the wrist was  prepped and draped in the usual sterile manner. The right radial artery morphology was then verified with ultrasound and documented. A dorsal palmar anastomosis was verified to be present. Using ultrasound guidance, transradial access was obtained with a micropuncture set over a 0.018 inch micro guidewire. A 4/5 French radial sheath was then inserted without difficulty. The obturator, and micro guidewire were removed. Good aspiration was obtained from the radial sheath. A cocktail of 2000 units of heparin, 2.5 mg of verapamil, and 200 mcg of nitroglycerin was then infused in diluted form without event. A right radial arteriogram was also obtained. Over a 0.035 Roadrunner guidewire, a 5 [place] Simmons 2 diagnostic catheter was advanced to the aortic arch region, and cannulations were performed of the right vertebral artery, the right common carotid artery, the left common carotid artery and the left subclavian arteries. Following the procedure, right radial puncture hemostasis was achieved with a wrist band. Distal right radial pulse was verified to be present. FINDINGS: The right common carotid arteriogram demonstrates the right external carotid artery and its major branches to be widely patent. The right internal carotid artery at the bulb to the cranial skull base also demonstrates wide patency. The petrous segment and the proximal cavernous segment are widely patent. There is diffuse segmental narrowing of the supraclinoid right ICA at the level of the ophthalmic artery with approximately 50% stenosis. The right middle cerebral artery and the anterior cerebral artery opacify into the capillary and venous phases. Mild caliber irregularity is seen of the right pericallosal artery probably also associated with intracranial arteriosclerosis. The right vertebral artery origin is widely patent. The vessel opacifies to the cranial skull base. Wide  patency is seen of the right vertebrobasilar junction and the right posterior-inferior cerebellar artery. The PICA demonstrates focal areas of caliber irregularity and mild narrowing in its proximal 1/3 most consistent with intracranial arteriosclerosis. The basilar artery, the right posterior cerebral artery, the superior cerebellar arteries and the anterior-inferior cerebellar arteries opacify into the capillary and venous phases. Mild caliber irregularity and narrowing seen of both superior cerebellar arteries. Inflow of unopacified blood is seen in the basilar artery from the contralateral vertebral artery. The origin of the left vertebral artery is widely patent. The vessel opacifies to the cranial skull base. Wide patency is seen of the left vertebrobasilar junction and the left posterior-inferior cerebellar artery. The basilar artery, the right posterior cerebral artery, the superior cerebellar arteries and the anterior-inferior cerebellar arteries opacify into the capillary and venous phases. There appears to be delayed filling of the left posterior cerebral artery. The petrous and the proximal cavernous segments are widely patent. The left common carotid arteriogram demonstrates the left external carotid artery and its major branches to be widely patent. The left internal carotid artery at the bulb to the cranial skull base also demonstrates wide patency. Mild to moderate narrowing is seen of the of the supraclinoid left ICA proximal to the left posterior communicating artery region aneurysm. Approximately 5.5 mm x 3.5 mm moderate sized neck mild lobulated saccular aneurysm arising in the region of the left posterior communicating artery. Mild-to-moderate intracranial arteriosclerotic changes involving the anterior cerebral arteries, the pericallosal artery branches, and the superior division of the right middle cerebral artery, and the left saccular lobulated aneurysm is seen arising in the left posterior  communicating artery region. A diminutive vessel is seen to arise in the region of the neck of the aneurysm. Just distal to this and also arising from the posterior wall of the  left internal carotid artery is a prominent vessel that projects posteriorly supplying the distal left PCA distribution probably representing a dominant anterior choroidal artery. The middle aspect of this vessel appears to have focal areas of caliber irregularity also probably representing intracranial arteriosclerosis. The left middle cerebral artery and the left anterior cerebral artery opacify into the capillary and venous phases. Again seen are mild to moderate areas of caliber irregularity involving the left pericallosal artery, and the M2 peri-insular branches of the superior division probably representing intracranial arteriosclerosis. A 3D rotational arteriogram was performed with reconstruction of the images on a separate workstation confirming the aneurysm morphology. 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 Scattered mild to moderate intracranial arteriosclerotic changes involving the pericallosal anterior cerebral artery branches, and the superior division of the left middle cerebral artery, and the right posterior-inferior cerebellar artery. 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 PLAN: Follow-up consult involving the management of the left posterior communicating artery region aneurysm. Electronically Signed   By: Luanne Bras M.D.   On: 12/23/2019 11:13    Labs:  CBC: Recent Labs    10/26/19 1548 10/27/19 0521 12/23/19 0651  WBC 6.3 6.6 4.4  HGB 13.6 13.6 12.9  HCT 41.7 41.3 40.5  PLT 194 213 201    COAGS: Recent Labs    12/23/19 0651  INR 1.0  APTT 29    BMP: Recent Labs    10/26/19 1548 10/26/19 2025  10/27/19 0521 12/23/19 0651  NA 141 143 137 140  K 4.2 3.8 3.8 3.6  CL 106 107 101 106  CO2 _0 GLUCOSE 111* 99 104* 110*  BUN 24* _1 CALCIUM 9.9 10.3 10.0 9.7  CREATININE 1.20* 1.14* 1.25* 1.33*  GFRNONAA 47* 50* 45* 42*  GFRAA 55* 58* 52* 48*    LIVER FUNCTION TESTS: No results for input(s): BILITOT, AST, ALT, ALKPHOS, PROT, ALBUMIN in the last 8760 hours.  TUMOR MARKERS: No results for input(s): AFPTM, CEA, CA199, CHROMGRNA in the last 8760 hours.  Assessment and Plan:  5 mm left PCOM aneurysm: Patient currently takes aspirin 81 mg and Plavix 75 mg PO Daily. She understands to continue taking both of these medications.   Dr. Estanislado Pandy was present for consultation.   Plan for follow-up: The patient and her sister are in agreement to proceed with an endovascular embolization procedure (coils and/or stent placement or WEB placement). The patient and her sister understand that this procedure will require general anesthesia and a minimum of one-night stay in the hospital for observation.   Informed patient that our schedulers will call her to schedule the date.   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 Estanislado Pandy discussed the presence of several areas of narrowing found on imaging.  These are not likely the cause of her syncope, however now that they have been found, they do warrant monitoring with routine imaging scans and possible intervention in the future.  The patient has future appointments for physical therapy/rehab and she has been encouraged to keep these appointments.   The patient states she has quit smoking and she has bee encouraged to continue smoking cessation.   All questions answered and concerns addressed.  Patient conveys understanding and agrees with plan.  Thank you for this interesting consult.  I greatly  enjoyed meeting [facility] and look forward to  participating in their care.  A copy of this report was sent to the requesting provider on this date.  Electronically Signed: Theresa Duty, NP 12/28/2019, 3:25 PM   I spent a total of  25 Minutes in face to face in clinical consultation, greater than 50% of which was counseling/coordinating care for endovascular embolization of 5 mm left PCOM aneurysm

## 2019-12-29 ENCOUNTER — Ambulatory Visit: Payer: No Typology Code available for payment source | Attending: Internal Medicine | Admitting: Occupational Therapy

## 2019-12-29 ENCOUNTER — Encounter: Payer: Self-pay | Admitting: Occupational Therapy

## 2019-12-29 DIAGNOSIS — M6281 Muscle weakness (generalized): Secondary | ICD-10-CM

## 2019-12-29 DIAGNOSIS — R41840 Attention and concentration deficit: Secondary | ICD-10-CM

## 2019-12-29 DIAGNOSIS — R2681 Unsteadiness on feet: Secondary | ICD-10-CM | POA: Diagnosis present

## 2019-12-29 DIAGNOSIS — R41842 Visuospatial deficit: Secondary | ICD-10-CM | POA: Insufficient documentation

## 2019-12-29 DIAGNOSIS — M25612 Stiffness of left shoulder, not elsewhere classified: Secondary | ICD-10-CM | POA: Insufficient documentation

## 2019-12-29 NOTE — Therapy (Signed)
[facility] [address] [place], [place], [address] Phone: [phone]   Fax:  [phone]  Occupational Therapy Treatment  Patient Details  Name: Jeania Nater MRN: [id number] Date of Birth: 1956/02/24 Referring Provider (OT): Dr. Tenny Craw, PCP (Dr. Dina Rich- hospitalist)   Encounter Date: 12/29/2019   OT End of Session - 12/29/19 1320    Visit Number 10    Number of Visits 25    Date for OT Re-Evaluation 02/08/20    Authorization Type UHC    Authorization - Visit Number 10    Authorization - Number of Visits 20    OT Start Time 1317    OT Stop Time 1357    OT Time Calculation (min) 40 min    Activity Tolerance Patient tolerated treatment well    Behavior During Therapy [facility] for tasks assessed/performed           Past Medical History:  Diagnosis Date  . Abnormal radiographic examination   . CHF (congestive heart failure) (HCC)   . Chronic kidney disease, stage III (moderate)   . Chronic kidney disease, stage III (moderate)   . CN (constipation)   . Essential hypertension, malignant   . HTN (hypertension)   . LVH (left ventricular hypertrophy)   . Protein-calorie malnutrition (HCC)     Past Surgical History:  Procedure Laterality Date  . BREAST EXCISIONAL BIOPSY Left   . IR 3D INDEPENDENT WKST  12/23/2019  . IR ANGIO INTRA EXTRACRAN SEL COM CAROTID INNOMINATE BILAT MOD SED  12/23/2019  . IR ANGIO VERTEBRAL SEL SUBCLAVIAN INNOMINATE UNI L MOD SED  12/23/2019  . IR ANGIO VERTEBRAL SEL VERTEBRAL UNI R MOD SED  12/23/2019  . IR US GUIDE VASC ACCESS RIGHT  12/23/2019    There were no vitals filed for this visit.   Subjective Assessment - 12/29/19 1320    Currently in Pain? No/denies                      Supine, gentle joint mobs to L shoulder for stretch.  Followed by [facility] with cane in supine BUEs for shoulder flex, chest press, seated shoulder flexion and , abduction.    Shoulder flex  AAROM along wall with ball (BUEs) with min cueing for posture/decr compensation  Wall push-ups with min cueing   Arm bike x6 min level 1 for reciprocal movement (forward/backwards without rest)  UE ranger mid-high range then circumduction, and reaching cross body both directions with stepping min v.c, facilitation at scapula  Per MD note pt was encouraged to continue with therapy following her procedure.           OT Short Term Goals - 12/03/19 1239      OT SHORT TERM GOAL #1   Title I with inital HEP.    Time 6    Period Weeks    Status Achieved    Target Date 12/25/19      OT SHORT TERM GOAL #2   Title Pt will demonstrate 110 shoulder flexion in prep for functional reach    Baseline 100    Time 6    Period Weeks    Status Achieved   12/03/19:  110*     OT SHORT TERM GOAL #3   Title Pt will verbalize understanding of compensatory strategies for visual impairments.    Time 6    Period Weeks    Status Deferred   12/03/19:  100% accuarcy with environmental scanning in  busy environment, no complaints of diplopia or dizzness.     OT SHORT TERM GOAL #4   Title Pt will perfrom basic home managment/ cooking with supervision demonstrating good safety awareness.    Time 6    Period Weeks    Status Achieved   12/03/19:  per pt report     OT SHORT TERM GOAL #5   Title Further assess cognition in a functional context and set goals as needed.    Time 6    Period Weeks    Status Deferred   12/03/19:  appears WFL for functional tasks in clinic            OT Long Term Goals - 12/29/19 1321      OT LONG TERM GOAL #1   Title I with updated HEP    Time 12    Period Weeks    Status On-going      OT LONG TERM GOAL #2   Title Pt will retrieve a lightweight object at 115 shoulder flexion with LUE.    Baseline 100    Time 12    Period Weeks    Status On-going      OT LONG TERM GOAL #3   Title Pt will perform mod complex home managment/ cooking modified independently.     Time 12    Period Weeks    Status Achieved   Pt reports she cooked a full meal without any problems, pt has been perfroming cleaning     OT LONG TERM GOAL #4   Title Pt will demonstrate ability to perform a physical and cogntive task simultaneously with 90% or better accuracy in prep for driving.    Time 12    Period Weeks    Status On-going      OT LONG TERM GOAL #5   Title Pt will navigate a busy environment and locate itmes with 90% or better accuracy and no reports of double vison.    Time 12    Period Weeks    Status On-going   12/03/19:  100% with no reports of diplopia     OT LONG TERM GOAL #6   Title Pt will resume planting flowers modified independently.    Time 12    Period Weeks    Status Achieved                  Patient will benefit from skilled therapeutic intervention in order to improve the following deficits and impairments:           Visit Diagnosis: No diagnosis found.    Problem List Patient Active Problem List   Diagnosis Date Noted  . Acute cerebrovascular accident (CVA) (HCC) 10/26/2019  . Chronic kidney disease, stage III (moderate)   . HFrEF (heart failure with reduced ejection fraction) (HCC) 08/05/2019  . Essential hypertension 07/07/2019  . Nonspecific abnormal electrocardiogram (ECG) (EKG) 07/07/2019    Hayley Horn 12/29/2019, 1:50 PM  [facility] [address] [place], [place], [address] Phone: [phone]   Fax:  [phone]  Name: Barb Shear MRN: [id number] Date of Birth: July 22, 1955

## 2019-12-30 ENCOUNTER — Ambulatory Visit: Payer: Self-pay | Admitting: *Deleted

## 2019-12-31 ENCOUNTER — Other Ambulatory Visit: Payer: Self-pay | Admitting: *Deleted

## 2019-12-31 ENCOUNTER — Ambulatory Visit: Payer: No Typology Code available for payment source

## 2019-12-31 ENCOUNTER — Other Ambulatory Visit: Payer: Self-pay

## 2019-12-31 ENCOUNTER — Ambulatory Visit: Payer: No Typology Code available for payment source | Admitting: Occupational Therapy

## 2019-12-31 ENCOUNTER — Encounter: Payer: Self-pay | Admitting: Occupational Therapy

## 2019-12-31 DIAGNOSIS — M25612 Stiffness of left shoulder, not elsewhere classified: Secondary | ICD-10-CM

## 2019-12-31 DIAGNOSIS — R41840 Attention and concentration deficit: Secondary | ICD-10-CM

## 2019-12-31 DIAGNOSIS — R41842 Visuospatial deficit: Secondary | ICD-10-CM

## 2019-12-31 DIAGNOSIS — R2681 Unsteadiness on feet: Secondary | ICD-10-CM

## 2019-12-31 DIAGNOSIS — M6281 Muscle weakness (generalized): Secondary | ICD-10-CM

## 2019-12-31 NOTE — Patient Instructions (Signed)
Perform your previous stretches with the cane then perform the following :  Laying on your back hold a 2 lbs weight between hands, raise arms overhead, 10-15 reps 1x day.  Laying on your back hold a 1 lbs weight in your left hand raise arm overhead 10-15 reps 1x day  Perform wall pushups 10 reps each.

## 2019-12-31 NOTE — Therapy (Signed)
[facility] [address] [place], [place], [address] Phone: [phone]   Fax:  [phone]  Occupational Therapy Treatment  Patient Details  Name: Kendra Richardson MRN: [id number] Date of Birth: 1956/02/19 Referring Provider (OT): Dr. Tenny Craw, PCP (Dr. Dina Rich- hospitalist)   Encounter Date: 12/31/2019   OT End of Session - 12/31/19 0100    Visit Number 11    Number of Visits 25    Date for OT Re-Evaluation 02/08/20    Authorization Type UHC    Authorization - Visit Number 11    Authorization - Number of Visits 20    OT Start Time 1215    OT Stop Time 1255    OT Time Calculation (min) 40 min    Activity Tolerance Patient tolerated treatment well    Behavior During Therapy [facility] for tasks assessed/performed           Past Medical History:  Diagnosis Date  . Abnormal radiographic examination   . CHF (congestive heart failure) (HCC)   . Chronic kidney disease, stage III (moderate)   . Chronic kidney disease, stage III (moderate)   . CN (constipation)   . Essential hypertension, malignant   . HTN (hypertension)   . LVH (left ventricular hypertrophy)   . Protein-calorie malnutrition (HCC)     Past Surgical History:  Procedure Laterality Date  . BREAST EXCISIONAL BIOPSY Left   . IR 3D INDEPENDENT WKST  12/23/2019  . IR ANGIO INTRA EXTRACRAN SEL COM CAROTID INNOMINATE BILAT MOD SED  12/23/2019  . IR ANGIO VERTEBRAL SEL SUBCLAVIAN INNOMINATE UNI L MOD SED  12/23/2019  . IR ANGIO VERTEBRAL SEL VERTEBRAL UNI R MOD SED  12/23/2019  . IR US GUIDE VASC ACCESS RIGHT  12/23/2019    There were no vitals filed for this visit.   Subjective Assessment - 12/31/19 1217    Subjective  Pt denies pain    Currently in Pain? No/denies                 Treatment:supine joint mobs to LUE followed by AA/ROM shoulder flexion, then shoulder flexion with !lbs weight in LUE unilaterally and overhead shoulder circles 10 reps  each direction, min v.c Seated closed chain diagonals with medium ball, min v.c/ facilitation for positioning. Prone shoulder extension bilateral, with scapular retraction followed by unilateral rowing with LUE over edge of bed , the unilateral LUE shoulder ext with 1 lbs weight. Wall pushups and rolling ball up wall x 10 reps min v.c Ambulating while multi tasking to perform cognitive task and toss ball 90% or better accuracy. [facility] radiology note said for pt to keep remaining therapy appts.                 OT Short Term Goals - 12/03/19 1239      OT SHORT TERM GOAL #1   Title I with inital HEP.    Time 6    Period Weeks    Status Achieved    Target Date 12/25/19      OT SHORT TERM GOAL #2   Title Pt will demonstrate 110 shoulder flexion in prep for functional reach    Baseline 100    Time 6    Period Weeks    Status Achieved   12/03/19:  110*     OT SHORT TERM GOAL #3   Title Pt will verbalize understanding of compensatory strategies for visual impairments.    Time 6    Period Weeks  Status Deferred   12/03/19:  100% accuarcy with environmental scanning in busy environment, no complaints of diplopia or dizzness.     OT SHORT TERM GOAL #4   Title Pt will perfrom basic home managment/ cooking with supervision demonstrating good safety awareness.    Time 6    Period Weeks    Status Achieved   12/03/19:  per pt report     OT SHORT TERM GOAL #5   Title Further assess cognition in a functional context and set goals as needed.    Time 6    Period Weeks    Status Deferred   12/03/19:  appears WFL for functional tasks in clinic            OT Long Term Goals - 12/29/19 1321      OT LONG TERM GOAL #1   Title I with updated HEP    Time 12    Period Weeks    Status On-going      OT LONG TERM GOAL #2   Title Pt will retrieve a lightweight object at 115 shoulder flexion with LUE.    Baseline 100    Time 12    Period Weeks    Status On-going      OT  LONG TERM GOAL #3   Title Pt will perform mod complex home managment/ cooking modified independently.    Time 12    Period Weeks    Status Achieved   Pt reports she cooked a full meal without any problems, pt has been perfroming cleaning     OT LONG TERM GOAL #4   Title Pt will demonstrate ability to perform a physical and cogntive task simultaneously with 90% or better accuracy in prep for driving.    Time 12    Period Weeks    Status On-going      OT LONG TERM GOAL #5   Title Pt will navigate a busy environment and locate itmes with 90% or better accuracy and no reports of double vison.    Time 12    Period Weeks    Status On-going   12/03/19:  100% with no reports of diplopia     OT LONG TERM GOAL #6   Title Pt will resume planting flowers modified independently.    Time 12    Period Weeks    Status Achieved                 Plan - 12/31/19 1222    Clinical Impression Statement Pt is progressing towards goals. She demonstrates improving shoulder mobility.    Occupational performance deficits (Please refer to evaluation for details): ADL's;IADL's;Leisure;Social Participation    Body Structure / Function / Physical Skills ADL;Endurance;UE functional use;Balance;Flexibility;Pain;FMC;ROM;Coordination;Decreased knowledge of precautions;Decreased knowledge of use of DME;IADL;Strength;Mobility    Cognitive Skills Attention;Memory;Safety Awareness;Understand;Thought    OT Frequency 2x / week    OT Duration 12 weeks    OT Treatment/Interventions Self-care/ADL training;Ultrasound;Energy conservation;Visual/perceptual remediation/compensation;Patient/family education;Aquatic Therapy;DME and/or AE instruction;Balance training;Passive range of motion;Paraffin;Cryotherapy;Fluidtherapy;Splinting;Functional Mobility Training;Electrical Stimulation;Moist Heat;Therapeutic exercise;Manual Therapy;Therapeutic activities;Cognitive remediation/compensation;Neuromuscular education    Plan continue  to address ROM and work towards goals. Pt is only scheduled through next week, she does not want to schedule more appts due to upcoming procedure    Consulted and Agree with Plan of Care Patient           Patient will benefit from skilled therapeutic intervention in order to improve the following deficits and impairments:   Body  Structure / Function / Physical Skills: ADL, Endurance, UE functional use, Balance, Flexibility, Pain, FMC, ROM, Coordination, Decreased knowledge of precautions, Decreased knowledge of use of DME, IADL, Strength, Mobility Cognitive Skills: Attention, Memory, Safety Awareness, Understand, Thought     Visit Diagnosis: Stiffness of left shoulder, not elsewhere classified  Muscle weakness (generalized)  Attention and concentration deficit  Unsteadiness on feet  Visuospatial deficit    Problem List Patient Active Problem List   Diagnosis Date Noted  . Acute cerebrovascular accident (CVA) (HCC) 10/26/2019  . Chronic kidney disease, stage III (moderate)   . HFrEF (heart failure with reduced ejection fraction) (HCC) 08/05/2019  . Essential hypertension 07/07/2019  . Nonspecific abnormal electrocardiogram (ECG) (EKG) 07/07/2019    Dammon Makarewicz 12/31/2019, 12:27 PM  [facility] [address] [place], [place], [address] Phone: [phone]   Fax:  [phone]  Name: Albana Saperstein MRN: [id number] Date of Birth: 1955-11-23

## 2019-12-31 NOTE — Patient Outreach (Addendum)
[facility]) Care Management  12/31/2019  Kendra Richardson 1955/07/28 [id number]   Subjective: Telephone call to patient's home / mobile number, spoke with patient, and HIPAA verified.  Patient states she is doing alright, had procedure on 12/23/2019, aneurysm site located, will be schedule for upcoming stent procedure in the near future, and will be hospitalized for this  procedure.  Patient states she does not have any education material, EMMI follow up, care coordination, care management, disease monitoring, transportation, community resource, or pharmacy needs at this time. Patient is aware 30 day EMMI automated call follow up is completed. States she is very appreciative of the follow up and has no care management needs at this time.  States she is aware that she may receive new EMMI automated call after next hospitalization.    Objective:Per KPN (Knowledge Performance Now, point of care tool) and chart review,patient hospitalized 10/26/2019 - 10/28/2019 for acute CVA,Acute thrombotic stroke, left ACA territory infarction. Patient also has a history of chronic systolic heart failure,hypertension,andCKD stage IIIa.    Assessment:Received Medicare EMMI Stroke Red Flag Alert follow up referral on 11/01/2019. Red Flag Alert Triggers, Day #1, times 2, patient answered no to the following question: Scheduled a follow-up appointment?Patient answered yes to the following question:Problems setting up rehab?EMMI follow up completed and no further care management needs.     Plan: RNCM will complete case closure due to follow up completed / no care management needs.  RNCM will send MD case closure letter.       Jennise Both H. Gardiner Barefoot, BSN, CCM [facility] Care Management [facility] Telephonic CM Phone: [phone] Fax: [phone]

## 2020-01-03 ENCOUNTER — Ambulatory Visit: Payer: No Typology Code available for payment source | Admitting: Occupational Therapy

## 2020-01-03 ENCOUNTER — Ambulatory Visit: Payer: No Typology Code available for payment source

## 2020-01-03 ENCOUNTER — Other Ambulatory Visit: Payer: Self-pay

## 2020-01-03 DIAGNOSIS — M25612 Stiffness of left shoulder, not elsewhere classified: Secondary | ICD-10-CM

## 2020-01-03 DIAGNOSIS — R41842 Visuospatial deficit: Secondary | ICD-10-CM

## 2020-01-03 DIAGNOSIS — M6281 Muscle weakness (generalized): Secondary | ICD-10-CM

## 2020-01-03 DIAGNOSIS — R41840 Attention and concentration deficit: Secondary | ICD-10-CM

## 2020-01-03 NOTE — Therapy (Signed)
[facility] [address] [place], [place], [address] Phone: [phone]   Fax:  [phone]  Occupational Therapy Treatment  Patient Details  Name: Kendra Richardson MRN: [id number] Date of Birth: [birth year] Referring Provider (OT): Dr. Harrington Challenger, PCP (Dr. Nigel Bridgeman- hospitalist)   Encounter Date: 01/03/2020   OT End of Session - 01/03/20 1353    Visit Number 12    Number of Visits 25    Date for OT Re-Evaluation 02/08/20    Authorization Type UHC    Authorization Time Period 60 visit limit, each discipline counts as 1 visit, PT at ortho clinic used 10 visits--may need to decrease OT frequency    Authorization - Visit Number 12    Authorization - Number of Visits 20    OT Start Time 1318    OT Stop Time 1400    OT Time Calculation (min) 42 min    Activity Tolerance Patient tolerated treatment well    Behavior During Therapy [facility] for tasks assessed/performed           Past Medical History:  Diagnosis Date  . Abnormal radiographic examination   . CHF (congestive heart failure) ([place])   . Chronic kidney disease, stage III (moderate)   . Chronic kidney disease, stage III (moderate)   . CN (constipation)   . Essential hypertension, malignant   . HTN (hypertension)   . LVH (left ventricular hypertrophy)   . Protein-calorie malnutrition ([place])     Past Surgical History:  Procedure Laterality Date  . BREAST EXCISIONAL BIOPSY Left   . IR 3D INDEPENDENT WKST  12/23/2019  . IR ANGIO INTRA EXTRACRAN SEL COM CAROTID INNOMINATE BILAT MOD SED  12/23/2019  . IR ANGIO VERTEBRAL SEL SUBCLAVIAN INNOMINATE UNI L MOD SED  12/23/2019  . IR ANGIO VERTEBRAL SEL VERTEBRAL UNI R MOD SED  12/23/2019  . IR US GUIDE VASC ACCESS RIGHT  12/23/2019    There were no vitals filed for this visit.   Subjective Assessment - 01/03/20 1325    Subjective  Pt denies pain    Pertinent History Kendra Richardson is a 64 y.o. female  systolic heart failure, CKD 3, hypertension, seen in ED 10/26/19 for evaluation of a syncopal episode. Pt fell and lost consciousness, sustained L eye contusion. 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  PMH: HTN CKD Stage III, CHF, left forzen shoulder per pt report. PTA, pt lived alone and was independent, including driving.    Patient Stated Goals to get back to taking care of her home and self    Currently in Pain? No/denies          Further assessed LTG's and progress thus far. Discussed potential options for after last scheduled visit (placing on hold vs. Discharge, or possible return to ortho P.T. if no further neurological issues)  Pt negotiating through busy gym finding 11/12 items on first pass with no c/o diplopia. Pt retrieving and replacing cones on high shelf LUE with repetition and min compensations.  Seated: bilateral shoulder flexion, LUE sh abduction x 10 reps each with cane. Supine: ER/IR with arms abducted x 10 w/ mild pain during IR d/t tightness. Standing: bilateral IR w/ cane behind followed by towel stretch bilaterally for IR/ER. UBE x 8 min, level 3.  OT Short Term Goals - 12/03/19 1239      OT SHORT TERM GOAL #1   Title I with inital HEP.    Time 6    Period Weeks    Status Achieved    Target Date 12/25/19      OT SHORT TERM GOAL #2   Title Pt will demonstrate 110 shoulder flexion in prep for functional reach    Baseline 100    Time 6    Period Weeks    Status Achieved   12/03/19:  110*     OT SHORT TERM GOAL #3   Title Pt will verbalize understanding of compensatory strategies for visual impairments.    Time 6    Period Weeks    Status Deferred   12/03/19:  100% accuarcy with environmental scanning in busy environment, no complaints of diplopia or dizzness.     OT SHORT TERM GOAL #4   Title Pt will perfrom basic  home managment/ cooking with supervision demonstrating good safety awareness.    Time 6    Period Weeks    Status Achieved   12/03/19:  per pt report     OT SHORT TERM GOAL #5   Title Further assess cognition in a functional context and set goals as needed.    Time 6    Period Weeks    Status Deferred   12/03/19:  appears WFL for functional tasks in clinic            OT Long Term Goals - 01/03/20 1354      OT LONG TERM GOAL #1   Title I with updated HEP    Time 12    Period Weeks    Status On-going      OT LONG TERM GOAL #2   Title Pt will retrieve a lightweight object at 115 shoulder flexion with LUE.    Baseline 100    Time 12    Period Weeks    Status Achieved      OT LONG TERM GOAL #3   Title Pt will perform mod complex home managment/ cooking modified independently.    Time 12    Period Weeks    Status Achieved   Pt reports she cooked a full meal without any problems, pt has been perfroming cleaning     OT LONG TERM GOAL #4   Title Pt will demonstrate ability to perform a physical and cogntive task simultaneously with 90% or better accuracy in prep for driving.    Time 12    Period Weeks    Status Achieved   amb, toosing ball and performing catergory generation with 90% or better accuracy     OT LONG TERM GOAL #5   Title Pt will navigate a busy environment and locate itmes with 90% or better accuracy and no reports of double vison.    Time 12    Period Weeks    Status Achieved   12/03/19:  100% with no reports of diplopia     OT LONG TERM GOAL #6   Title Pt will resume planting flowers modified independently.    Time 12    Period Weeks    Status Achieved                 Plan - 01/03/20 1400    Clinical Impression Statement Pt progressing and has met 5/6 LTG's at this time.    Occupational performance deficits (Please refer to evaluation for details): ADL's;IADL's;Leisure;Social Participation  Body Structure / Function / Physical Skills  ADL;Endurance;UE functional use;Balance;Flexibility;Pain;FMC;ROM;Coordination;Decreased knowledge of precautions;Decreased knowledge of use of DME;IADL;Strength;Mobility    Cognitive Skills Attention;Memory;Safety Awareness;Understand;Thought    OT Frequency 2x / week    OT Duration 12 weeks    OT Treatment/Interventions Self-care/ADL training;Ultrasound;Energy conservation;Visual/perceptual remediation/compensation;Patient/family education;Aquatic Therapy;DME and/or AE instruction;Balance training;Passive range of motion;Paraffin;Cryotherapy;Fluidtherapy;Splinting;Functional Mobility Training;Electrical Stimulation;Moist Heat;Therapeutic exercise;Manual Therapy;Therapeutic activities;Cognitive remediation/compensation;Neuromuscular education    Plan check remaining goal and consider placing on hold? until after procedure    Consulted and Agree with Plan of Care Patient           Patient will benefit from skilled therapeutic intervention in order to improve the following deficits and impairments:   Body Structure / Function / Physical Skills: ADL, Endurance, UE functional use, Balance, Flexibility, Pain, FMC, ROM, Coordination, Decreased knowledge of precautions, Decreased knowledge of use of DME, IADL, Strength, Mobility Cognitive Skills: Attention, Memory, Safety Awareness, Understand, Thought     Visit Diagnosis: Stiffness of left shoulder, not elsewhere classified  Muscle weakness (generalized)  Attention and concentration deficit  Visuospatial deficit    Problem List Patient Active Problem List   Diagnosis Date Noted  . Acute cerebrovascular accident (CVA) ([place]) 10/26/2019  . Chronic kidney disease, stage III (moderate)   . HFrEF (heart failure with reduced ejection fraction) ([place]) 08/05/2019  . Essential hypertension 07/07/2019  . Nonspecific abnormal electrocardiogram (ECG) (EKG) 07/07/2019    Carey Bullocks, OTR/L 01/03/2020, 2:08 PM  [facility] [address] [place], [place], [address] Phone: [phone]   Fax:  [phone]  Name: Mitra Duling MRN: [id number] Date of Birth: 11-13-1955

## 2020-01-06 ENCOUNTER — Ambulatory Visit: Payer: No Typology Code available for payment source | Admitting: Occupational Therapy

## 2020-01-06 ENCOUNTER — Other Ambulatory Visit: Payer: Self-pay

## 2020-01-06 DIAGNOSIS — M25612 Stiffness of left shoulder, not elsewhere classified: Secondary | ICD-10-CM | POA: Diagnosis not present

## 2020-01-06 DIAGNOSIS — M6281 Muscle weakness (generalized): Secondary | ICD-10-CM

## 2020-01-06 NOTE — Therapy (Signed)
[facility] [address] [place], [place], [address] Phone: [phone]   Fax:  [phone]  Occupational Therapy Treatment  Patient Details  Name: Kendra Richardson MRN: [id number] Date of Birth: 16-Dec-1955 Referring Provider (OT): Dr. Harrington Challenger, PCP (Dr. Nigel Bridgeman- hospitalist)   Encounter Date: 01/06/2020   OT End of Session - 01/06/20 1507    Visit Number 13    Number of Visits 25    Date for OT Re-Evaluation 02/08/20    Authorization Type [employer]    Authorization Time Period 60 visit limit, each discipline counts as 1 visit, PT at ortho clinic used 10 visits--may need to decrease OT frequency    Authorization - Visit Number 13    Authorization - Number of Visits 20    OT Start Time 1400    OT Stop Time 1438    OT Time Calculation (min) 38 min    Activity Tolerance Patient tolerated treatment well    Behavior During Therapy [facility] for tasks assessed/performed           Past Medical History:  Diagnosis Date  . Abnormal radiographic examination   . CHF (congestive heart failure) ([place])   . Chronic kidney disease, stage III (moderate)   . Chronic kidney disease, stage III (moderate)   . CN (constipation)   . Essential hypertension, malignant   . HTN (hypertension)   . LVH (left ventricular hypertrophy)   . Protein-calorie malnutrition ([place])     Past Surgical History:  Procedure Laterality Date  . BREAST EXCISIONAL BIOPSY Left   . IR 3D INDEPENDENT WKST  12/23/2019  . IR ANGIO INTRA EXTRACRAN SEL COM CAROTID INNOMINATE BILAT MOD SED  12/23/2019  . IR ANGIO VERTEBRAL SEL SUBCLAVIAN INNOMINATE UNI L MOD SED  12/23/2019  . IR ANGIO VERTEBRAL SEL VERTEBRAL UNI R MOD SED  12/23/2019  . IR US GUIDE VASC ACCESS RIGHT  12/23/2019    There were no vitals filed for this visit.   Subjective Assessment - 01/06/20 1403    Subjective  Pt denies pain    Pertinent History Kendra Richardson is a 64 y.o. female  systolic heart failure, CKD 3, hypertension, seen in ED 10/26/19 for evaluation of a syncopal episode. Pt fell and lost consciousness, sustained L eye contusion. 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  PMH: HTN CKD Stage III, CHF, left forzen shoulder per pt report. PTA, pt lived alone and was independent, including driving.    Patient Stated Goals to get back to taking care of her home and self    Currently in Pain? No/denies           Reviewed strengthening ex's from most recent HEP - pt performed each x 10-15 reps supine. Worked on circumduction ex's at 90* flexion in supine with 1 lb weight. Pt also shown butterfly stretch to increase Sh ER. Wall slides BUE's for ROM w/ min compensations at trunk and LUE. Wall push ups x 10 reps. UBE x 8 min, level 3 Assessed remaining LTG and discussed plan (see below plan)                       OT Short Term Goals - 12/03/19 1239      OT SHORT TERM GOAL #1   Title I with inital HEP.    Time 6    Period Weeks  Status Achieved    Target Date 12/25/19      OT SHORT TERM GOAL #2   Title Pt will demonstrate 110 shoulder flexion in prep for functional reach    Baseline 100    Time 6    Period Weeks    Status Achieved   12/03/19:  110*     OT SHORT TERM GOAL #3   Title Pt will verbalize understanding of compensatory strategies for visual impairments.    Time 6    Period Weeks    Status Deferred   12/03/19:  100% accuarcy with environmental scanning in busy environment, no complaints of diplopia or dizzness.     OT SHORT TERM GOAL #4   Title Pt will perfrom basic home managment/ cooking with supervision demonstrating good safety awareness.    Time 6    Period Weeks    Status Achieved   12/03/19:  per pt report     OT SHORT TERM GOAL #5   Title Further assess cognition in a functional context and set goals as needed.     Time 6    Period Weeks    Status Deferred   12/03/19:  appears WFL for functional tasks in clinic            OT Long Term Goals - 01/06/20 1508      OT LONG TERM GOAL #1   Title I with updated HEP    Time 12    Period Weeks    Status Achieved      OT LONG TERM GOAL #2   Title Pt will retrieve a lightweight object at 115 shoulder flexion with LUE.    Baseline 100    Time 12    Period Weeks    Status Achieved      OT LONG TERM GOAL #3   Title Pt will perform mod complex home managment/ cooking modified independently.    Time 12    Period Weeks    Status Achieved   Pt reports she cooked a full meal without any problems, pt has been perfroming cleaning     OT LONG TERM GOAL #4   Title Pt will demonstrate ability to perform a physical and cogntive task simultaneously with 90% or better accuracy in prep for driving.    Time 12    Period Weeks    Status Achieved   amb, toosing ball and performing catergory generation with 90% or better accuracy     OT LONG TERM GOAL #5   Title Pt will navigate a busy environment and locate itmes with 90% or better accuracy and no reports of double vison.    Time 12    Period Weeks    Status Achieved   12/03/19:  100% with no reports of diplopia     OT LONG TERM GOAL #6   Title Pt will resume planting flowers modified independently.    Time 12    Period Weeks    Status Achieved                 Plan - 01/06/20 1509    Clinical Impression Statement Pt has met all STG's and LTG's    Occupational performance deficits (Please refer to evaluation for details): ADL's;IADL's;Leisure;Social Participation    Body Structure / Function / Physical Skills ADL;Endurance;UE functional use;Balance;Flexibility;Pain;FMC;ROM;Coordination;Decreased knowledge of precautions;Decreased knowledge of use of DME;IADL;Strength;Mobility    Cognitive Skills Attention;Memory;Safety Awareness;Understand;Thought    Comorbidities Affecting Occupational Performance:  May have comorbidities impacting  occupational performance    OT Frequency 2x / week    OT Duration 12 weeks    OT Treatment/Interventions Self-care/ADL training;Ultrasound;Energy conservation;Visual/perceptual remediation/compensation;Patient/family education;Aquatic Therapy;DME and/or AE instruction;Balance training;Passive range of motion;Paraffin;Cryotherapy;Fluidtherapy;Splinting;Functional Mobility Training;Electrical Stimulation;Moist Heat;Therapeutic exercise;Manual Therapy;Therapeutic activities;Cognitive remediation/compensation;Neuromuscular education    Plan place on hold until after procedure - pt does not need to return to neuro clinic if no changes after procedure, but recommended pt return to orthopedic clinic to continue shoulder ROM deficits. Will d/c if no further neuro needs    Consulted and Agree with Plan of Care Patient           Patient will benefit from skilled therapeutic intervention in order to improve the following deficits and impairments:   Body Structure / Function / Physical Skills: ADL, Endurance, UE functional use, Balance, Flexibility, Pain, FMC, ROM, Coordination, Decreased knowledge of precautions, Decreased knowledge of use of DME, IADL, Strength, Mobility Cognitive Skills: Attention, Memory, Safety Awareness, Understand, Thought     Visit Diagnosis: Stiffness of left shoulder, not elsewhere classified  Muscle weakness (generalized)    Problem List Patient Active Problem List   Diagnosis Date Noted  . Acute cerebrovascular accident (CVA) ([place]) 10/26/2019  . Chronic kidney disease, stage III (moderate)   . HFrEF (heart failure with reduced ejection fraction) ([place]) 08/05/2019  . Essential hypertension 07/07/2019  . Nonspecific abnormal electrocardiogram (ECG) (E[facility]) 07/07/2019    Carey Bullocks, OTR/L 01/06/2020, 3:13 PM  [facility] [address] [place], [place],  [address] Phone: [phone]   Fax:  [phone]  Name: Kendra Richardson MRN: [id number] Date of Birth: 1956/01/26

## 2020-01-07 ENCOUNTER — Other Ambulatory Visit (HOSPITAL_COMMUNITY): Payer: Self-pay | Admitting: Interventional Radiology

## 2020-01-07 DIAGNOSIS — I671 Cerebral aneurysm, nonruptured: Secondary | ICD-10-CM

## 2020-01-15 ENCOUNTER — Other Ambulatory Visit (HOSPITAL_COMMUNITY)
Admission: RE | Admit: 2020-01-15 | Discharge: 2020-01-15 | Disposition: A | Discharge status: Home / Self Care | Payer: No Typology Code available for payment source | Source: Ambulatory Visit | Attending: Interventional Radiology | Admitting: Interventional Radiology

## 2020-01-15 DIAGNOSIS — Z20822 Contact with and (suspected) exposure to covid-19: Secondary | ICD-10-CM | POA: Insufficient documentation

## 2020-01-15 DIAGNOSIS — Z01812 Encounter for preprocedural laboratory examination: Secondary | ICD-10-CM | POA: Insufficient documentation

## 2020-01-15 LAB — SARS CORONAVIRUS 2 (TAT 6-24 HRS): SARS Coronavirus 2: NEGATIVE

## 2020-01-18 ENCOUNTER — Telehealth (HOSPITAL_COMMUNITY): Payer: Self-pay | Admitting: Radiology

## 2020-01-18 ENCOUNTER — Other Ambulatory Visit: Payer: Self-pay | Admitting: Radiology

## 2020-01-18 ENCOUNTER — Other Ambulatory Visit (HOSPITAL_COMMUNITY): Payer: Self-pay | Admitting: Radiology

## 2020-01-18 DIAGNOSIS — M25612 Stiffness of left shoulder, not elsewhere classified: Secondary | ICD-10-CM | POA: Diagnosis not present

## 2020-01-18 DIAGNOSIS — I639 Cerebral infarction, unspecified: Secondary | ICD-10-CM

## 2020-01-18 NOTE — Progress Notes (Signed)
Pre-op instructions given only. Please complete pt assessment on DOS. Pt made aware to stop taking vitamins, fish oil and herbal medications. Do not take any NSAIDs ie: Ibuprofen, Advil, Naproxen (Aleve), Motrin, BC and Goody Powder. Pt reminded to continue to quarantine. Pt verbalized understanding of all pre-op instructions.

## 2020-01-18 NOTE — Telephone Encounter (Signed)
Called [facility] for patient's P2Y12 results. Per the lab result is 163. They will fax a copy of the lab to our office. JM

## 2020-01-19 ENCOUNTER — Ambulatory Visit (HOSPITAL_COMMUNITY): Admission: RE | Admit: 2020-01-19 | Payer: No Typology Code available for payment source | Source: Ambulatory Visit

## 2020-01-19 ENCOUNTER — Ambulatory Visit (HOSPITAL_COMMUNITY)
Admission: RE | Admit: 2020-01-19 | Discharge: 2020-01-19 | Disposition: A | Discharge status: Home / Self Care | Payer: No Typology Code available for payment source | Source: Ambulatory Visit | Attending: Interventional Radiology | Admitting: Interventional Radiology

## 2020-01-19 ENCOUNTER — Encounter (HOSPITAL_COMMUNITY): Payer: Self-pay | Admitting: Interventional Radiology

## 2020-01-19 ENCOUNTER — Observation Stay (HOSPITAL_COMMUNITY)
Admission: AD | Admit: 2020-01-19 | Discharge: 2020-01-20 | Disposition: A | Discharge status: Home / Self Care | Payer: No Typology Code available for payment source | Attending: Interventional Radiology | Admitting: Interventional Radiology

## 2020-01-19 ENCOUNTER — Other Ambulatory Visit: Payer: Self-pay

## 2020-01-19 ENCOUNTER — Encounter (HOSPITAL_COMMUNITY)
Admission: AD | Disposition: A | Discharge status: Home / Self Care | Payer: Self-pay | Source: Home / Self Care | Attending: Interventional Radiology

## 2020-01-19 ENCOUNTER — Ambulatory Visit (HOSPITAL_COMMUNITY): Payer: No Typology Code available for payment source | Admitting: Certified Registered Nurse Anesthetist

## 2020-01-19 ENCOUNTER — Encounter (HOSPITAL_COMMUNITY): Payer: Self-pay

## 2020-01-19 DIAGNOSIS — I130 Hypertensive heart and chronic kidney disease with heart failure and stage 1 through stage 4 chronic kidney disease, or unspecified chronic kidney disease: Secondary | ICD-10-CM | POA: Diagnosis not present

## 2020-01-19 DIAGNOSIS — Z87891 Personal history of nicotine dependence: Secondary | ICD-10-CM | POA: Insufficient documentation

## 2020-01-19 DIAGNOSIS — I509 Heart failure, unspecified: Secondary | ICD-10-CM | POA: Insufficient documentation

## 2020-01-19 DIAGNOSIS — Z841 Family history of disorders of kidney and ureter: Secondary | ICD-10-CM | POA: Diagnosis not present

## 2020-01-19 DIAGNOSIS — Z7982 Long term (current) use of aspirin: Secondary | ICD-10-CM | POA: Diagnosis not present

## 2020-01-19 DIAGNOSIS — Z7902 Long term (current) use of antithrombotics/antiplatelets: Secondary | ICD-10-CM | POA: Diagnosis not present

## 2020-01-19 DIAGNOSIS — I671 Cerebral aneurysm, nonruptured: Secondary | ICD-10-CM

## 2020-01-19 DIAGNOSIS — N183 Chronic kidney disease, stage 3 unspecified: Secondary | ICD-10-CM | POA: Insufficient documentation

## 2020-01-19 DIAGNOSIS — Z79899 Other long term (current) drug therapy: Secondary | ICD-10-CM | POA: Diagnosis not present

## 2020-01-19 DIAGNOSIS — Z823 Family history of stroke: Secondary | ICD-10-CM | POA: Insufficient documentation

## 2020-01-19 DIAGNOSIS — Z8673 Personal history of transient ischemic attack (TIA), and cerebral infarction without residual deficits: Secondary | ICD-10-CM | POA: Insufficient documentation

## 2020-01-19 HISTORY — DX: Other specified postprocedural states: Z98.890

## 2020-01-19 HISTORY — PX: RADIOLOGY WITH ANESTHESIA: SHX6223

## 2020-01-19 HISTORY — DX: Other specified postprocedural states: R11.2

## 2020-01-19 HISTORY — DX: Other complications of anesthesia, initial encounter: T88.59XA

## 2020-01-19 LAB — URINALYSIS, COMPLETE (UACMP) WITH MICROSCOPIC
Bilirubin Urine: NEGATIVE
Glucose, UA: NEGATIVE mg/dL
Hgb urine dipstick: NEGATIVE
Ketones, ur: NEGATIVE mg/dL
Nitrite: NEGATIVE
Protein, ur: NEGATIVE mg/dL
Specific Gravity, Urine: 1.011 (ref 1.005–1.030)
pH: 7 (ref 5.0–8.0)

## 2020-01-19 LAB — CBC WITH DIFFERENTIAL/PLATELET
Abs Immature Granulocytes: 0.01 10*3/uL (ref 0.00–0.07)
Basophils Absolute: 0 10*3/uL (ref 0.0–0.1)
Basophils Relative: 1 %
Eosinophils Absolute: 0.1 10*3/uL (ref 0.0–0.5)
Eosinophils Relative: 3 %
HCT: 42.1 % (ref 36.0–46.0)
Hemoglobin: 13.4 g/dL (ref 12.0–15.0)
Immature Granulocytes: 0 %
Lymphocytes Relative: 26 %
Lymphs Abs: 1.1 10*3/uL (ref 0.7–4.0)
MCH: 27.8 pg (ref 26.0–34.0)
MCHC: 31.8 g/dL (ref 30.0–36.0)
MCV: 87.3 fL (ref 80.0–100.0)
Monocytes Absolute: 0.4 10*3/uL (ref 0.1–1.0)
Monocytes Relative: 10 %
Neutro Abs: 2.6 10*3/uL (ref 1.7–7.7)
Neutrophils Relative %: 60 %
Platelets: 192 10*3/uL (ref 150–400)
RBC: 4.82 MIL/uL (ref 3.87–5.11)
RDW: 12.2 % (ref 11.5–15.5)
WBC: 4.3 10*3/uL (ref 4.0–10.5)
nRBC: 0 % (ref 0.0–0.2)

## 2020-01-19 LAB — PROTIME-INR
INR: 1 (ref 0.8–1.2)
Prothrombin Time: 13 seconds (ref 11.4–15.2)

## 2020-01-19 LAB — PLATELET INHIBITION P2Y12: Platelet Function  P2Y12: 176 [PRU] — ABNORMAL LOW (ref 182–335)

## 2020-01-19 LAB — MRSA PCR SCREENING: MRSA by PCR: NEGATIVE

## 2020-01-19 LAB — POCT ACTIVATED CLOTTING TIME: Activated Clotting Time: 208 seconds

## 2020-01-19 IMAGING — XA IR TRANSCATH EMBOLIZATION
11 of 13 series · 11 of 24 positions shown · non-contrast
Comparison: Diagnostic catheter arteriogram [DATE].

CLINICAL DATA: Recent history of stroke. Headaches. Workup revealed
the presence of left internal carotid artery posterior communicating
artery region aneurysm.

EXAM:
TRANSCATHETER THERAPY EMBOLIZATION
TECHNIQUE: Informed written consent was obtained from the patient after a
thorough discussion of the procedural risks, benefits and
alternatives. All questions were addressed. Maximal Sterile Barrier
Technique was utilized including caps, mask, sterile gowns, sterile
gloves, sterile drape, hand hygiene and skin antiseptic. A timeout
was performed prior to the initiation of the procedure.

[Series 1: cerebral care 2 · 2 acquisitions, 1 frame shown]
[im 1/2]
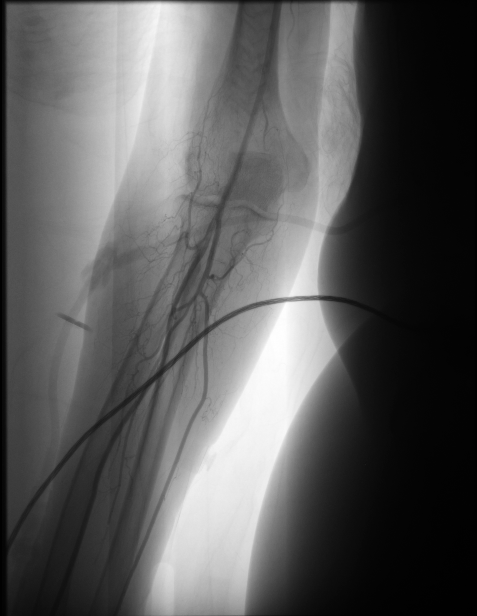

[Series 3: cerebral · 7 acquisitions, 1 frame shown (1 of 10)]
[im 1/7]
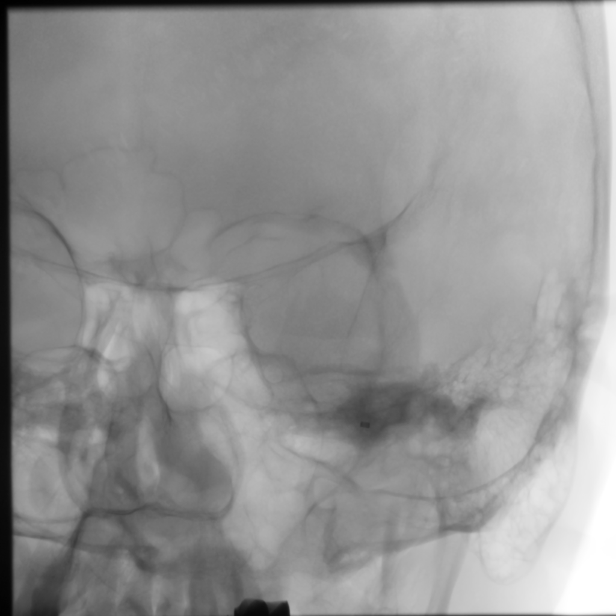

[Series 5: cerebral · 4 acquisitions, 1 frame shown (2 of 10)]
[im 1/4]
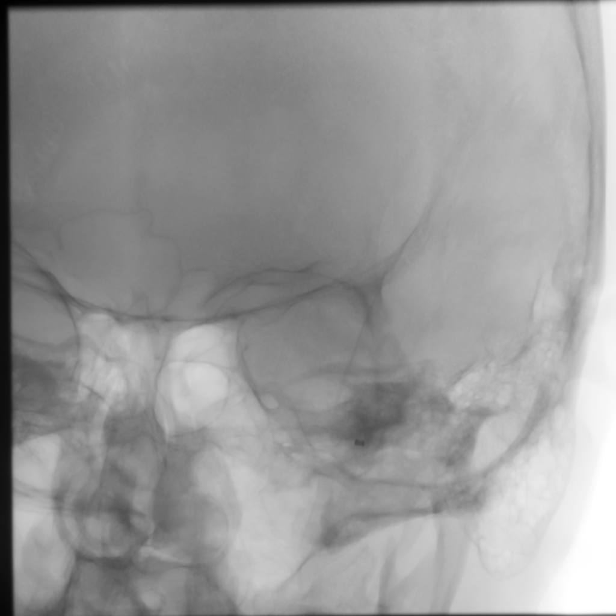

[Series 6: cerebral · 4 acquisitions, 1 frame shown (3 of 10)]
[im 1/4]
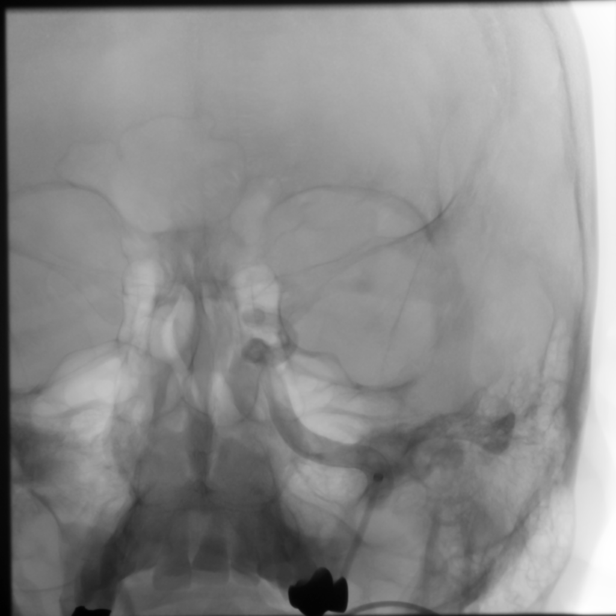

[Series 7: cerebral · 4 acquisitions, 1 frame shown (4 of 10)]
[im 1/4]
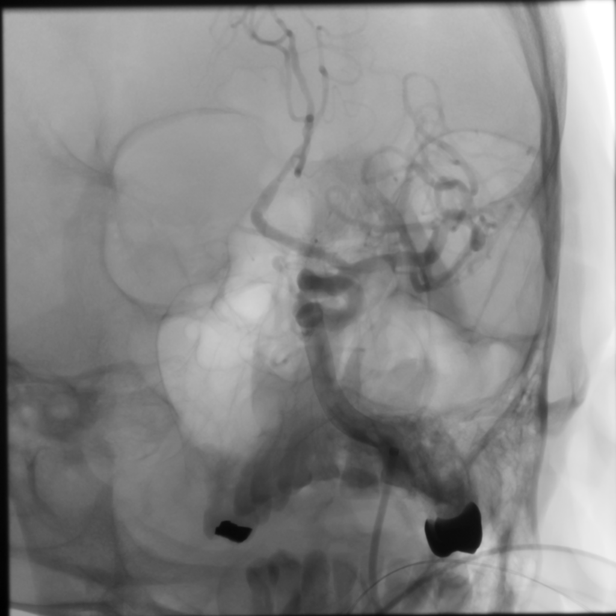

[Series 9: cerebral · 4 acquisitions, 1 frame shown (5 of 10)]
[im 1/4]
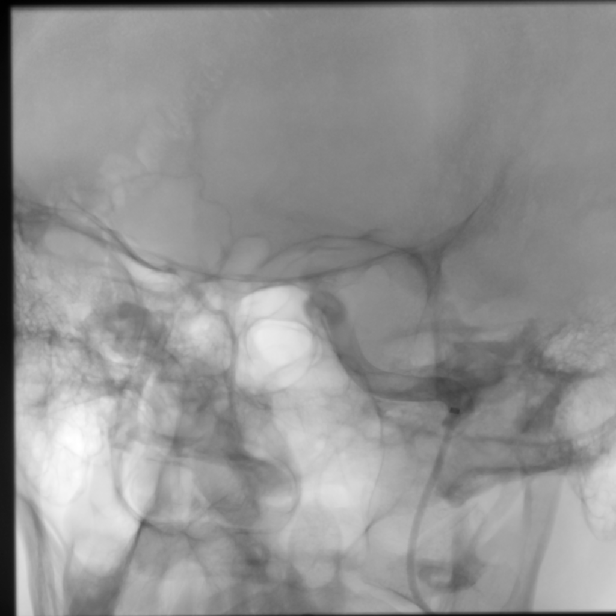

[Series 10: cerebral · 4 acquisitions, 1 frame shown (6 of 10)]
[im 1/4]
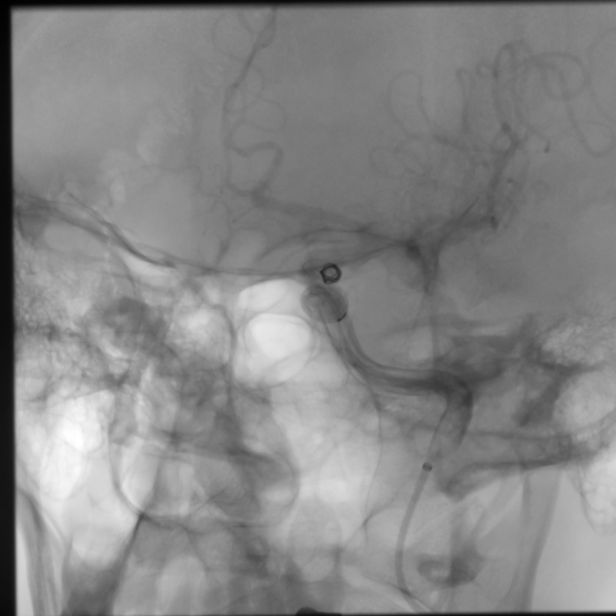

[Series 11: cerebral · 4 acquisitions, 1 frame shown (7 of 10)]
[im 1/4]
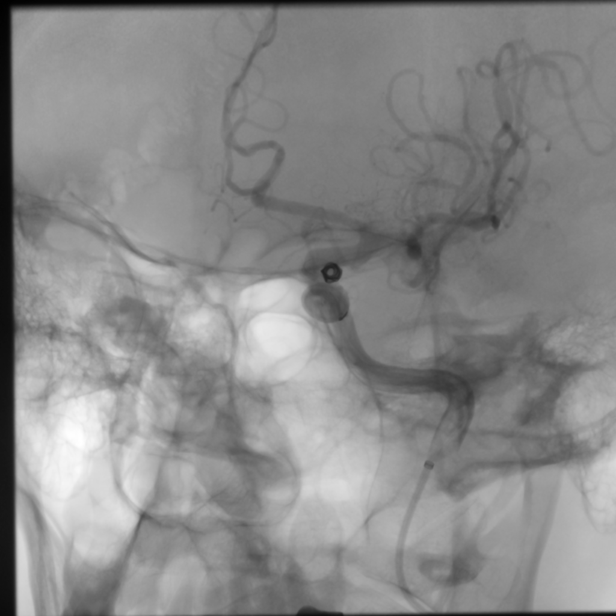

[Series 12: cerebral · 4 acquisitions, 1 frame shown (8 of 10)]
[im 1/4]
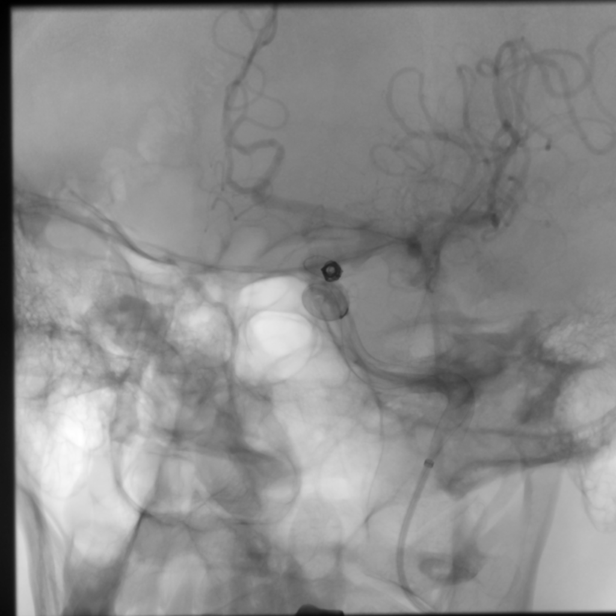

[Series 13: cerebral · 4 acquisitions, 1 frame shown (9 of 10)]
[im 1/4]
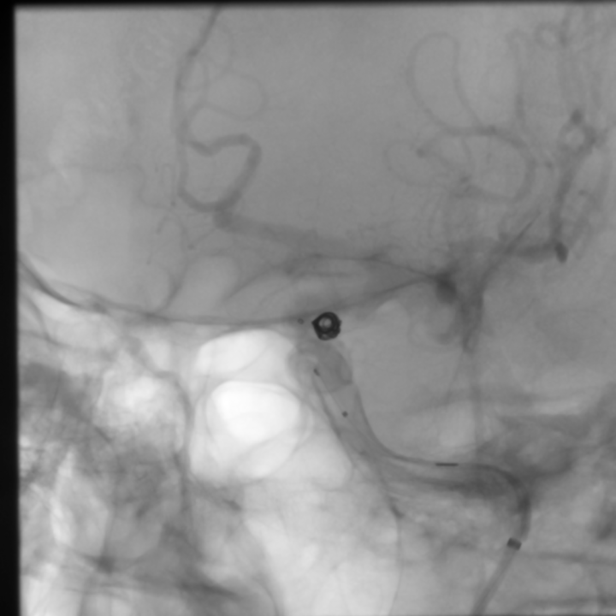

[Series 14: cerebral · 8 acquisitions, 1 frame shown (10 of 10)]
[im 1/8]
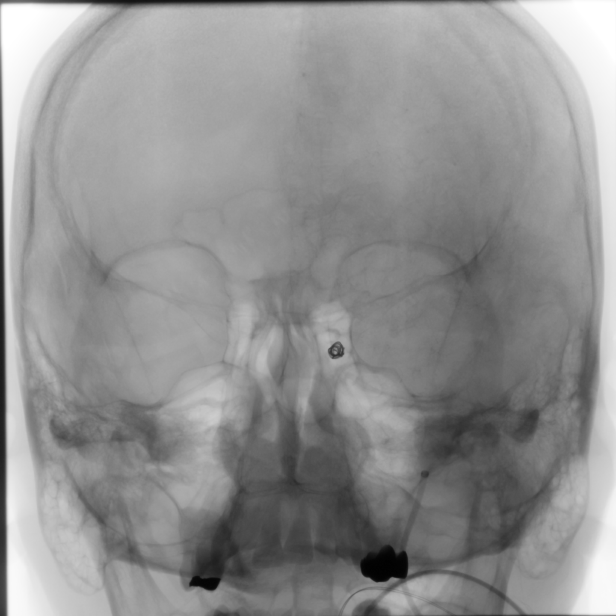

[11 of 24 positions shown; findings below may reference images not displayed]

MEDICATIONS:
Heparin 3,000 units IV. Ancef 2 g IV antibiotic was administered
within 1 hour of the procedure.

ANESTHESIA/SEDATION:
General anesthesia.

CONTRAST:  Isovue 300 approximately 70 mL

FLUOROSCOPY TIME:  Fluoroscopy Time: 40 minutes 12 seconds (763
mGy).

COMPLICATIONS:
None immediate.
The right forearm to the wrist was prepped and draped in the usual
sterile manner. The right radial artery was then identified and its
morphology was then verified with ultrasound and documented. A
dorsal palmar anastomosis was verified to be present. Using
ultrasound guidance, and over a 0.018 inch micro guidewire, a [DATE]
French radial sheath was then inserted without difficulty. The micro
guidewire, and the obturator were removed. Good aspiration was
obtained from the side port of the radial sheath. A cocktail of [KK]
units of heparin, 2.5 mg of verapamil, and 200 mcg of nitroglycerin
was then injected through the sheath without event. A right radial
arteriogram was obtained.

Over a 0.035 inch Roadrunner guidewire, a combination of a 105 cm 7
French [REDACTED] sheath inside of which was a 5 CEEJAY 2
diagnostic support catheter was advanced to the aortic arch region.
Access was obtained into the left common carotid artery with the
catheter, catheter over the 0.035 inch Roadrunner guidewire followed
by the advancement of the [REDACTED] radial sheath which was advanced to
the left common carotid bifurcation. CEEJAY 2 catheter and the wire
were removed. Good aspiration obtained from radial [REDACTED] sheath.

Control arteriogram was then performed over the left common carotid
bifurcation, and the left anterior circulation.
FINDINGS: The left common carotid bifurcation demonstrates the left external
carotid artery and its major branches to be widely patent.

The left internal carotid artery at the bulb to the cranial skull
base demonstrates wide patency.

The petrous, the cavernous and the supraclinoid segments are widely
patent with an approximately 50% stenosis of the distal cavernous
segment of the left internal carotid artery secondary to a smooth
atherosclerotic plaque along the anterior wall of the internal
carotid artery.

Again demonstrated is an approximately 4.5 mm x 3.5 mm posterior
communicating artery aneurysm projecting inferiorly and laterally.
This has a moderate sized neck with the PCOM origin separate from
the neck of the aneurysm.

The left middle cerebral artery and the left anterior cerebral
artery opacify into the capillary and venous phases.

Mild to moderate intracranial arteriosclerotic changes are noted in
the left middle cerebral artery inferior division, and left anterior
cerebral artery pericallosal branch in the A3 region.

Over a 0.035 inch Roadrunner guidewire, the 7 French sheath was
advanced to the cervical petrous junction.

The guidewire was removed. Good aspiration obtained from the hub of
[REDACTED] guide sheath. A control arteriogram performed through this
continued to demonstrate no change in the intracranial circulation.

A Headway 2 tip 017 microcatheter was then used with slight J
curvature.

It was then advanced over a 0.14 inch standard Synchro micro
guidewire with a J-tip configuration to the cavernous segment of the
left internal carotid artery.

Using biplane roadmap technique and constant fluoroscopic guidance,
and a torque device, access into the aneurysm was obtained with the
micro guidewire followed by the microcatheter. The guidewire was
removed. Good aspiration obtained from the intra aneurysmal
microcatheter. This was then connected to continuous heparinized
saline infusion.

Embolization was then ready to begin.

The first coil utilized was a 4 mm x 6 cm Target 360 Soft coil. This
was purged with heparinized saline infusion in its housing.

It was then advanced in a coaxial manner and with constant
heparinized infusion to the tip of the intra aneurysmal
microcatheter.

It was then advanced into aneurysm with a stable frame being formed.
A control arteriogram performed through the guide sheath
demonstrated safe positioning of the coil to be detached. This was
then detached. This was subsequently followed by the advancement of
a 2 mm x 3 cm Target Helical Ultra Soft coil, and a 2 mm x 2 cm
Micro Plex stent coil. Each of these coils was advanced into the
aneurysm using biplane roadmap technique and constant fluoroscopic
guidance. Prior to the detachment, a control arteriogram was
performed to ensure stability of the coil mass.

On the final coil, the microcatheter was then gently retrieved from
the coil mass without any change in the coil mass.

A control arteriogram performed through the guide sheath in the left
internal carotid artery continued to demonstrate near complete
obliteration of the aneurysm with patency of the left posterior
communicating artery.

A control arteriogram perfor[REDACTED]ed intracranially demonstrated
no change in the intracranial circulation. No evidence of
intraluminal filling defects or of occlusion was seen.

The guide sheath was retrieved and removed. Hemostasis at the right
radial puncture site was achieved with a wrist band. Distal right
radial pulse was verified to be present.

During this time the patient's ACT was maintained in the region of
200 seconds.

Patient was then extubated without difficulty. Upon recovery, the
patient reported no evidence of headaches, nausea or vomiting or
visual or motor or sensory symptoms.

She was then transferred to the PACU and then neuro ICU. Overnight
neurologically the patient remained stable. She was able to enjoy
clear liquids. Neurologically she remained stable. The wrist
puncture site was also soft. Distal radial pulse was intact.

She was then mobilized without difficulty. She was discharged home
under the care of her sister with special instructions to maintain
adequate hydration and to continue taking aspirin 325 mg a day and
to stop the Plavix. She was advised to resume her home meds as
prescribed by her primary care. She was advised to refrain from
stooping, bending or lifting weights above 10 pounds for 2 weeks.
Additionally she was advised to refrain from driving.

She expressed understanding and agreement with the above management
plan.
IMPRESSION: Status post endovascular primary coiling of left posterior
communicating artery aneurysm with near complete obliteration.

PLAN:
Follow-up in the clinic 2-4 weeks post discharge.

## 2020-01-19 IMAGING — US IR TRANSCATH EMBOLIZATION
1 series · 4 of 4 positions shown · non-contrast
Comparison: Diagnostic catheter arteriogram [DATE].

CLINICAL DATA: Recent history of stroke. Headaches. Workup revealed
the presence of left internal carotid artery posterior communicating
artery region aneurysm.

EXAM:
TRANSCATHETER THERAPY EMBOLIZATION
TECHNIQUE: Informed written consent was obtained from the patient after a
thorough discussion of the procedural risks, benefits and
alternatives. All questions were addressed. Maximal Sterile Barrier
Technique was utilized including caps, mask, sterile gowns, sterile
gloves, sterile drape, hand hygiene and skin antiseptic. A timeout
was performed prior to the initiation of the procedure.

[Series 1: (id) · 4 of 4 slices shown]
[im 1/4]
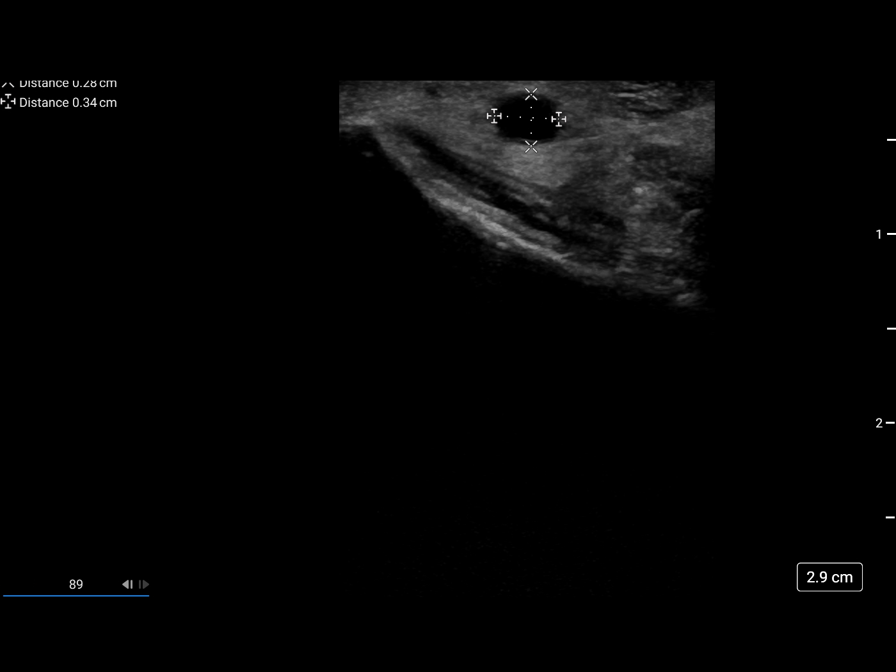
[im 2/4]
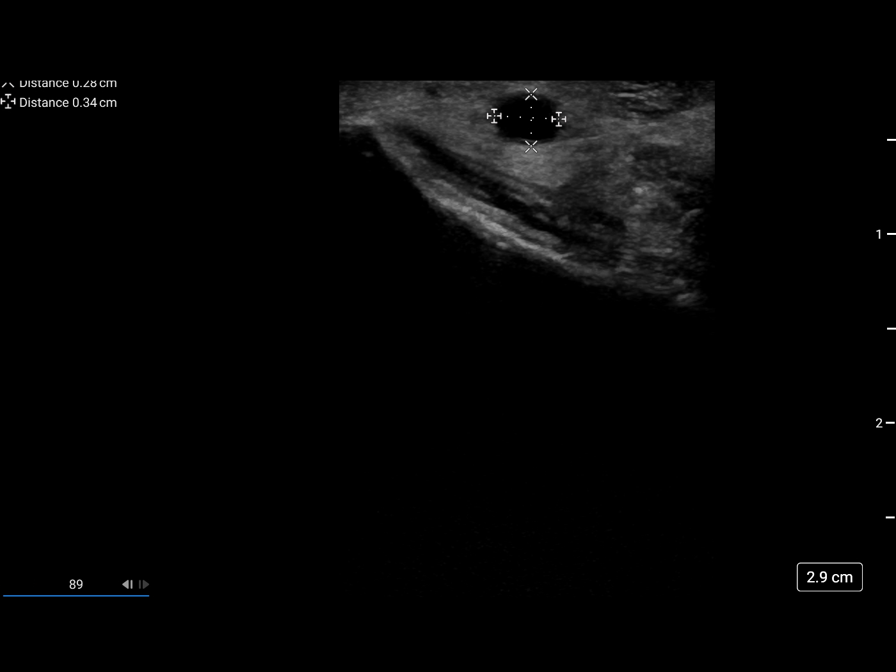
[im 3/4]
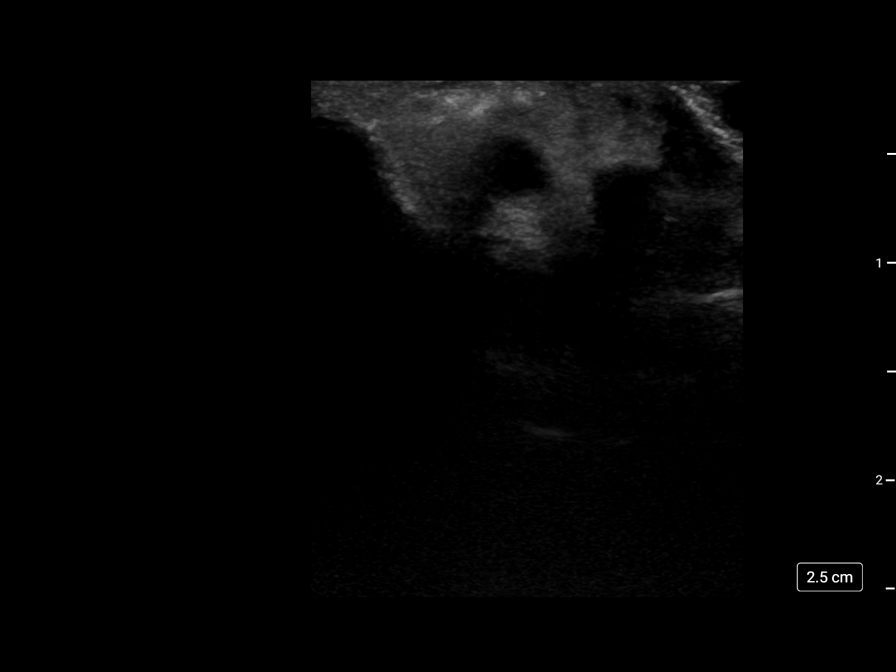
[im 4/4]
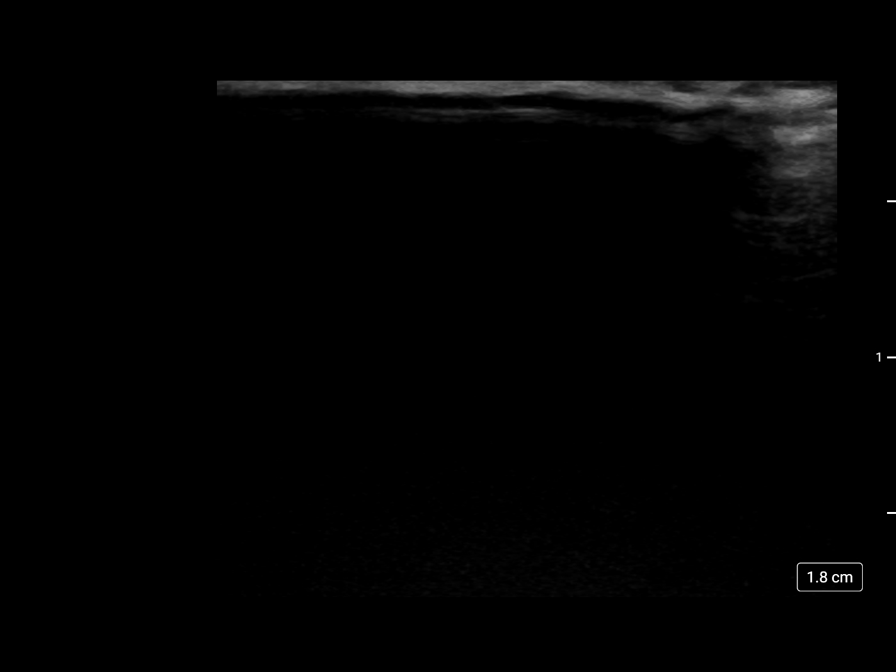

[4 of 4 positions shown; findings below may reference images not displayed]

MEDICATIONS:
Heparin 3,000 units IV. Ancef 2 g IV antibiotic was administered
within 1 hour of the procedure.

ANESTHESIA/SEDATION:
General anesthesia.

CONTRAST:  Isovue 300 approximately 70 mL

FLUOROSCOPY TIME:  Fluoroscopy Time: 40 minutes 12 seconds (763
mGy).

COMPLICATIONS:
None immediate.
The right forearm to the wrist was prepped and draped in the usual
sterile manner. The right radial artery was then identified and its
morphology was then verified with ultrasound and documented. A
dorsal palmar anastomosis was verified to be present. Using
ultrasound guidance, and over a 0.018 inch micro guidewire, a [DATE]
French radial sheath was then inserted without difficulty. The micro
guidewire, and the obturator were removed. Good aspiration was
obtained from the side port of the radial sheath. A cocktail of [KK]
units of heparin, 2.5 mg of verapamil, and 200 mcg of nitroglycerin
was then injected through the sheath without event. A right radial
arteriogram was obtained.

Over a 0.035 inch Roadrunner guidewire, a combination of a 105 cm 7
French [REDACTED] sheath inside of which was a 5 CEEJAY 2
diagnostic support catheter was advanced to the aortic arch region.
Access was obtained into the left common carotid artery with the
catheter, catheter over the 0.035 inch Roadrunner guidewire followed
by the advancement of the [REDACTED] radial sheath which was advanced to
the left common carotid bifurcation. CEEJAY 2 catheter and the wire
were removed. Good aspiration obtained from radial [REDACTED] sheath.

Control arteriogram was then performed over the left common carotid
bifurcation, and the left anterior circulation.
FINDINGS: The left common carotid bifurcation demonstrates the left external
carotid artery and its major branches to be widely patent.

The left internal carotid artery at the bulb to the cranial skull
base demonstrates wide patency.

The petrous, the cavernous and the supraclinoid segments are widely
patent with an approximately 50% stenosis of the distal cavernous
segment of the left internal carotid artery secondary to a smooth
atherosclerotic plaque along the anterior wall of the internal
carotid artery.

Again demonstrated is an approximately 4.5 mm x 3.5 mm posterior
communicating artery aneurysm projecting inferiorly and laterally.
This has a moderate sized neck with the PCOM origin separate from
the neck of the aneurysm.

The left middle cerebral artery and the left anterior cerebral
artery opacify into the capillary and venous phases.

Mild to moderate intracranial arteriosclerotic changes are noted in
the left middle cerebral artery inferior division, and left anterior
cerebral artery pericallosal branch in the A3 region.

Over a 0.035 inch Roadrunner guidewire, the 7 French sheath was
advanced to the cervical petrous junction.

The guidewire was removed. Good aspiration obtained from the hub of
[REDACTED] guide sheath. A control arteriogram performed through this
continued to demonstrate no change in the intracranial circulation.

A Headway 2 tip 017 microcatheter was then used with slight J
curvature.

It was then advanced over a 0.14 inch standard Synchro micro
guidewire with a J-tip configuration to the cavernous segment of the
left internal carotid artery.

Using biplane roadmap technique and constant fluoroscopic guidance,
and a torque device, access into the aneurysm was obtained with the
micro guidewire followed by the microcatheter. The guidewire was
removed. Good aspiration obtained from the intra aneurysmal
microcatheter. This was then connected to continuous heparinized
saline infusion.

Embolization was then ready to begin.

The first coil utilized was a 4 mm x 6 cm Target 360 Soft coil. This
was purged with heparinized saline infusion in its housing.

It was then advanced in a coaxial manner and with constant
heparinized infusion to the tip of the intra aneurysmal
microcatheter.

It was then advanced into aneurysm with a stable frame being formed.
A control arteriogram performed through the guide sheath
demonstrated safe positioning of the coil to be detached. This was
then detached. This was subsequently followed by the advancement of
a 2 mm x 3 cm Target Helical Ultra Soft coil, and a 2 mm x 2 cm
Micro Plex stent coil. Each of these coils was advanced into the
aneurysm using biplane roadmap technique and constant fluoroscopic
guidance. Prior to the detachment, a control arteriogram was
performed to ensure stability of the coil mass.

On the final coil, the microcatheter was then gently retrieved from
the coil mass without any change in the coil mass.

A control arteriogram performed through the guide sheath in the left
internal carotid artery continued to demonstrate near complete
obliteration of the aneurysm with patency of the left posterior
communicating artery.

A control arteriogram perfor[REDACTED]ed intracranially demonstrated
no change in the intracranial circulation. No evidence of
intraluminal filling defects or of occlusion was seen.

The guide sheath was retrieved and removed. Hemostasis at the right
radial puncture site was achieved with a wrist band. Distal right
radial pulse was verified to be present.

During this time the patient's ACT was maintained in the region of
200 seconds.

Patient was then extubated without difficulty. Upon recovery, the
patient reported no evidence of headaches, nausea or vomiting or
visual or motor or sensory symptoms.

She was then transferred to the PACU and then neuro ICU. Overnight
neurologically the patient remained stable. She was able to enjoy
clear liquids. Neurologically she remained stable. The wrist
puncture site was also soft. Distal radial pulse was intact.

She was then mobilized without difficulty. She was discharged home
under the care of her sister with special instructions to maintain
adequate hydration and to continue taking aspirin 325 mg a day and
to stop the Plavix. She was advised to resume her home meds as
prescribed by her primary care. She was advised to refrain from
stooping, bending or lifting weights above 10 pounds for 2 weeks.
Additionally she was advised to refrain from driving.

She expressed understanding and agreement with the above management
plan.
IMPRESSION: Status post endovascular primary coiling of left posterior
communicating artery aneurysm with near complete obliteration.

PLAN:
Follow-up in the clinic 2-4 weeks post discharge.

## 2020-01-19 SURGERY — IR WITH ANESTHESIA
Anesthesia: General

## 2020-01-19 MED ORDER — PHENYLEPHRINE 40 MCG/ML (10ML) SYRINGE FOR IV PUSH (FOR BLOOD PRESSURE SUPPORT)
PREFILLED_SYRINGE | INTRAVENOUS | Status: DC | PRN
  Administered 2020-01-19: 160 ug via INTRAVENOUS
  Administered 2020-01-19: 80 ug via INTRAVENOUS
  Administered 2020-01-19: 40 ug via INTRAVENOUS
  Administered 2020-01-19: 80 ug via INTRAVENOUS
  Administered 2020-01-19: 120 ug via INTRAVENOUS

## 2020-01-19 MED ORDER — SODIUM CHLORIDE 0.9 % IV SOLN: INTRAVENOUS | Status: DC

## 2020-01-19 MED ORDER — CLEVIDIPINE BUTYRATE 0.5 MG/ML IV EMUL
0.0000 mg/h | INTRAVENOUS | Status: DC
  Administered 2020-01-19: 2 mg/h via INTRAVENOUS
  Filled 2020-01-19: qty 50

## 2020-01-19 MED ORDER — PNEUMOCOCCAL VAC POLYVALENT 25 MCG/0.5ML IJ INJ: 0.5000 mL | INJECTION | INTRAMUSCULAR | Status: DC | PRN

## 2020-01-19 MED ORDER — NITROGLYCERIN 1 MG/10 ML FOR IR/CATH LAB
INTRA_ARTERIAL | Status: AC
  Filled 2020-01-19: qty 10

## 2020-01-19 MED ORDER — ASPIRIN EC 325 MG PO TBEC: 325.0000 mg | DELAYED_RELEASE_TABLET | ORAL | Status: AC

## 2020-01-19 MED ORDER — LIDOCAINE 2% (20 MG/ML) 5 ML SYRINGE
INTRAMUSCULAR | Status: DC | PRN
  Administered 2020-01-19: 60 mg via INTRAVENOUS

## 2020-01-19 MED ORDER — ONDANSETRON HCL 4 MG/2ML IJ SOLN
INTRAMUSCULAR | Status: DC | PRN
  Administered 2020-01-19: 4 mg via INTRAVENOUS

## 2020-01-19 MED ORDER — NITROGLYCERIN 1 MG/10 ML FOR IR/CATH LAB
INTRA_ARTERIAL | Status: DC | PRN
  Administered 2020-01-19 (×2): 200 ug via INTRA_ARTERIAL

## 2020-01-19 MED ORDER — ASPIRIN 325 MG PO TABS
325.0000 mg | ORAL_TABLET | Freq: Every day | ORAL | Status: DC
  Administered 2020-01-20: 325 mg via ORAL
  Filled 2020-01-19: qty 1

## 2020-01-19 MED ORDER — FENTANYL CITRATE (PF) 100 MCG/2ML IJ SOLN
25.0000 ug | INTRAMUSCULAR | Status: DC | PRN
Start: 2020-01-19 — End: 2020-01-19

## 2020-01-19 MED ORDER — CLOPIDOGREL BISULFATE 75 MG PO TABS
75.0000 mg | ORAL_TABLET | Freq: Every day | ORAL | Status: DC
  Administered 2020-01-20: 75 mg via ORAL
  Filled 2020-01-19: qty 1

## 2020-01-19 MED ORDER — VERAPAMIL HCL 2.5 MG/ML IV SOLN
INTRAVENOUS | Status: DC | PRN
  Administered 2020-01-19: 2.5 mg via INTRAVENOUS

## 2020-01-19 MED ORDER — CHLORHEXIDINE GLUCONATE 0.12 % MT SOLN: 15.0000 mL | Freq: Once | OROMUCOSAL | Status: AC

## 2020-01-19 MED ORDER — ONDANSETRON HCL 4 MG/2ML IJ SOLN: 4.0000 mg | Freq: Once | INTRAMUSCULAR | Status: DC | PRN

## 2020-01-19 MED ORDER — CLOPIDOGREL BISULFATE 75 MG PO TABS
ORAL_TABLET | ORAL | Status: AC
  Filled 2020-01-19: qty 1

## 2020-01-19 MED ORDER — LIDOCAINE HCL 1 % IJ SOLN
INTRAMUSCULAR | Status: AC
  Filled 2020-01-19: qty 20

## 2020-01-19 MED ORDER — DEXAMETHASONE SODIUM PHOSPHATE 10 MG/ML IJ SOLN
INTRAMUSCULAR | Status: DC | PRN
  Administered 2020-01-19: 5 mg via INTRAVENOUS

## 2020-01-19 MED ORDER — MIDAZOLAM HCL 2 MG/2ML IJ SOLN
INTRAMUSCULAR | Status: DC | PRN
  Administered 2020-01-19: 2 mg via INTRAVENOUS

## 2020-01-19 MED ORDER — FENTANYL CITRATE (PF) 100 MCG/2ML IJ SOLN: 25.0000 ug | INTRAMUSCULAR | Status: DC | PRN

## 2020-01-19 MED ORDER — CHLORHEXIDINE GLUCONATE 0.12 % MT SOLN
OROMUCOSAL | Status: AC
  Administered 2020-01-19: 15 mL via OROMUCOSAL
  Filled 2020-01-19: qty 15

## 2020-01-19 MED ORDER — CEFAZOLIN SODIUM-DEXTROSE 2-3 GM-%(50ML) IV SOLR
INTRAVENOUS | Status: DC | PRN
Start: 2020-01-19 — End: 2020-01-19
  Administered 2020-01-19: 2 g via INTRAVENOUS

## 2020-01-19 MED ORDER — ACETAMINOPHEN 160 MG/5ML PO SOLN: 650.0000 mg | ORAL | Status: DC | PRN

## 2020-01-19 MED ORDER — ROCURONIUM BROMIDE 10 MG/ML (PF) SYRINGE
PREFILLED_SYRINGE | INTRAVENOUS | Status: DC | PRN
  Administered 2020-01-19: 50 mg via INTRAVENOUS

## 2020-01-19 MED ORDER — OXYCODONE HCL 5 MG/5ML PO SOLN: 5.0000 mg | Freq: Once | ORAL | Status: DC | PRN

## 2020-01-19 MED ORDER — FENTANYL CITRATE (PF) 250 MCG/5ML IJ SOLN
INTRAMUSCULAR | Status: DC | PRN
  Administered 2020-01-19 (×2): 25 ug via INTRAVENOUS
  Administered 2020-01-19: 50 ug via INTRAVENOUS

## 2020-01-19 MED ORDER — CLOPIDOGREL BISULFATE 75 MG PO TABS
75.0000 mg | ORAL_TABLET | ORAL | Status: AC
  Administered 2020-01-19: 75 mg via ORAL
  Filled 2020-01-19: qty 1

## 2020-01-19 MED ORDER — ACETAMINOPHEN 500 MG PO TABS: 1000.0000 mg | ORAL_TABLET | Freq: Once | ORAL | Status: DC | PRN

## 2020-01-19 MED ORDER — ONDANSETRON HCL 4 MG/2ML IJ SOLN: 4.0000 mg | Freq: Four times a day (QID) | INTRAMUSCULAR | Status: DC | PRN

## 2020-01-19 MED ORDER — CHLORHEXIDINE GLUCONATE CLOTH 2 % EX PADS
6.0000 | MEDICATED_PAD | Freq: Every day | CUTANEOUS | Status: DC
  Administered 2020-01-20: 6 via TOPICAL

## 2020-01-19 MED ORDER — HEPARIN SODIUM (PORCINE) 1000 UNIT/ML IJ SOLN
INTRAMUSCULAR | Status: DC | PRN
  Administered 2020-01-19: 2000 [IU] via INTRA_ARTERIAL

## 2020-01-19 MED ORDER — NIMODIPINE 30 MG PO CAPS: 0.0000 mg | ORAL_CAPSULE | ORAL | Status: DC

## 2020-01-19 MED ORDER — ACETAMINOPHEN 10 MG/ML IV SOLN: 1000.0000 mg | Freq: Once | INTRAVENOUS | Status: DC | PRN

## 2020-01-19 MED ORDER — HEPARIN SODIUM (PORCINE) 1000 UNIT/ML IJ SOLN
INTRAMUSCULAR | Status: DC | PRN
Start: 2020-01-19 — End: 2020-01-19
  Administered 2020-01-19: 1000 [IU] via INTRAVENOUS

## 2020-01-19 MED ORDER — PROPOFOL 10 MG/ML IV BOLUS
INTRAVENOUS | Status: DC | PRN
  Administered 2020-01-19: 90 mg via INTRAVENOUS

## 2020-01-19 MED ORDER — IOHEXOL 300 MG/ML  SOLN
150.0000 mL | Freq: Once | INTRAMUSCULAR | Status: AC | PRN
  Administered 2020-01-19: 80 mL via INTRA_ARTERIAL

## 2020-01-19 MED ORDER — LACTATED RINGERS IV SOLN: INTRAVENOUS | Status: DC

## 2020-01-19 MED ORDER — OXYCODONE HCL 5 MG PO TABS: 5.0000 mg | ORAL_TABLET | Freq: Once | ORAL | Status: DC | PRN

## 2020-01-19 MED ORDER — LACTATED RINGERS IV SOLN: INTRAVENOUS | Status: DC | PRN

## 2020-01-19 MED ORDER — CEFAZOLIN SODIUM-DEXTROSE 2-4 GM/100ML-% IV SOLN: 2.0000 g | INTRAVENOUS | Status: DC

## 2020-01-19 MED ORDER — CLOPIDOGREL BISULFATE 75 MG PO TABS: 75.0000 mg | ORAL_TABLET | Freq: Every day | ORAL | Status: DC

## 2020-01-19 MED ORDER — CEFAZOLIN SODIUM-DEXTROSE 2-4 GM/100ML-% IV SOLN
INTRAVENOUS | Status: AC
  Filled 2020-01-19: qty 100

## 2020-01-19 MED ORDER — VERAPAMIL HCL 2.5 MG/ML IV SOLN
INTRAVENOUS | Status: AC
  Filled 2020-01-19: qty 2

## 2020-01-19 MED ORDER — ACETAMINOPHEN 650 MG RE SUPP: 650.0000 mg | RECTAL | Status: DC | PRN

## 2020-01-19 MED ORDER — ACETAMINOPHEN 160 MG/5ML PO SOLN: 1000.0000 mg | Freq: Once | ORAL | Status: DC | PRN

## 2020-01-19 MED ORDER — ACETAMINOPHEN 325 MG PO TABS
650.0000 mg | ORAL_TABLET | ORAL | Status: DC | PRN
  Administered 2020-01-20: 650 mg via ORAL
  Filled 2020-01-19: qty 2

## 2020-01-19 MED ORDER — LIDOCAINE HCL (PF) 1 % IJ SOLN
INTRAMUSCULAR | Status: DC | PRN
  Administered 2020-01-19: 5 mL

## 2020-01-19 MED ORDER — ASPIRIN 81 MG PO CHEW: 324.0000 mg | CHEWABLE_TABLET | Freq: Every day | ORAL | Status: DC

## 2020-01-19 MED ORDER — PHENYLEPHRINE HCL-NACL 10-0.9 MG/250ML-% IV SOLN
INTRAVENOUS | Status: DC | PRN
  Administered 2020-01-19: 25 ug/min via INTRAVENOUS

## 2020-01-19 MED ORDER — ORAL CARE MOUTH RINSE: 15.0000 mL | Freq: Once | OROMUCOSAL | Status: AC

## 2020-01-19 MED ORDER — SUGAMMADEX SODIUM 200 MG/2ML IV SOLN
INTRAVENOUS | Status: DC | PRN
  Administered 2020-01-19: 200 mg via INTRAVENOUS

## 2020-01-19 MED ORDER — HEPARIN SODIUM (PORCINE) 1000 UNIT/ML IJ SOLN
INTRAMUSCULAR | Status: AC
  Filled 2020-01-19: qty 1

## 2020-01-19 MED ORDER — ASPIRIN EC 325 MG PO TBEC
DELAYED_RELEASE_TABLET | ORAL | Status: AC
  Administered 2020-01-19: 325 mg via ORAL
  Filled 2020-01-19: qty 1

## 2020-01-19 NOTE — Anesthesia Procedure Notes (Signed)
Arterial Line Insertion Start/End7/28/2021 12:40 PM Performed by: Dairl Ponder, CRNA  Patient location: Pre-op. Preanesthetic checklist: patient identified, IV checked, site marked, risks and benefits discussed, surgical consent, monitors and equipment checked, pre-op evaluation, timeout performed and anesthesia consent Lidocaine 1% used for infiltration Left, radial was placed Catheter size: 20 Fr Hand hygiene performed  and maximum sterile barriers used   Attempts: 1 Procedure performed without using ultrasound guided technique. Following insertion, dressing applied. Post procedure assessment: normal and unchanged

## 2020-01-19 NOTE — Progress Notes (Signed)
Tr band assessed with PACU RN at bedside

## 2020-01-19 NOTE — Anesthesia Procedure Notes (Signed)
Procedure Name: Intubation Date/Time: 01/19/2020 2:31 PM Performed by: Modena Morrow, CRNA Pre-anesthesia Checklist: Patient identified, Emergency Drugs available, Suction available and Patient being monitored Patient Re-evaluated:Patient Re-evaluated prior to induction Oxygen Delivery Method: Circle system utilized Preoxygenation: Pre-oxygenation with 100% oxygen Induction Type: IV induction Ventilation: Mask ventilation without difficulty Laryngoscope Size: Miller and 2 Grade View: Grade I Tube type: Oral Tube size: 7.0 mm Number of attempts: 1 Airway Equipment and Method: Stylet and Oral airway Placement Confirmation: ETT inserted through vocal cords under direct vision,  positive ETCO2 and breath sounds checked- equal and bilateral Secured at: 21 cm Tube secured with: Tape Dental Injury: Teeth and Oropharynx as per pre-operative assessment

## 2020-01-19 NOTE — Transfer of Care (Signed)
Immediate Anesthesia Transfer of Care Note  Patient: Kendra Richardson  Procedure(s) Performed: IR WITH ANESTHESIA  EMBOLIZATION (N/A )  Patient Location: PACU  Anesthesia Type:General  Level of Consciousness: awake, alert , oriented and patient cooperative  Airway & Oxygen Therapy: Patient Spontanous Breathing and Patient connected to face mask oxygen  Post-op Assessment: Report given to RN and Post -op Vital signs reviewed and stable  Post vital signs: Reviewed and stable  Last Vitals:  Vitals Value Taken Time  BP 127/83 01/19/20 1655  Temp    Pulse 56 01/19/20 1700  Resp 18 01/19/20 1700  SpO2 100 % 01/19/20 1700  Vitals shown include unvalidated device data.  Last Pain:  Vitals:   01/19/20 0740  TempSrc:   PainSc: 0-No pain         Complications: No complications documented.

## 2020-01-19 NOTE — Anesthesia Preprocedure Evaluation (Addendum)
Anesthesia Evaluation  Patient identified by MRN, date of birth, ID band Patient awake    Reviewed: Allergy & Precautions, NPO status , Patient's Chart, lab work & pertinent test results, reviewed documented beta blocker date and time   History of Anesthesia Complications (+) PONV and history of anesthetic complications  Airway Mallampati: II  TM Distance: >3 FB Neck ROM: Full    Dental  (+) Dental Advisory Given, Lower Dentures   Pulmonary former smoker,    Pulmonary exam normal breath sounds clear to auscultation       Cardiovascular hypertension, Pt. on home beta blockers and Pt. on medications +CHF  Normal cardiovascular exam Rhythm:Regular Rate:Normal     Neuro/Psych BRAIN ANEURYSM CVA, No Residual Symptoms negative psych ROS   GI/Hepatic negative GI ROS, Neg liver ROS,   Endo/Other  negative endocrine ROS  Renal/GU Renal InsufficiencyRenal disease     Musculoskeletal negative musculoskeletal ROS (+)   Abdominal   Peds  Hematology  (+) Blood dyscrasia (Plavix), ,   Anesthesia Other Findings Day of surgery medications reviewed with the patient.  Reproductive/Obstetrics                            Anesthesia Physical Anesthesia Plan  ASA: IV  Anesthesia Plan: General   Post-op Pain Management:    Induction: Intravenous  PONV Risk Score and Plan: 4 or greater and Midazolam, Ondansetron and Dexamethasone  Airway Management Planned: Oral ETT  Additional Equipment: Arterial line  Intra-op Plan:   Post-operative Plan: Possible Post-op intubation/ventilation  Informed Consent: I have reviewed the patients History and Physical, chart, labs and discussed the procedure including the risks, benefits and alternatives for the proposed anesthesia with the patient or authorized representative who has indicated his/her understanding and acceptance.     Dental advisory given  Plan  Discussed with: CRNA  Anesthesia Plan Comments:         Anesthesia Quick Evaluation

## 2020-01-19 NOTE — Progress Notes (Signed)
Case delayed due to an emergent CODE STROKE.   PA to bedside to apologetically notify patient of the current status of her case and the reason for the delay.  She is willing to continue to wait and work towards procedure today.   Ok to wait for placement 2nd IV and arterial line until we know whether case will move forward as long as this is agreeable with anesthesia.   Loyce Dys, MS RD PA-C

## 2020-01-19 NOTE — Anesthesia Postprocedure Evaluation (Signed)
Anesthesia Post Note  Patient: Kendra Richardson  Procedure(s) Performed: IR WITH ANESTHESIA  EMBOLIZATION (N/A )     Patient location during evaluation: PACU Anesthesia Type: General Level of consciousness: awake and alert Pain management: pain level controlled Vital Signs Assessment: post-procedure vital signs reviewed and stable Respiratory status: spontaneous breathing, nonlabored ventilation, respiratory function stable and patient connected to nasal cannula oxygen Cardiovascular status: blood pressure returned to baseline and stable Postop Assessment: no apparent nausea or vomiting Anesthetic complications: no   No complications documented.  Last Vitals:  Vitals:   01/19/20 1845 01/19/20 1900  BP:  (!) 106/87  Pulse: 65 72  Resp: 14 19  Temp:    SpO2: 97% 96%    Last Pain:  Vitals:   01/19/20 1800  TempSrc:   PainSc: 0-No pain                 Cecile Hearing

## 2020-01-19 NOTE — Progress Notes (Signed)
Patient ID: Kendra Richardson, female   DOB: [birth year], 64 y.o.   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 Patient seen and examined.  Clinically patient denies  any new symptoms of nausea vomiting headache visual symptoms, motor weakness, sensory symptoms are coordination difficulties.  She denies any episodes of loss of consciousness or seizures.  Denies any recent symptoms of fever chills chest pain shortness of breath or difficulty with urination.  Denies any recent constipation diarrhea or of generalized malaise..  Patient reports taking her aspirin and Plavix on a regular basis.,  Examination.  Non focal neurological exam l.  Patient alert awake oriented to time place space.  The endovascular procedure was again explained to the patient.  This has been discussed previously during  previous consultations also. Risk of 1 to 2% chance of ischemic stroke, remote possibility of intracranial hemorrhage, and possibly delayed hemorrhage reviewed.  Patient has expressed understanding and wishes to proceed.  Informed witnessed consent obtained. S.Faylinn Schwenn MD

## 2020-01-19 NOTE — Procedures (Signed)
S/P LT common carotid arteriogram RT radial approach.. 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 Patient extubated . Breathing without difficulty. Denies any H/As,N/V ,visual problems or speech difficulties. Oriented to year and place.  Speech clear. Comprehension normal  Pupils 3 mm RT = LT NO facial asymmetry. [place] midline. Moves all 4s equally. RT rad pulse doppleable. S.Ellar Hakala MD

## 2020-01-19 NOTE — H&P (Addendum)
Chief Complaint: Patient was seen in consultation today for eft posterior communicating artery aneurysm embolization at the request of Dr Jerel Shepherd   Supervising Physician: Julieanne Cotton  Patient Status: [facility] - Out-pt  History of Present Illness: Kendra Richardson is a 64 y.o. 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 Scattered mild to moderate intracranial arteriosclerotic changes involving the pericallosal anterior cerebral artery branches, and the superior division of the left middle cerebral artery, and the right posterior-inferior cerebellar artery. 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  Consult with Dr Corliss Skains 7/6: Plan for follow-up: The patient and her sister are in agreement to proceed with an endovascular embolization procedure (coils and/or stent placement or WEB placement). The patient and her sister understand that this procedure will require general anesthesia and a minimum of one-night stay in the hospital for observation.   Procedure scheduled for today Taking Plavix and asa daily [id number] yesterday Performed at [facility] coagulation lab 01/18/20  Past Medical History:  Diagnosis Date  . Abnormal radiographic examination   . CHF (congestive heart failure) (HCC)   . Chronic kidney disease, stage III (moderate)   . Chronic kidney disease, stage III (moderate)   . CN (constipation)   . Essential hypertension, malignant   . HTN (hypertension)   . LVH (left ventricular hypertrophy)   . Protein-calorie malnutrition (HCC)     Past Surgical History:  Procedure Laterality Date  . BREAST EXCISIONAL BIOPSY Left   . IR 3D  INDEPENDENT WKST  12/23/2019  . IR ANGIO INTRA EXTRACRAN SEL COM CAROTID INNOMINATE BILAT MOD SED  12/23/2019  . IR ANGIO VERTEBRAL SEL SUBCLAVIAN INNOMINATE UNI L MOD SED  12/23/2019  . IR ANGIO VERTEBRAL SEL VERTEBRAL UNI R MOD SED  12/23/2019  . IR US GUIDE VASC ACCESS RIGHT  12/23/2019    Allergies: Patient has no known allergies.  Medications: Prior to Admission medications   Medication Sig Start Date End Date Taking? Authorizing Provider  amLODipine (NORVASC) 10 MG tablet Take 10 mg by mouth daily.   Yes [provider]  aspirin EC 81 MG tablet Take 1 tablet (81 mg total) by mouth daily. 10/28/19 01/26/20 Yes Dorcas Carrow, MD  carvedilol (COREG) 25 MG tablet Take 1 tablet (25 mg total) by mouth 2 (two) times daily. 09/17/19  Yes Jake Bathe, MD  clopidogrel (PLAVIX) 75 MG tablet Take 1 tablet (75 mg total) by mouth daily. 10/29/19 01/27/20 Yes Dorcas Carrow, MD  Multiple Vitamin (MULTIVITAMIN) tablet Take 1 tablet by mouth daily.   Yes [provider]  rosuvastatin (CRESTOR) 5 MG tablet Take 5 mg by mouth every Monday, Wednesday, and Friday.    Yes [provider]  sacubitril-valsartan (ENTRESTO) 97-103 MG Take 1 tablet by mouth 2 (two) times daily. 08/19/19  Yes Jake Bathe, MD     Family History  Problem Relation Age of Onset  . Breast cancer Mother   . Breast cancer Maternal Aunt   . Stroke Father   . Kidney disease Father     Social History   Socioeconomic History  . Marital status: Single    Spouse name: Not on file  . Number of children: Not  on file  . Years of education: Not on file  . Highest education level: Not on file  Occupational History  . Not on file  Tobacco Use  . Smoking status: Former Smoker  Substance and Sexual Activity  . Alcohol use: Not on file  . Drug use: Not on file  . Sexual activity: Not on file  Other Topics Concern  . Not on file  Social History Narrative  . Not on file   Social Determinants of Health    Financial Resource Strain:   . Difficulty of Paying Living Expenses:   Food Insecurity:   . Worried About Programme researcher, broadcasting/film/video in the Last Year:   . Barista in the Last Year:   Transportation Needs: No Transportation Needs  . Lack of Transportation (Medical): No  . Lack of Transportation (Non-Medical): No  Physical Activity:   . Days of Exercise per Week:   . Minutes of Exercise per Session:   Stress:   . Feeling of Stress :   Social Connections:   . Frequency of Communication with Friends and Family:   . Frequency of Social Gatherings with Friends and Family:   . Attends Religious Services:   . Active Member of Clubs or Organizations:   . Attends Banker Meetings:   Marland Kitchen Marital Status:      Review of Systems: A 12 point ROS discussed and pertinent positives are indicated in the HPI above.  All other systems are negative.  Review of Systems  Constitutional: Negative for activity change, fatigue and fever.  Respiratory: Negative for cough and shortness of breath.   Cardiovascular: Negative for chest pain.  Gastrointestinal: Negative for abdominal pain.  Musculoskeletal: Negative for back pain.  Neurological: Negative for dizziness, tremors, seizures, syncope, facial asymmetry, speech difficulty, weakness, light-headedness, numbness and headaches.  Psychiatric/Behavioral: Negative for behavioral problems and confusion.    Vital Signs: BP (!) 141/91   Pulse 70   Temp 98.5 F (36.9 C) (Oral)   Resp 17   Ht 5' 4.5" (1.638 m)   Wt 116 lb (52.6 kg)   SpO2 98%   BMI 19.60 kg/m   Physical Exam Vitals reviewed.  Constitutional:      Comments: Face symmetrical Tongue midline  HENT:     Head: Atraumatic.     Mouth/Throat:     Mouth: Mucous membranes are moist.  Eyes:     Extraocular Movements: Extraocular movements intact.  Cardiovascular:     Rate and Rhythm: Normal rate and regular rhythm.     Heart sounds: Normal heart sounds.  Pulmonary:      Effort: Pulmonary effort is normal.     Breath sounds: Normal breath sounds. No wheezing.  Abdominal:     General: There is no distension.     Palpations: Abdomen is soft.     Tenderness: There is no abdominal tenderness.  Musculoskeletal:        General: No swelling, tenderness, deformity or signs of injury. Normal range of motion.     Cervical back: Normal range of motion.     Right lower leg: No edema.     Left lower leg: No edema.  Skin:    General: Skin is warm.  Neurological:     Mental Status: She is alert and oriented to person, place, and time.  Psychiatric:        Mood and Affect: Mood normal.        Behavior: Behavior normal.  Thought Content: Thought content normal.        Judgment: Judgment normal.     Imaging: IR 3D Independent Wkst  Result Date: 12/28/2019 CLINICAL DATA:  Patient with history of stroke. Intermittent headaches. 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 EXAM: WORKSTATION 3D RECONSTRUCTION; IR ANGIO VERTEBRAL SEL SUBCLAVIAN INNOMINATE UNI LEFT MOD SED; IR ANGIO VERTEBRAL SEL VERTEBRAL UNI RIGHT MOD SED; BILATERAL COMMON CAROTID AND INNOMINATE ANGIOGRAPHY; IR ULTRASOUND GUIDANCE VASC ACCESS RIGHT COMPARISON:  CT angiogram of the head and neck of Oct 27, 2019 and MRI of the brain of Oct 26, 2019. MEDICATIONS: Heparin 2000 units IA; no antibiotic was administered within 1 hour of the procedure. ANESTHESIA/SEDATION: Versed 1 mg IV; Fentanyl 25 mcg IV Moderate Sedation Time:  57 minutes The patient was continuously monitored during the procedure by the interventional radiology nurse under my direct supervision. CONTRAST:  Isovue 300 approximately 65 mL FLUOROSCOPY TIME:  Fluoroscopy Time: 22 minutes 6 seconds (698 mGy). COMPLICATIONS: None immediate. TECHNIQUE: Informed written consent was obtained from the patient after a thorough discussion of the procedural risks, benefits and alternatives. All questions were  addressed. Maximal Sterile Barrier Technique was utilized including caps, mask, sterile gowns, sterile gloves, sterile drape, hand hygiene and skin antiseptic. A timeout was performed prior to the initiation of the procedure. The right forearm to the wrist was prepped and draped in the usual sterile manner. The right radial artery morphology was then verified with ultrasound and documented. A dorsal palmar anastomosis was verified to be present. Using ultrasound guidance, transradial access was obtained with a micropuncture set over a 0.018 inch micro guidewire. A 4/5 French radial sheath was then inserted without difficulty. The obturator, and micro guidewire were removed. Good aspiration was obtained from the radial sheath. A cocktail of 2000 units of heparin, 2.5 mg of verapamil, and 200 mcg of nitroglycerin was then infused in diluted form without event. A right radial arteriogram was also obtained. Over a 0.035 Roadrunner guidewire, a 5 [place] Simmons 2 diagnostic catheter was advanced to the aortic arch region, and cannulations were performed of the right vertebral artery, the right common carotid artery, the left common carotid artery and the left subclavian arteries. Following the procedure, right radial puncture hemostasis was achieved with a wrist band. Distal right radial pulse was verified to be present. FINDINGS: The right common carotid arteriogram demonstrates the right external carotid artery and its major branches to be widely patent. The right internal carotid artery at the bulb to the cranial skull base also demonstrates wide patency. The petrous segment and the proximal cavernous segment are widely patent. There is diffuse segmental narrowing of the supraclinoid right ICA at the level of the ophthalmic artery with approximately 50% stenosis. The right middle cerebral artery and the anterior cerebral artery opacify into the capillary and venous phases. Mild caliber irregularity is seen of the  right pericallosal artery probably also associated with intracranial arteriosclerosis. The right vertebral artery origin is widely patent. The vessel opacifies to the cranial skull base. Wide patency is seen of the right vertebrobasilar junction and the right posterior-inferior cerebellar artery. The PICA demonstrates focal areas of caliber irregularity and mild narrowing in its proximal 1/3 most consistent with intracranial arteriosclerosis. The basilar artery, the right posterior cerebral artery, the superior cerebellar arteries and the anterior-inferior cerebellar arteries opacify into the capillary and venous phases. Mild caliber irregularity and narrowing seen of both superior cerebellar arteries. Inflow of unopacified blood is seen  in the basilar artery from the contralateral vertebral artery. The origin of the left vertebral artery is widely patent. The vessel opacifies to the cranial skull base. Wide patency is seen of the left vertebrobasilar junction and the left posterior-inferior cerebellar artery. The basilar artery, the right posterior cerebral artery, the superior cerebellar arteries and the anterior-inferior cerebellar arteries opacify into the capillary and venous phases. There appears to be delayed filling of the left posterior cerebral artery. The petrous and the proximal cavernous segments are widely patent. The left common carotid arteriogram demonstrates the left external carotid artery and its major branches to be widely patent. The left internal carotid artery at the bulb to the cranial skull base also demonstrates wide patency. Mild to moderate narrowing is seen of the of the supraclinoid left ICA proximal to the left posterior communicating artery region aneurysm. Approximately 5.5 mm x 3.5 mm moderate sized neck mild lobulated saccular aneurysm arising in the region of the left posterior communicating artery. Mild-to-moderate intracranial arteriosclerotic changes involving the anterior  cerebral arteries, the pericallosal artery branches, and the superior division of the right middle cerebral artery, and the left saccular lobulated aneurysm is seen arising in the left posterior communicating artery region. A diminutive vessel is seen to arise in the region of the neck of the aneurysm. Just distal to this and also arising from the posterior wall of the left internal carotid artery is a prominent vessel that projects posteriorly supplying the distal left PCA distribution probably representing a dominant anterior choroidal artery. The middle aspect of this vessel appears to have focal areas of caliber irregularity also probably representing intracranial arteriosclerosis. The left middle cerebral artery and the left anterior cerebral artery opacify into the capillary and venous phases. Again seen are mild to moderate areas of caliber irregularity involving the left pericallosal artery, and the M2 peri-insular branches of the superior division probably representing intracranial arteriosclerosis. A 3D rotational arteriogram was performed with reconstruction of the images on a separate workstation confirming the aneurysm morphology. 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 Scattered mild to moderate intracranial arteriosclerotic changes involving the pericallosal anterior cerebral artery branches, and the superior division of the left middle cerebral artery, and the right posterior-inferior cerebellar artery. 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 PLAN: Follow-up consult involving the management of the left posterior communicating artery region aneurysm. Electronically Signed   By: Julieanne Cotton M.D.   On: 12/23/2019 11:13   IR US Guide Vasc Access Right  Result Date: 12/28/2019 CLINICAL DATA:  Patient with history of  stroke. Intermittent headaches. 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 EXAM: WORKSTATION 3D RECONSTRUCTION; IR ANGIO VERTEBRAL SEL SUBCLAVIAN INNOMINATE UNI LEFT MOD SED; IR ANGIO VERTEBRAL SEL VERTEBRAL UNI RIGHT MOD SED; BILATERAL COMMON CAROTID AND INNOMINATE ANGIOGRAPHY; IR ULTRASOUND GUIDANCE VASC ACCESS RIGHT COMPARISON:  CT angiogram of the head and neck of Oct 27, 2019 and MRI of the brain of Oct 26, 2019. MEDICATIONS: Heparin 2000 units IA; no antibiotic was administered within 1 hour of the procedure. ANESTHESIA/SEDATION: Versed 1 mg IV; Fentanyl 25 mcg IV Moderate Sedation Time:  57 minutes The patient was continuously monitored during the procedure by the interventional radiology nurse under my direct supervision. CONTRAST:  Isovue 300 approximately 65 mL FLUOROSCOPY TIME:  Fluoroscopy Time: 22 minutes 6 seconds (698 mGy). COMPLICATIONS:  None immediate. TECHNIQUE: Informed written consent was obtained from the patient after a thorough discussion of the procedural risks, benefits and alternatives. All questions were addressed. Maximal Sterile Barrier Technique was utilized including caps, mask, sterile gowns, sterile gloves, sterile drape, hand hygiene and skin antiseptic. A timeout was performed prior to the initiation of the procedure. The right forearm to the wrist was prepped and draped in the usual sterile manner. The right radial artery morphology was then verified with ultrasound and documented. A dorsal palmar anastomosis was verified to be present. Using ultrasound guidance, transradial access was obtained with a micropuncture set over a 0.018 inch micro guidewire. A 4/5 French radial sheath was then inserted without difficulty. The obturator, and micro guidewire were removed. Good aspiration was obtained from the radial sheath. A cocktail of 2000 units of heparin, 2.5 mg of verapamil, and 200 mcg of nitroglycerin was then infused in diluted form  without event. A right radial arteriogram was also obtained. Over a 0.035 Roadrunner guidewire, a 5 [place] Simmons 2 diagnostic catheter was advanced to the aortic arch region, and cannulations were performed of the right vertebral artery, the right common carotid artery, the left common carotid artery and the left subclavian arteries. Following the procedure, right radial puncture hemostasis was achieved with a wrist band. Distal right radial pulse was verified to be present. FINDINGS: The right common carotid arteriogram demonstrates the right external carotid artery and its major branches to be widely patent. The right internal carotid artery at the bulb to the cranial skull base also demonstrates wide patency. The petrous segment and the proximal cavernous segment are widely patent. There is diffuse segmental narrowing of the supraclinoid right ICA at the level of the ophthalmic artery with approximately 50% stenosis. The right middle cerebral artery and the anterior cerebral artery opacify into the capillary and venous phases. Mild caliber irregularity is seen of the right pericallosal artery probably also associated with intracranial arteriosclerosis. The right vertebral artery origin is widely patent. The vessel opacifies to the cranial skull base. Wide patency is seen of the right vertebrobasilar junction and the right posterior-inferior cerebellar artery. The PICA demonstrates focal areas of caliber irregularity and mild narrowing in its proximal 1/3 most consistent with intracranial arteriosclerosis. The basilar artery, the right posterior cerebral artery, the superior cerebellar arteries and the anterior-inferior cerebellar arteries opacify into the capillary and venous phases. Mild caliber irregularity and narrowing seen of both superior cerebellar arteries. Inflow of unopacified blood is seen in the basilar artery from the contralateral vertebral artery. The origin of the left vertebral artery is widely  patent. The vessel opacifies to the cranial skull base. Wide patency is seen of the left vertebrobasilar junction and the left posterior-inferior cerebellar artery. The basilar artery, the right posterior cerebral artery, the superior cerebellar arteries and the anterior-inferior cerebellar arteries opacify into the capillary and venous phases. There appears to be delayed filling of the left posterior cerebral artery. The petrous and the proximal cavernous segments are widely patent. The left common carotid arteriogram demonstrates the left external carotid artery and its major branches to be widely patent. The left internal carotid artery at the bulb to the cranial skull base also demonstrates wide patency. Mild to moderate narrowing is seen of the of the supraclinoid left ICA proximal to the left posterior communicating artery region aneurysm. Approximately 5.5 mm x 3.5 mm moderate sized neck mild lobulated saccular aneurysm arising in the region of the left posterior communicating artery. Mild-to-moderate intracranial arteriosclerotic changes  involving the anterior cerebral arteries, the pericallosal artery branches, and the superior division of the right middle cerebral artery, and the left saccular lobulated aneurysm is seen arising in the left posterior communicating artery region. A diminutive vessel is seen to arise in the region of the neck of the aneurysm. Just distal to this and also arising from the posterior wall of the left internal carotid artery is a prominent vessel that projects posteriorly supplying the distal left PCA distribution probably representing a dominant anterior choroidal artery. The middle aspect of this vessel appears to have focal areas of caliber irregularity also probably representing intracranial arteriosclerosis. The left middle cerebral artery and the left anterior cerebral artery opacify into the capillary and venous phases. Again seen are mild to moderate areas of caliber  irregularity involving the left pericallosal artery, and the M2 peri-insular branches of the superior division probably representing intracranial arteriosclerosis. A 3D rotational arteriogram was performed with reconstruction of the images on a separate workstation confirming the aneurysm morphology. 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 Scattered mild to moderate intracranial arteriosclerotic changes involving the pericallosal anterior cerebral artery branches, and the superior division of the left middle cerebral artery, and the right posterior-inferior cerebellar artery. 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 PLAN: Follow-up consult involving the management of the left posterior communicating artery region aneurysm. Electronically Signed   By: Julieanne Cotton M.D.   On: 12/23/2019 11:13   IR ANGIO INTRA EXTRACRAN SEL COM CAROTID INNOMINATE BILAT MOD SED  Result Date: 12/28/2019 CLINICAL DATA:  Patient with history of stroke. Intermittent headaches. 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 EXAM: WORKSTATION 3D RECONSTRUCTION; IR ANGIO VERTEBRAL SEL SUBCLAVIAN INNOMINATE UNI LEFT MOD SED; IR ANGIO VERTEBRAL SEL VERTEBRAL UNI RIGHT MOD SED; BILATERAL COMMON CAROTID AND INNOMINATE ANGIOGRAPHY; IR ULTRASOUND GUIDANCE VASC ACCESS RIGHT COMPARISON:  CT angiogram of the head and neck of Oct 27, 2019 and MRI of the brain of Oct 26, 2019. MEDICATIONS: Heparin 2000 units IA; no antibiotic was administered within 1 hour of the procedure. ANESTHESIA/SEDATION: Versed 1 mg IV; Fentanyl 25 mcg IV Moderate Sedation Time:  57 minutes The patient was continuously monitored during the procedure by the interventional radiology nurse under my direct supervision. CONTRAST:  Isovue 300 approximately  65 mL FLUOROSCOPY TIME:  Fluoroscopy Time: 22 minutes 6 seconds (698 mGy). COMPLICATIONS: None immediate. TECHNIQUE: Informed written consent was obtained from the patient after a thorough discussion of the procedural risks, benefits and alternatives. All questions were addressed. Maximal Sterile Barrier Technique was utilized including caps, mask, sterile gowns, sterile gloves, sterile drape, hand hygiene and skin antiseptic. A timeout was performed prior to the initiation of the procedure. The right forearm to the wrist was prepped and draped in the usual sterile manner. The right radial artery morphology was then verified with ultrasound and documented. A dorsal palmar anastomosis was verified to be present. Using ultrasound guidance, transradial access was obtained with a micropuncture set over a 0.018 inch micro guidewire. A 4/5 French radial sheath was then inserted without difficulty. The obturator, and micro guidewire were removed. Good aspiration was obtained from the radial sheath. A cocktail of 2000 units of heparin, 2.5 mg of verapamil, and 200 mcg of nitroglycerin was then infused in diluted form without event. A right radial arteriogram was also obtained. Over a  0.035 Roadrunner guidewire, a 5 [place] Simmons 2 diagnostic catheter was advanced to the aortic arch region, and cannulations were performed of the right vertebral artery, the right common carotid artery, the left common carotid artery and the left subclavian arteries. Following the procedure, right radial puncture hemostasis was achieved with a wrist band. Distal right radial pulse was verified to be present. FINDINGS: The right common carotid arteriogram demonstrates the right external carotid artery and its major branches to be widely patent. The right internal carotid artery at the bulb to the cranial skull base also demonstrates wide patency. The petrous segment and the proximal cavernous segment are widely patent. There is diffuse  segmental narrowing of the supraclinoid right ICA at the level of the ophthalmic artery with approximately 50% stenosis. The right middle cerebral artery and the anterior cerebral artery opacify into the capillary and venous phases. Mild caliber irregularity is seen of the right pericallosal artery probably also associated with intracranial arteriosclerosis. The right vertebral artery origin is widely patent. The vessel opacifies to the cranial skull base. Wide patency is seen of the right vertebrobasilar junction and the right posterior-inferior cerebellar artery. The PICA demonstrates focal areas of caliber irregularity and mild narrowing in its proximal 1/3 most consistent with intracranial arteriosclerosis. The basilar artery, the right posterior cerebral artery, the superior cerebellar arteries and the anterior-inferior cerebellar arteries opacify into the capillary and venous phases. Mild caliber irregularity and narrowing seen of both superior cerebellar arteries. Inflow of unopacified blood is seen in the basilar artery from the contralateral vertebral artery. The origin of the left vertebral artery is widely patent. The vessel opacifies to the cranial skull base. Wide patency is seen of the left vertebrobasilar junction and the left posterior-inferior cerebellar artery. The basilar artery, the right posterior cerebral artery, the superior cerebellar arteries and the anterior-inferior cerebellar arteries opacify into the capillary and venous phases. There appears to be delayed filling of the left posterior cerebral artery. The petrous and the proximal cavernous segments are widely patent. The left common carotid arteriogram demonstrates the left external carotid artery and its major branches to be widely patent. The left internal carotid artery at the bulb to the cranial skull base also demonstrates wide patency. Mild to moderate narrowing is seen of the of the supraclinoid left ICA proximal to the left  posterior communicating artery region aneurysm. Approximately 5.5 mm x 3.5 mm moderate sized neck mild lobulated saccular aneurysm arising in the region of the left posterior communicating artery. Mild-to-moderate intracranial arteriosclerotic changes involving the anterior cerebral arteries, the pericallosal artery branches, and the superior division of the right middle cerebral artery, and the left saccular lobulated aneurysm is seen arising in the left posterior communicating artery region. A diminutive vessel is seen to arise in the region of the neck of the aneurysm. Just distal to this and also arising from the posterior wall of the left internal carotid artery is a prominent vessel that projects posteriorly supplying the distal left PCA distribution probably representing a dominant anterior choroidal artery. The middle aspect of this vessel appears to have focal areas of caliber irregularity also probably representing intracranial arteriosclerosis. The left middle cerebral artery and the left anterior cerebral artery opacify into the capillary and venous phases. Again seen are mild to moderate areas of caliber irregularity involving the left pericallosal artery, and the M2 peri-insular branches of the superior division probably representing intracranial arteriosclerosis. A 3D rotational arteriogram was performed with reconstruction of the images on a separate workstation  confirming the aneurysm morphology. 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 Scattered mild to moderate intracranial arteriosclerotic changes involving the pericallosal anterior cerebral artery branches, and the superior division of the left middle cerebral artery, and the right posterior-inferior cerebellar artery. 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 PLAN:  Follow-up consult involving the management of the left posterior communicating artery region aneurysm. Electronically Signed   By: Julieanne Cotton M.D.   On: 12/23/2019 11:13   IR ANGIO VERTEBRAL SEL SUBCLAVIAN INNOMINATE UNI L MOD SED  Result Date: 12/28/2019 CLINICAL DATA:  Patient with history of stroke. Intermittent headaches. 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 EXAM: WORKSTATION 3D RECONSTRUCTION; IR ANGIO VERTEBRAL SEL SUBCLAVIAN INNOMINATE UNI LEFT MOD SED; IR ANGIO VERTEBRAL SEL VERTEBRAL UNI RIGHT MOD SED; BILATERAL COMMON CAROTID AND INNOMINATE ANGIOGRAPHY; IR ULTRASOUND GUIDANCE VASC ACCESS RIGHT COMPARISON:  CT angiogram of the head and neck of Oct 27, 2019 and MRI of the brain of Oct 26, 2019. MEDICATIONS: Heparin 2000 units IA; no antibiotic was administered within 1 hour of the procedure. ANESTHESIA/SEDATION: Versed 1 mg IV; Fentanyl 25 mcg IV Moderate Sedation Time:  57 minutes The patient was continuously monitored during the procedure by the interventional radiology nurse under my direct supervision. CONTRAST:  Isovue 300 approximately 65 mL FLUOROSCOPY TIME:  Fluoroscopy Time: 22 minutes 6 seconds (698 mGy). COMPLICATIONS: None immediate. TECHNIQUE: Informed written consent was obtained from the patient after a thorough discussion of the procedural risks, benefits and alternatives. All questions were addressed. Maximal Sterile Barrier Technique was utilized including caps, mask, sterile gowns, sterile gloves, sterile drape, hand hygiene and skin antiseptic. A timeout was performed prior to the initiation of the procedure. The right forearm to the wrist was prepped and draped in the usual sterile manner. The right radial artery morphology was then verified with ultrasound and documented. A dorsal palmar anastomosis was verified to be present. Using ultrasound guidance, transradial access was obtained with a micropuncture set over a 0.018  inch micro guidewire. A 4/5 French radial sheath was then inserted without difficulty. The obturator, and micro guidewire were removed. Good aspiration was obtained from the radial sheath. A cocktail of 2000 units of heparin, 2.5 mg of verapamil, and 200 mcg of nitroglycerin was then infused in diluted form without event. A right radial arteriogram was also obtained. Over a 0.035 Roadrunner guidewire, a 5 [place] Simmons 2 diagnostic catheter was advanced to the aortic arch region, and cannulations were performed of the right vertebral artery, the right common carotid artery, the left common carotid artery and the left subclavian arteries. Following the procedure, right radial puncture hemostasis was achieved with a wrist band. Distal right radial pulse was verified to be present. FINDINGS: The right common carotid arteriogram demonstrates the right external carotid artery and its major branches to be widely patent. The right internal carotid artery at the bulb to the cranial skull base also demonstrates wide patency. The petrous segment and the proximal cavernous segment are widely patent. There is diffuse segmental narrowing of the supraclinoid right ICA at the level of the ophthalmic artery with approximately 50% stenosis. The right middle cerebral artery and the anterior cerebral artery opacify into the capillary and venous phases. Mild caliber irregularity is seen of the right pericallosal artery probably also associated with intracranial arteriosclerosis. The right vertebral artery  origin is widely patent. The vessel opacifies to the cranial skull base. Wide patency is seen of the right vertebrobasilar junction and the right posterior-inferior cerebellar artery. The PICA demonstrates focal areas of caliber irregularity and mild narrowing in its proximal 1/3 most consistent with intracranial arteriosclerosis. The basilar artery, the right posterior cerebral artery, the superior cerebellar arteries and the  anterior-inferior cerebellar arteries opacify into the capillary and venous phases. Mild caliber irregularity and narrowing seen of both superior cerebellar arteries. Inflow of unopacified blood is seen in the basilar artery from the contralateral vertebral artery. The origin of the left vertebral artery is widely patent. The vessel opacifies to the cranial skull base. Wide patency is seen of the left vertebrobasilar junction and the left posterior-inferior cerebellar artery. The basilar artery, the right posterior cerebral artery, the superior cerebellar arteries and the anterior-inferior cerebellar arteries opacify into the capillary and venous phases. There appears to be delayed filling of the left posterior cerebral artery. The petrous and the proximal cavernous segments are widely patent. The left common carotid arteriogram demonstrates the left external carotid artery and its major branches to be widely patent. The left internal carotid artery at the bulb to the cranial skull base also demonstrates wide patency. Mild to moderate narrowing is seen of the of the supraclinoid left ICA proximal to the left posterior communicating artery region aneurysm. Approximately 5.5 mm x 3.5 mm moderate sized neck mild lobulated saccular aneurysm arising in the region of the left posterior communicating artery. Mild-to-moderate intracranial arteriosclerotic changes involving the anterior cerebral arteries, the pericallosal artery branches, and the superior division of the right middle cerebral artery, and the left saccular lobulated aneurysm is seen arising in the left posterior communicating artery region. A diminutive vessel is seen to arise in the region of the neck of the aneurysm. Just distal to this and also arising from the posterior wall of the left internal carotid artery is a prominent vessel that projects posteriorly supplying the distal left PCA distribution probably representing a dominant anterior choroidal  artery. The middle aspect of this vessel appears to have focal areas of caliber irregularity also probably representing intracranial arteriosclerosis. The left middle cerebral artery and the left anterior cerebral artery opacify into the capillary and venous phases. Again seen are mild to moderate areas of caliber irregularity involving the left pericallosal artery, and the M2 peri-insular branches of the superior division probably representing intracranial arteriosclerosis. A 3D rotational arteriogram was performed with reconstruction of the images on a separate workstation confirming the aneurysm morphology. 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 Scattered mild to moderate intracranial arteriosclerotic changes involving the pericallosal anterior cerebral artery branches, and the superior division of the left middle cerebral artery, and the right posterior-inferior cerebellar artery. 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 PLAN: Follow-up consult involving the management of the left posterior communicating artery region aneurysm. Electronically Signed   By: Julieanne Cotton M.D.   On: 12/23/2019 11:13   IR ANGIO VERTEBRAL SEL VERTEBRAL UNI R MOD SED  Result Date: 12/28/2019 CLINICAL DATA:  Patient with history of stroke. Intermittent headaches. 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 EXAM: WORKSTATION 3D RECONSTRUCTION; IR ANGIO VERTEBRAL SEL SUBCLAVIAN INNOMINATE UNI LEFT MOD SED; IR ANGIO VERTEBRAL SEL VERTEBRAL UNI RIGHT MOD SED; BILATERAL COMMON CAROTID AND INNOMINATE ANGIOGRAPHY; IR ULTRASOUND GUIDANCE  VASC ACCESS RIGHT COMPARISON:  CT angiogram of the head and neck of Oct 27, 2019 and MRI of the brain of Oct 26, 2019. MEDICATIONS: Heparin 2000 units IA; no antibiotic was  administered within 1 hour of the procedure. ANESTHESIA/SEDATION: Versed 1 mg IV; Fentanyl 25 mcg IV Moderate Sedation Time:  57 minutes The patient was continuously monitored during the procedure by the interventional radiology nurse under my direct supervision. CONTRAST:  Isovue 300 approximately 65 mL FLUOROSCOPY TIME:  Fluoroscopy Time: 22 minutes 6 seconds (698 mGy). COMPLICATIONS: None immediate. TECHNIQUE: Informed written consent was obtained from the patient after a thorough discussion of the procedural risks, benefits and alternatives. All questions were addressed. Maximal Sterile Barrier Technique was utilized including caps, mask, sterile gowns, sterile gloves, sterile drape, hand hygiene and skin antiseptic. A timeout was performed prior to the initiation of the procedure. The right forearm to the wrist was prepped and draped in the usual sterile manner. The right radial artery morphology was then verified with ultrasound and documented. A dorsal palmar anastomosis was verified to be present. Using ultrasound guidance, transradial access was obtained with a micropuncture set over a 0.018 inch micro guidewire. A 4/5 French radial sheath was then inserted without difficulty. The obturator, and micro guidewire were removed. Good aspiration was obtained from the radial sheath. A cocktail of 2000 units of heparin, 2.5 mg of verapamil, and 200 mcg of nitroglycerin was then infused in diluted form without event. A right radial arteriogram was also obtained. Over a 0.035 Roadrunner guidewire, a 5 [place] Simmons 2 diagnostic catheter was advanced to the aortic arch region, and cannulations were performed of the right vertebral artery, the right common carotid artery, the left common carotid artery and the left subclavian arteries. Following the procedure, right radial puncture hemostasis was achieved with a wrist band. Distal right radial pulse was verified to be present. FINDINGS: The right common carotid  arteriogram demonstrates the right external carotid artery and its major branches to be widely patent. The right internal carotid artery at the bulb to the cranial skull base also demonstrates wide patency. The petrous segment and the proximal cavernous segment are widely patent. There is diffuse segmental narrowing of the supraclinoid right ICA at the level of the ophthalmic artery with approximately 50% stenosis. The right middle cerebral artery and the anterior cerebral artery opacify into the capillary and venous phases. Mild caliber irregularity is seen of the right pericallosal artery probably also associated with intracranial arteriosclerosis. The right vertebral artery origin is widely patent. The vessel opacifies to the cranial skull base. Wide patency is seen of the right vertebrobasilar junction and the right posterior-inferior cerebellar artery. The PICA demonstrates focal areas of caliber irregularity and mild narrowing in its proximal 1/3 most consistent with intracranial arteriosclerosis. The basilar artery, the right posterior cerebral artery, the superior cerebellar arteries and the anterior-inferior cerebellar arteries opacify into the capillary and venous phases. Mild caliber irregularity and narrowing seen of both superior cerebellar arteries. Inflow of unopacified blood is seen in the basilar artery from the contralateral vertebral artery. The origin of the left vertebral artery is widely patent. The vessel opacifies to the cranial skull base. Wide patency is seen of the left vertebrobasilar junction and the left posterior-inferior cerebellar artery. The basilar artery, the right posterior cerebral artery, the superior cerebellar arteries and the anterior-inferior cerebellar arteries opacify into the capillary and venous phases. There appears to be delayed filling of the left posterior cerebral artery. The petrous and the  proximal cavernous segments are widely patent. The left common carotid  arteriogram demonstrates the left external carotid artery and its major branches to be widely patent. The left internal carotid artery at the bulb to the cranial skull base also demonstrates wide patency. Mild to moderate narrowing is seen of the of the supraclinoid left ICA proximal to the left posterior communicating artery region aneurysm. Approximately 5.5 mm x 3.5 mm moderate sized neck mild lobulated saccular aneurysm arising in the region of the left posterior communicating artery. Mild-to-moderate intracranial arteriosclerotic changes involving the anterior cerebral arteries, the pericallosal artery branches, and the superior division of the right middle cerebral artery, and the left saccular lobulated aneurysm is seen arising in the left posterior communicating artery region. A diminutive vessel is seen to arise in the region of the neck of the aneurysm. Just distal to this and also arising from the posterior wall of the left internal carotid artery is a prominent vessel that projects posteriorly supplying the distal left PCA distribution probably representing a dominant anterior choroidal artery. The middle aspect of this vessel appears to have focal areas of caliber irregularity also probably representing intracranial arteriosclerosis. The left middle cerebral artery and the left anterior cerebral artery opacify into the capillary and venous phases. Again seen are mild to moderate areas of caliber irregularity involving the left pericallosal artery, and the M2 peri-insular branches of the superior division probably representing intracranial arteriosclerosis. A 3D rotational arteriogram was performed with reconstruction of the images on a separate workstation confirming the aneurysm morphology. 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 Scattered mild to moderate intracranial  arteriosclerotic changes involving the pericallosal anterior cerebral artery branches, and the superior division of the left middle cerebral artery, and the right posterior-inferior cerebellar artery. 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 PLAN: Follow-up consult involving the management of the left posterior communicating artery region aneurysm. Electronically Signed   By: Julieanne CottonSanjeev  Deveshwar M.D.   On: 12/23/2019 11:13    Labs:  CBC: Recent Labs    10/26/19 1548 10/27/19 0521 12/23/19 0651  WBC 6.3 6.6 4.4  HGB 13.6 13.6 12.9  HCT 41.7 41.3 40.5  PLT 194 213 201    COAGS: Recent Labs    12/23/19 0651  INR 1.0  APTT 29    BMP: Recent Labs    10/26/19 1548 10/26/19 2025 10/27/19 0521 12/23/19 0651  NA 141 143 137 140  K 4.2 3.8 3.8 3.6  CL 106 107 101 106  CO2 25 25 24 25   GLUCOSE 111* 99 104* 110*  BUN 24* 23 21 21   CALCIUM 9.9 10.3 10.0 9.7  CREATININE 1.20* 1.14* 1.25* 1.33*  GFRNONAA 47* 50* 45* 42*  GFRAA 55* 58* 52* 48*    LIVER FUNCTION TESTS: No results for input(s): BILITOT, AST, ALT, ALKPHOS, PROT, ALBUMIN in the last 8760 hours.  TUMOR MARKERS: No results for input(s): AFPTM, CEA, CA199, CHROMGRNA in the last 8760 hours.  Assessment and Plan:  CVA 10/2019 Noted Left posterior communicating artery aneurysm on imaging Was seen in consultation with Dr Corliss Skainseveshwar 12/2019 Now scheduled for L PCOM aneurysm embolization Risks and benefits of cerebral angiogram with intervention were discussed with the patient including, but not limited to bleeding, infection, vascular injury, contrast induced renal failure, stroke or even death.  This interventional procedure involves the use of X-rays and because  of the nature of the planned procedure, it is possible that we will have prolonged use of X-ray fluoroscopy.  Potential radiation risks to you include (but are not limited to) the following: - A slightly elevated  risk for cancer  several years later in life. This risk is typically less than 0.5% percent. This risk is low in comparison to the normal incidence of human cancer, which is 33% for women and 50% for men according to the [employer]. - Radiation induced injury can include skin redness, resembling a rash, tissue breakdown / ulcers and hair loss (which can be temporary or permanent).   The likelihood of either of these occurring depends on the difficulty of the procedure and whether you are sensitive to radiation due to previous procedures, disease, or genetic conditions.   IF your procedure requires a prolonged use of radiation, you will be notified and given written instructions for further action.  It is your responsibility to monitor the irradiated area for the 2 weeks following the procedure and to notify your physician if you are concerned that you have suffered a radiation induced injury.    All of the patient's questions were answered, patient is agreeable to proceed.  Consent signed and in chart.  Pt is aware if intervention is performed, she will be admitted to Neuro ICU and kept overnight-- planned for discharge in am. She is agreeable  Thank you for this interesting consult.  I greatly enjoyed meeting [place] [facility] and look forward to participating in their care.  A copy of this report was sent to the requesting provider on this date.  Electronically Signed: Robet Leu, PA-C 01/19/2020, 7:30 AM   I spent a total of    25 Minutes in face to face in clinical consultation, greater than 50% of which was counseling/coordinating care for L PCOM aneurysm embolization

## 2020-01-20 ENCOUNTER — Other Ambulatory Visit (HOSPITAL_COMMUNITY): Payer: Self-pay | Admitting: Interventional Radiology

## 2020-01-20 ENCOUNTER — Encounter (HOSPITAL_COMMUNITY): Payer: Self-pay | Admitting: Interventional Radiology

## 2020-01-20 DIAGNOSIS — I671 Cerebral aneurysm, nonruptured: Secondary | ICD-10-CM

## 2020-01-20 HISTORY — PX: IR ANGIO INTRA EXTRACRAN SEL INTERNAL CAROTID UNI L MOD SED: IMG5361

## 2020-01-20 HISTORY — PX: IR TRANSCATH/EMBOLIZ: IMG695

## 2020-01-20 HISTORY — PX: IR ANGIOGRAM FOLLOW UP STUDY: IMG697

## 2020-01-20 LAB — CBC WITH DIFFERENTIAL/PLATELET
Abs Immature Granulocytes: 0.01 10*3/uL (ref 0.00–0.07)
Basophils Absolute: 0 10*3/uL (ref 0.0–0.1)
Basophils Relative: 0 %
Eosinophils Absolute: 0 10*3/uL (ref 0.0–0.5)
Eosinophils Relative: 0 %
HCT: 32.9 % — ABNORMAL LOW (ref 36.0–46.0)
Hemoglobin: 10.8 g/dL — ABNORMAL LOW (ref 12.0–15.0)
Immature Granulocytes: 0 %
Lymphocytes Relative: 11 %
Lymphs Abs: 0.6 10*3/uL — ABNORMAL LOW (ref 0.7–4.0)
MCH: 28.1 pg (ref 26.0–34.0)
MCHC: 32.8 g/dL (ref 30.0–36.0)
MCV: 85.7 fL (ref 80.0–100.0)
Monocytes Absolute: 0.2 10*3/uL (ref 0.1–1.0)
Monocytes Relative: 4 %
Neutro Abs: 4.6 10*3/uL (ref 1.7–7.7)
Neutrophils Relative %: 85 %
Platelets: 157 10*3/uL (ref 150–400)
RBC: 3.84 MIL/uL — ABNORMAL LOW (ref 3.87–5.11)
RDW: 12.2 % (ref 11.5–15.5)
WBC: 5.4 10*3/uL (ref 4.0–10.5)
nRBC: 0 % (ref 0.0–0.2)

## 2020-01-20 LAB — BASIC METABOLIC PANEL
Anion gap: 9 (ref 5–15)
BUN: 26 mg/dL — ABNORMAL HIGH (ref 8–23)
CO2: 20 mmol/L — ABNORMAL LOW (ref 22–32)
Calcium: 8.7 mg/dL — ABNORMAL LOW (ref 8.9–10.3)
Chloride: 110 mmol/L (ref 98–111)
Creatinine, Ser: 1.16 mg/dL — ABNORMAL HIGH (ref 0.44–1.00)
GFR calc Af Amer: 57 mL/min — ABNORMAL LOW (ref >60)
GFR calc non Af Amer: 49 mL/min — ABNORMAL LOW (ref >60)
Glucose, Bld: 113 mg/dL — ABNORMAL HIGH (ref 70–99)
Potassium: 4 mmol/L (ref 3.5–5.1)
Sodium: 139 mmol/L (ref 135–145)

## 2020-01-20 LAB — GLUCOSE, CAPILLARY: Glucose-Capillary: 139 mg/dL — ABNORMAL HIGH (ref 70–99)

## 2020-01-20 LAB — PLATELET INHIBITION P2Y12: Platelet Function  P2Y12: 163 [PRU] — ABNORMAL LOW (ref 182–335)

## 2020-01-20 MED ORDER — ASPIRIN 325 MG PO TABS: 325.0000 mg | ORAL_TABLET | Freq: Every day | ORAL | 3 refills | Status: DC

## 2020-01-20 NOTE — Plan of Care (Signed)

## 2020-01-20 NOTE — Discharge Summary (Signed)
Patient ID: Kendra Richardson MRN: [id number] DOB/AGE: 1956-05-29 64 y.o.  Admit date: 01/19/2020 Discharge date: 01/20/2020  Supervising Physician: Julieanne Cotton  Patient Status: [facility] - In-pt  Admission Diagnoses: Left COM aneurysm  Discharge Diagnoses:  Active Problems:   Brain aneurysm   Discharged Condition: good  Hospital Course:  Kendra Richardson is a 64 year old female with past medical history of CKD III, HTN, CHF with EF 30-35% by ECHO 07/22/19 who presented to [facility] ED 10/26/19 after episode of syncope at home possibly related to hypotensive episode resulting in decreased cerebral perfusion. Repeat ECHO during that time showed significant improvement in her EF to 50-60% although cardioembolic source of her strokes remained feasible.  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  She presented to [facility] yesterday for elective treatment of her aneurysm.  She is now s/p near complete obliteration of the L COM aneurysm with primary coiling.  She was admitted overnight for observation.  She has recovered well overnight.  She has been able to advance her diet this AM with tolerance.  She has ambulated in her room and has been able to void.  She does have some right hand swelling likely due to her IV. No neuro deficits noted. She is stable for discharge home.  She is given care instructions for her radial procedure site. She could stop taking Plavix at this time and increase aspirin to 325 mg daily.  Follow-up expected in 2 weeks. Schedulers will call with date and time of appointment.   Discharge Exam: Blood pressure (!) 141/94, pulse 77, temperature 100.3 F (37.9 C), temperature source Oral, resp. rate 12, height 5' 4.5" (1.638 m), weight 116 lb (52.6 kg), SpO2 97 %. General appearance: alert, cooperative and no distress Resp: clear to auscultation bilaterally Cardio: regular rate and rhythm, S1, S2 normal, no murmur, click, rub or gallop GI: soft,  non-tender; bowel sounds normal; no masses,  no organomegaly Pulses: 2+ and symmetric Incision/Wound: R wrist procedure site intact.  No bruising or tenderness.  Dressing clean and dry.   Disposition: Discharge disposition: 01-Home or Self Care       Discharge Instructions    Call MD for:  persistant nausea and vomiting   Complete by: As directed    Call MD for:  redness, tenderness, or signs of infection (pain, swelling, redness, odor or green/yellow discharge around incision site)   Complete by: As directed    Call MD for:  severe uncontrolled pain   Complete by: As directed    Call MD for:  temperature >100.4   Complete by: As directed    Diet - low sodium heart healthy   Complete by: As directed    Discharge instructions   Complete by: As directed    Drink plenty of water.  No bending, lifting, or stooping for 2 weeks.  No driving for 2 weeks.  May remove wrist bandage tomorrow.  Monitor for bruising.  Start taking aspirin 325 mg daily.  Follow-up with Dr. Corliss Skains in 2 weeks.  Schedulers will call you with date and time of appointment.   Increase activity slowly   Complete by: As directed    Remove dressing in 24 hours   Complete by: As directed      Allergies as of 01/20/2020   No Known Allergies     Medication List    STOP taking these medications   aspirin EC 81 MG tablet Replaced by: aspirin 325 MG tablet  clopidogrel 75 MG tablet Commonly known as: PLAVIX     TAKE these medications   amLODipine 10 MG tablet Commonly known as: NORVASC Take 10 mg by mouth daily.   aspirin 325 MG tablet Take 1 tablet (325 mg total) by mouth daily. Start taking on: January 21, 2020 Replaces: aspirin EC 81 MG tablet   carvedilol 25 MG tablet Commonly known as: COREG Take 1 tablet (25 mg total) by mouth 2 (two) times daily.   Entresto 97-103 MG Generic drug: sacubitril-valsartan Take 1 tablet by mouth 2 (two) times daily.   multivitamin tablet Take 1 tablet by  mouth daily.   rosuvastatin 5 MG tablet Commonly known as: CRESTOR Take 5 mg by mouth every Monday, Wednesday, and Friday.       Follow-up Information    Julieanne Cotton, MD Follow up.   Specialties: Interventional Radiology, Radiology Why: Schedulers will call you with date and time of appointment.  Contact information: [address] [place] [address] [phone]                Electronically Signed: Hoyt Koch, PA 01/20/2020, 9:33 AM   I have spent Greater Than 30 Minutes discharging [facility].

## 2020-01-21 ENCOUNTER — Telehealth (HOSPITAL_COMMUNITY): Payer: Self-pay | Admitting: Radiology

## 2020-01-21 ENCOUNTER — Other Ambulatory Visit (HOSPITAL_COMMUNITY): Payer: Self-pay | Admitting: Interventional Radiology

## 2020-01-21 DIAGNOSIS — I671 Cerebral aneurysm, nonruptured: Secondary | ICD-10-CM

## 2020-01-21 NOTE — Telephone Encounter (Signed)
Called pt, left VM for her to call to schedule 2 wk p/s aneurysm tx f/u. JM

## 2020-02-08 ENCOUNTER — Ambulatory Visit (HOSPITAL_COMMUNITY)
Admission: RE | Admit: 2020-02-08 | Discharge: 2020-02-08 | Disposition: A | Discharge status: Home / Self Care | Payer: No Typology Code available for payment source | Source: Ambulatory Visit | Attending: Interventional Radiology | Admitting: Interventional Radiology

## 2020-02-08 ENCOUNTER — Other Ambulatory Visit: Payer: Self-pay

## 2020-02-08 DIAGNOSIS — I671 Cerebral aneurysm, nonruptured: Secondary | ICD-10-CM

## 2020-02-08 NOTE — Consult Note (Signed)
Chief Complaint: Patient was seen in consultation today for follow-up to posttreatment a left posterior communicating artery region aneurysm.  Referring Physician(s): Desarie Feild  History of Present Illness: Kendra Richardson is a 64 y.o. 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 The patient is accompanied by her sister.  Clinically the patient reports no episodes of headaches nausea vomiting photophobia motor or sensory coordination difficulties.  She denies any seizure-like activity or of loss of consciousness.  The generalized weakness she fell right after procedure is gradually improving.  Patient is returning to her daily routine activities without any assistance.  She reports her appetite to be normal.  Her weight to be steady.  He denies any systemic cardiovascular respiratory GU GI pathologic symptomatology per  He denies any recent chills fever rigors.  She takes 325 mg of aspirin per day in addition to her other medications.  The patient today volunteered that her father also had a brain aneurysm. Past Medical History:  Diagnosis Date  . Abnormal radiographic examination   . CHF (congestive heart failure) (HCC)   . Chronic kidney disease, stage III (moderate)   . Chronic kidney disease, stage III (moderate)   . CN (constipation)   . Complication of anesthesia   . Essential hypertension, malignant   . HTN (hypertension)   . LVH (left ventricular hypertrophy)   . PONV (postoperative nausea and vomiting)   . Protein-calorie malnutrition (HCC)     Past Surgical History:  Procedure Laterality Date  . BREAST EXCISIONAL BIOPSY Left   . IR 3D INDEPENDENT WKST  12/23/2019  . IR ANGIO INTRA EXTRACRAN SEL COM CAROTID INNOMINATE BILAT MOD SED  12/23/2019  . IR ANGIO INTRA EXTRACRAN SEL  INTERNAL CAROTID UNI L MOD SED  01/20/2020  . IR ANGIO VERTEBRAL SEL SUBCLAVIAN INNOMINATE UNI L MOD SED  12/23/2019  . IR ANGIO VERTEBRAL SEL VERTEBRAL UNI R MOD SED  12/23/2019  . IR ANGIOGRAM FOLLOW UP STUDY  01/20/2020  . IR ANGIOGRAM FOLLOW UP STUDY  01/20/2020  . IR ANGIOGRAM FOLLOW UP STUDY  01/20/2020  . IR TRANSCATH/EMBOLIZ  01/20/2020  . IR US GUIDE VASC ACCESS RIGHT  12/23/2019  . RADIOLOGY WITH ANESTHESIA N/A 01/19/2020   Procedure: IR WITH ANESTHESIA  EMBOLIZATION;  Surgeon: Julieanne Cotton, MD;  Location: [facility] OR;  Service: Radiology;  Laterality: N/A;    Allergies: Patient has no known allergies.  Medications: Prior to Admission medications   Medication Sig Start Date End Date Taking? Authorizing Provider  amLODipine (NORVASC) 10 MG tablet Take 10 mg by mouth daily.    [provider]  aspirin 325 MG tablet Take 1 tablet (325 mg total) by mouth daily. 01/21/20   Hoyt Koch, PA  carvedilol (COREG) 25 MG tablet Take 1 tablet (25 mg total) by mouth 2 (two) times daily. 09/17/19   Jake Bathe, MD  Multiple Vitamin (MULTIVITAMIN) tablet Take 1 tablet by mouth daily.    [provider]  rosuvastatin (CRESTOR) 5 MG tablet Take 5 mg by mouth every Monday, Wednesday, and Friday.     [provider]  sacubitril-valsartan (ENTRESTO) 97-103 MG Take 1 tablet by mouth 2 (two) times daily. 08/19/19   Jake Bathe, MD     Family History  Problem Relation Age of Onset  . Breast cancer Mother   . Breast cancer Maternal Aunt   .  Stroke Father   . Kidney disease Father     Social History   Socioeconomic History  . Marital status: Single    Spouse name: Not on file  . Number of children: Not on file  . Years of education: Not on file  . Highest education level: Not on file  Occupational History  . Not on file  Tobacco Use  . Smoking status: Former Games developer  . Smokeless tobacco: Never Used  Vaping Use  . Vaping Use: Never assessed  Substance  and Sexual Activity  . Alcohol use: Not Currently  . Drug use: Not Currently  . Sexual activity: Not on file  Other Topics Concern  . Not on file  Social History Narrative  . Not on file   Social Determinants of Health   Financial Resource Strain:   . Difficulty of Paying Living Expenses:   Food Insecurity:   . Worried About Programme researcher, broadcasting/film/video in the Last Year:   . Barista in the Last Year:   Transportation Needs: No Transportation Needs  . Lack of Transportation (Medical): No  . Lack of Transportation (Non-Medical): No  Physical Activity:   . Days of Exercise per Week:   . Minutes of Exercise per Session:   Stress:   . Feeling of Stress :   Social Connections:   . Frequency of Communication with Friends and Family:   . Frequency of Social Gatherings with Friends and Family:   . Attends Religious Services:   . Active Member of Clubs or Organizations:   . Attends Banker Meetings:   Marland Kitchen Marital Status:      Review of Systems: A 12 point ROS discussed and pertinent positives are indicated in the HPI above.  All other systems are negative.  Review of Systems.  Negative unless as mentioned above  Vital Signs: There were no vitals taken for this visit.  Physical Exam.   Appears in no acute distress.  Affect appropriate.  Responses prompt.  Is alert awake oriented to time place space.  Speech and comprehension within normal limits.  Pupils not tested.   Eye movements and visual fields grossly intact.  No facial asymmetry.  Tongue midline.  Motor or sensory coordination grossly intact.  Station and gait normal.  Imaging: IR Transcath/Emboliz  Result Date: 01/24/2020 CLINICAL DATA:  Recent history of stroke. Headaches. 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 EXAM: TRANSCATHETER THERAPY EMBOLIZATION COMPARISON:  Diagnostic catheter arteriogram of December 23, 2019. MEDICATIONS: Heparin  3,000 units IV. Ancef 2 g IV antibiotic was administered within 1 hour of the procedure. ANESTHESIA/SEDATION: General anesthesia. CONTRAST:  Isovue 300 approximately 70 mL FLUOROSCOPY TIME:  Fluoroscopy Time: 40 minutes 12 seconds (763 mGy). COMPLICATIONS: None immediate. TECHNIQUE: Informed written consent was obtained from the patient after a thorough discussion of the procedural risks, benefits and alternatives. All questions were addressed. Maximal Sterile Barrier Technique was utilized including caps, mask, sterile gowns, sterile gloves, sterile drape, hand hygiene and skin antiseptic. A timeout was performed prior to the initiation of the procedure. The right forearm to the wrist was prepped and draped in the usual sterile manner. The right radial artery was then identified and its morphology was then verified with ultrasound and documented. A dorsal palmar anastomosis was verified to be present. Using ultrasound guidance, and over a 0.018 inch micro guidewire, a 6/7 French radial sheath was then inserted without difficulty. The micro guidewire, and the  obturator were removed. Good aspiration was obtained from the side port of the radial sheath. A cocktail of 2000 units of heparin, 2.5 mg of verapamil, and 200 mcg of nitroglycerin was then injected through the sheath without event. A right radial arteriogram was obtained. Over a 0.035 inch Roadrunner guidewire, a combination of a 105 cm 7 [place] RIST sheath inside of which was a 5 [place] Simmons 2 diagnostic support catheter was advanced to the aortic arch region. Access was obtained into the left common carotid artery with the catheter, catheter over the 0.035 inch Roadrunner guidewire followed by the advancement of the RIST radial sheath which was advanced to the left common carotid bifurcation. Simmons 2 catheter and the wire were removed. Good aspiration obtained from radial RIST sheath. Control arteriogram was then performed over the left common carotid  bifurcation, and the left anterior circulation. FINDINGS: The left common carotid bifurcation demonstrates the left external carotid artery and its major branches to be widely patent. The left internal carotid artery at the bulb to the cranial skull base demonstrates wide patency. The petrous, the cavernous and the supraclinoid segments are widely patent with an approximately 50% stenosis of the distal cavernous segment of the left internal carotid artery secondary to a smooth atherosclerotic plaque along the anterior wall of the internal carotid artery. Again demonstrated is an approximately 4.5 mm x 3.5 mm posterior communicating artery aneurysm projecting inferiorly and laterally. This has a moderate sized neck with the PCOM origin separate from the neck of the aneurysm. The left middle cerebral artery and the left anterior cerebral artery opacify into the capillary and venous phases. Mild to moderate intracranial arteriosclerotic changes are noted in the left middle cerebral artery inferior division, and left anterior cerebral artery pericallosal branch in the A3 region. Over a 0.035 inch Roadrunner guidewire, the 7 [place] sheath was advanced to the cervical petrous junction. The guidewire was removed. Good aspiration obtained from the hub of RIST guide sheath. A control arteriogram performed through this continued to demonstrate no change in the intracranial circulation. A Headway 2 tip 017 microcatheter was then used with slight J curvature. It was then advanced over a 0.14 inch standard Synchro micro guidewire with a J-tip configuration to the cavernous segment of the left internal carotid artery. Using biplane roadmap technique and constant fluoroscopic guidance, and a torque device, access into the aneurysm was obtained with the micro guidewire followed by the microcatheter. The guidewire was removed. Good aspiration obtained from the intra aneurysmal microcatheter. This was then connected to continuous  heparinized saline infusion. Embolization was then ready to begin. The first coil utilized was a 4 mm x 6 cm Target 360 Soft coil. This was purged with heparinized saline infusion in its housing. It was then advanced in a coaxial manner and with constant heparinized infusion to the tip of the intra aneurysmal microcatheter. It was then advanced into aneurysm with a stable frame being formed. A control arteriogram performed through the guide sheath demonstrated safe positioning of the coil to be detached. This was then detached. This was subsequently followed by the advancement of a 2 mm x 3 cm Target Helical Ultra Soft coil, and a 2 mm x 2 cm Micro Plex stent coil. Each of these coils was advanced into the aneurysm using biplane roadmap technique and constant fluoroscopic guidance. Prior to the detachment, a control arteriogram was performed to ensure stability of the coil mass. On the final coil, the microcatheter was then gently retrieved from the  coil mass without any change in the coil mass. A control arteriogram performed through the guide sheath in the left internal carotid artery continued to demonstrate near complete obliteration of the aneurysm with patency of the left posterior communicating artery. A control arteriogram performed centered intracranially demonstrated no change in the intracranial circulation. No evidence of intraluminal filling defects or of occlusion was seen. The guide sheath was retrieved and removed. Hemostasis at the right radial puncture site was achieved with a wrist band. Distal right radial pulse was verified to be present. During this time the patient's ACT was maintained in the region of 200 seconds. Patient was then extubated without difficulty. Upon recovery, the patient reported no evidence of headaches, nausea or vomiting or visual or motor or sensory symptoms. She was then transferred to the PACU and then neuro ICU. Overnight neurologically the patient remained stable. She  was able to enjoy clear liquids. Neurologically she remained stable. The wrist puncture site was also soft. Distal radial pulse was intact. She was then mobilized without difficulty. She was discharged home under the care of her sister with special instructions to maintain adequate hydration and to continue taking aspirin 325 mg a day and to stop the Plavix. She was advised to resume her home meds as prescribed by her primary care. She was advised to refrain from stooping, bending or lifting weights above 10 pounds for 2 weeks. Additionally she was advised to refrain from driving. She expressed understanding and agreement with the above management plan. 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 PLAN: Follow-up in the clinic 2-4 weeks post discharge. Electronically Signed   By: Julieanne Cotton M.D.   On: 01/21/2020 12:43   IR Angiogram Follow Up Study  Result Date: 01/24/2020 CLINICAL DATA:  Recent history of stroke. Headaches. 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 EXAM: TRANSCATHETER THERAPY EMBOLIZATION COMPARISON:  Diagnostic catheter arteriogram of December 23, 2019. MEDICATIONS: Heparin 3,000 units IV. Ancef 2 g IV antibiotic was administered within 1 hour of the procedure. ANESTHESIA/SEDATION: General anesthesia. CONTRAST:  Isovue 300 approximately 70 mL FLUOROSCOPY TIME:  Fluoroscopy Time: 40 minutes 12 seconds (763 mGy). COMPLICATIONS: None immediate. TECHNIQUE: Informed written consent was obtained from the patient after a thorough discussion of the procedural risks, benefits and alternatives. All questions were addressed. Maximal Sterile Barrier Technique was utilized including caps, mask, sterile gowns, sterile gloves, sterile drape, hand hygiene and skin antiseptic. A timeout was performed prior to the initiation of the procedure. The right  forearm to the wrist was prepped and draped in the usual sterile manner. The right radial artery was then identified and its morphology was then verified with ultrasound and documented. A dorsal palmar anastomosis was verified to be present. Using ultrasound guidance, and over a 0.018 inch micro guidewire, a 6/7 French radial sheath was then inserted without difficulty. The micro guidewire, and the obturator were removed. Good aspiration was obtained from the side port of the radial sheath. A cocktail of 2000 units of heparin, 2.5 mg of verapamil, and 200 mcg of nitroglycerin was then injected through the sheath without event. A right radial arteriogram was obtained. Over a 0.035 inch Roadrunner guidewire, a combination of a 105 cm 7 [place] RIST sheath inside of which was a 5 [place] Simmons 2 diagnostic support catheter was advanced to the aortic arch region. Access was obtained into the left common carotid artery with the catheter,  catheter over the 0.035 inch Roadrunner guidewire followed by the advancement of the RIST radial sheath which was advanced to the left common carotid bifurcation. Simmons 2 catheter and the wire were removed. Good aspiration obtained from radial RIST sheath. Control arteriogram was then performed over the left common carotid bifurcation, and the left anterior circulation. FINDINGS: The left common carotid bifurcation demonstrates the left external carotid artery and its major branches to be widely patent. The left internal carotid artery at the bulb to the cranial skull base demonstrates wide patency. The petrous, the cavernous and the supraclinoid segments are widely patent with an approximately 50% stenosis of the distal cavernous segment of the left internal carotid artery secondary to a smooth atherosclerotic plaque along the anterior wall of the internal carotid artery. Again demonstrated is an approximately 4.5 mm x 3.5 mm posterior communicating artery aneurysm projecting  inferiorly and laterally. This has a moderate sized neck with the PCOM origin separate from the neck of the aneurysm. The left middle cerebral artery and the left anterior cerebral artery opacify into the capillary and venous phases. Mild to moderate intracranial arteriosclerotic changes are noted in the left middle cerebral artery inferior division, and left anterior cerebral artery pericallosal branch in the A3 region. Over a 0.035 inch Roadrunner guidewire, the 7 [place] sheath was advanced to the cervical petrous junction. The guidewire was removed. Good aspiration obtained from the hub of RIST guide sheath. A control arteriogram performed through this continued to demonstrate no change in the intracranial circulation. A Headway 2 tip 017 microcatheter was then used with slight J curvature. It was then advanced over a 0.14 inch standard Synchro micro guidewire with a J-tip configuration to the cavernous segment of the left internal carotid artery. Using biplane roadmap technique and constant fluoroscopic guidance, and a torque device, access into the aneurysm was obtained with the micro guidewire followed by the microcatheter. The guidewire was removed. Good aspiration obtained from the intra aneurysmal microcatheter. This was then connected to continuous heparinized saline infusion. Embolization was then ready to begin. The first coil utilized was a 4 mm x 6 cm Target 360 Soft coil. This was purged with heparinized saline infusion in its housing. It was then advanced in a coaxial manner and with constant heparinized infusion to the tip of the intra aneurysmal microcatheter. It was then advanced into aneurysm with a stable frame being formed. A control arteriogram performed through the guide sheath demonstrated safe positioning of the coil to be detached. This was then detached. This was subsequently followed by the advancement of a 2 mm x 3 cm Target Helical Ultra Soft coil, and a 2 mm x 2 cm Micro Plex stent  coil. Each of these coils was advanced into the aneurysm using biplane roadmap technique and constant fluoroscopic guidance. Prior to the detachment, a control arteriogram was performed to ensure stability of the coil mass. On the final coil, the microcatheter was then gently retrieved from the coil mass without any change in the coil mass. A control arteriogram performed through the guide sheath in the left internal carotid artery continued to demonstrate near complete obliteration of the aneurysm with patency of the left posterior communicating artery. A control arteriogram performed centered intracranially demonstrated no change in the intracranial circulation. No evidence of intraluminal filling defects or of occlusion was seen. The guide sheath was retrieved and removed. Hemostasis at the right radial puncture site was achieved with a wrist band. Distal right radial pulse was verified to be  present. During this time the patient's ACT was maintained in the region of 200 seconds. Patient was then extubated without difficulty. Upon recovery, the patient reported no evidence of headaches, nausea or vomiting or visual or motor or sensory symptoms. She was then transferred to the PACU and then neuro ICU. Overnight neurologically the patient remained stable. She was able to enjoy clear liquids. Neurologically she remained stable. The wrist puncture site was also soft. Distal radial pulse was intact. She was then mobilized without difficulty. She was discharged home under the care of her sister with special instructions to maintain adequate hydration and to continue taking aspirin 325 mg a day and to stop the Plavix. She was advised to resume her home meds as prescribed by her primary care. She was advised to refrain from stooping, bending or lifting weights above 10 pounds for 2 weeks. Additionally she was advised to refrain from driving. She expressed understanding and agreement with the above management plan.  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 PLAN: Follow-up in the clinic 2-4 weeks post discharge. Electronically Signed   By: Julieanne Cotton M.D.   On: 01/21/2020 12:43   IR Angiogram Follow Up Study  Result Date: 01/24/2020 CLINICAL DATA:  Recent history of stroke. Headaches. 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 EXAM: TRANSCATHETER THERAPY EMBOLIZATION COMPARISON:  Diagnostic catheter arteriogram of December 23, 2019. MEDICATIONS: Heparin 3,000 units IV. Ancef 2 g IV antibiotic was administered within 1 hour of the procedure. ANESTHESIA/SEDATION: General anesthesia. CONTRAST:  Isovue 300 approximately 70 mL FLUOROSCOPY TIME:  Fluoroscopy Time: 40 minutes 12 seconds (763 mGy). COMPLICATIONS: None immediate. TECHNIQUE: Informed written consent was obtained from the patient after a thorough discussion of the procedural risks, benefits and alternatives. All questions were addressed. Maximal Sterile Barrier Technique was utilized including caps, mask, sterile gowns, sterile gloves, sterile drape, hand hygiene and skin antiseptic. A timeout was performed prior to the initiation of the procedure. The right forearm to the wrist was prepped and draped in the usual sterile manner. The right radial artery was then identified and its morphology was then verified with ultrasound and documented. A dorsal palmar anastomosis was verified to be present. Using ultrasound guidance, and over a 0.018 inch micro guidewire, a 6/7 French radial sheath was then inserted without difficulty. The micro guidewire, and the obturator were removed. Good aspiration was obtained from the side port of the radial sheath. A cocktail of 2000 units of heparin, 2.5 mg of verapamil, and 200 mcg of nitroglycerin was then injected through the sheath without event. A right radial arteriogram was  obtained. Over a 0.035 inch Roadrunner guidewire, a combination of a 105 cm 7 [place] RIST sheath inside of which was a 5 [place] Simmons 2 diagnostic support catheter was advanced to the aortic arch region. Access was obtained into the left common carotid artery with the catheter, catheter over the 0.035 inch Roadrunner guidewire followed by the advancement of the RIST radial sheath which was advanced to the left common carotid bifurcation. Simmons 2 catheter and the wire were removed. Good aspiration obtained from radial RIST sheath. Control arteriogram was then performed over the left common carotid bifurcation, and the left anterior circulation. FINDINGS: The left common carotid bifurcation demonstrates the left external carotid artery and its major branches to be widely patent. The left internal carotid artery at the bulb to the cranial skull base demonstrates wide patency.  The petrous, the cavernous and the supraclinoid segments are widely patent with an approximately 50% stenosis of the distal cavernous segment of the left internal carotid artery secondary to a smooth atherosclerotic plaque along the anterior wall of the internal carotid artery. Again demonstrated is an approximately 4.5 mm x 3.5 mm posterior communicating artery aneurysm projecting inferiorly and laterally. This has a moderate sized neck with the PCOM origin separate from the neck of the aneurysm. The left middle cerebral artery and the left anterior cerebral artery opacify into the capillary and venous phases. Mild to moderate intracranial arteriosclerotic changes are noted in the left middle cerebral artery inferior division, and left anterior cerebral artery pericallosal branch in the A3 region. Over a 0.035 inch Roadrunner guidewire, the 7 [place] sheath was advanced to the cervical petrous junction. The guidewire was removed. Good aspiration obtained from the hub of RIST guide sheath. A control arteriogram performed through this continued  to demonstrate no change in the intracranial circulation. A Headway 2 tip 017 microcatheter was then used with slight J curvature. It was then advanced over a 0.14 inch standard Synchro micro guidewire with a J-tip configuration to the cavernous segment of the left internal carotid artery. Using biplane roadmap technique and constant fluoroscopic guidance, and a torque device, access into the aneurysm was obtained with the micro guidewire followed by the microcatheter. The guidewire was removed. Good aspiration obtained from the intra aneurysmal microcatheter. This was then connected to continuous heparinized saline infusion. Embolization was then ready to begin. The first coil utilized was a 4 mm x 6 cm Target 360 Soft coil. This was purged with heparinized saline infusion in its housing. It was then advanced in a coaxial manner and with constant heparinized infusion to the tip of the intra aneurysmal microcatheter. It was then advanced into aneurysm with a stable frame being formed. A control arteriogram performed through the guide sheath demonstrated safe positioning of the coil to be detached. This was then detached. This was subsequently followed by the advancement of a 2 mm x 3 cm Target Helical Ultra Soft coil, and a 2 mm x 2 cm Micro Plex stent coil. Each of these coils was advanced into the aneurysm using biplane roadmap technique and constant fluoroscopic guidance. Prior to the detachment, a control arteriogram was performed to ensure stability of the coil mass. On the final coil, the microcatheter was then gently retrieved from the coil mass without any change in the coil mass. A control arteriogram performed through the guide sheath in the left internal carotid artery continued to demonstrate near complete obliteration of the aneurysm with patency of the left posterior communicating artery. A control arteriogram performed centered intracranially demonstrated no change in the intracranial circulation. No  evidence of intraluminal filling defects or of occlusion was seen. The guide sheath was retrieved and removed. Hemostasis at the right radial puncture site was achieved with a wrist band. Distal right radial pulse was verified to be present. During this time the patient's ACT was maintained in the region of 200 seconds. Patient was then extubated without difficulty. Upon recovery, the patient reported no evidence of headaches, nausea or vomiting or visual or motor or sensory symptoms. She was then transferred to the PACU and then neuro ICU. Overnight neurologically the patient remained stable. She was able to enjoy clear liquids. Neurologically she remained stable. The wrist puncture site was also soft. Distal radial pulse was intact. She was then mobilized without difficulty. She was discharged home under the  care of her sister with special instructions to maintain adequate hydration and to continue taking aspirin 325 mg a day and to stop the Plavix. She was advised to resume her home meds as prescribed by her primary care. She was advised to refrain from stooping, bending or lifting weights above 10 pounds for 2 weeks. Additionally she was advised to refrain from driving. She expressed understanding and agreement with the above management plan. 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 PLAN: Follow-up in the clinic 2-4 weeks post discharge. Electronically Signed   By: Julieanne Cotton M.D.   On: 01/21/2020 12:43   IR Angiogram Follow Up Study  Result Date: 01/24/2020 CLINICAL DATA:  Recent history of stroke. Headaches. 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 EXAM: TRANSCATHETER THERAPY EMBOLIZATION COMPARISON:  Diagnostic catheter arteriogram of December 23, 2019. MEDICATIONS: Heparin 3,000 units IV. Ancef 2 g IV antibiotic was administered within 1 hour  of the procedure. ANESTHESIA/SEDATION: General anesthesia. CONTRAST:  Isovue 300 approximately 70 mL FLUOROSCOPY TIME:  Fluoroscopy Time: 40 minutes 12 seconds (763 mGy). COMPLICATIONS: None immediate. TECHNIQUE: Informed written consent was obtained from the patient after a thorough discussion of the procedural risks, benefits and alternatives. All questions were addressed. Maximal Sterile Barrier Technique was utilized including caps, mask, sterile gowns, sterile gloves, sterile drape, hand hygiene and skin antiseptic. A timeout was performed prior to the initiation of the procedure. The right forearm to the wrist was prepped and draped in the usual sterile manner. The right radial artery was then identified and its morphology was then verified with ultrasound and documented. A dorsal palmar anastomosis was verified to be present. Using ultrasound guidance, and over a 0.018 inch micro guidewire, a 6/7 French radial sheath was then inserted without difficulty. The micro guidewire, and the obturator were removed. Good aspiration was obtained from the side port of the radial sheath. A cocktail of 2000 units of heparin, 2.5 mg of verapamil, and 200 mcg of nitroglycerin was then injected through the sheath without event. A right radial arteriogram was obtained. Over a 0.035 inch Roadrunner guidewire, a combination of a 105 cm 7 [place] RIST sheath inside of which was a 5 [place] Simmons 2 diagnostic support catheter was advanced to the aortic arch region. Access was obtained into the left common carotid artery with the catheter, catheter over the 0.035 inch Roadrunner guidewire followed by the advancement of the RIST radial sheath which was advanced to the left common carotid bifurcation. Simmons 2 catheter and the wire were removed. Good aspiration obtained from radial RIST sheath. Control arteriogram was then performed over the left common carotid bifurcation, and the left anterior circulation. FINDINGS: The left  common carotid bifurcation demonstrates the left external carotid artery and its major branches to be widely patent. The left internal carotid artery at the bulb to the cranial skull base demonstrates wide patency. The petrous, the cavernous and the supraclinoid segments are widely patent with an approximately 50% stenosis of the distal cavernous segment of the left internal carotid artery secondary to a smooth atherosclerotic plaque along the anterior wall of the internal carotid artery. Again demonstrated is an approximately 4.5 mm x 3.5 mm posterior communicating artery aneurysm projecting inferiorly and laterally. This has a moderate sized neck with the PCOM origin separate from the neck of the aneurysm. The left middle cerebral artery and the left anterior cerebral artery opacify into the capillary  and venous phases. Mild to moderate intracranial arteriosclerotic changes are noted in the left middle cerebral artery inferior division, and left anterior cerebral artery pericallosal branch in the A3 region. Over a 0.035 inch Roadrunner guidewire, the 7 [place] sheath was advanced to the cervical petrous junction. The guidewire was removed. Good aspiration obtained from the hub of RIST guide sheath. A control arteriogram performed through this continued to demonstrate no change in the intracranial circulation. A Headway 2 tip 017 microcatheter was then used with slight J curvature. It was then advanced over a 0.14 inch standard Synchro micro guidewire with a J-tip configuration to the cavernous segment of the left internal carotid artery. Using biplane roadmap technique and constant fluoroscopic guidance, and a torque device, access into the aneurysm was obtained with the micro guidewire followed by the microcatheter. The guidewire was removed. Good aspiration obtained from the intra aneurysmal microcatheter. This was then connected to continuous heparinized saline infusion. Embolization was then ready to begin. The  first coil utilized was a 4 mm x 6 cm Target 360 Soft coil. This was purged with heparinized saline infusion in its housing. It was then advanced in a coaxial manner and with constant heparinized infusion to the tip of the intra aneurysmal microcatheter. It was then advanced into aneurysm with a stable frame being formed. A control arteriogram performed through the guide sheath demonstrated safe positioning of the coil to be detached. This was then detached. This was subsequently followed by the advancement of a 2 mm x 3 cm Target Helical Ultra Soft coil, and a 2 mm x 2 cm Micro Plex stent coil. Each of these coils was advanced into the aneurysm using biplane roadmap technique and constant fluoroscopic guidance. Prior to the detachment, a control arteriogram was performed to ensure stability of the coil mass. On the final coil, the microcatheter was then gently retrieved from the coil mass without any change in the coil mass. A control arteriogram performed through the guide sheath in the left internal carotid artery continued to demonstrate near complete obliteration of the aneurysm with patency of the left posterior communicating artery. A control arteriogram performed centered intracranially demonstrated no change in the intracranial circulation. No evidence of intraluminal filling defects or of occlusion was seen. The guide sheath was retrieved and removed. Hemostasis at the right radial puncture site was achieved with a wrist band. Distal right radial pulse was verified to be present. During this time the patient's ACT was maintained in the region of 200 seconds. Patient was then extubated without difficulty. Upon recovery, the patient reported no evidence of headaches, nausea or vomiting or visual or motor or sensory symptoms. She was then transferred to the PACU and then neuro ICU. Overnight neurologically the patient remained stable. She was able to enjoy clear liquids. Neurologically she remained stable.  The wrist puncture site was also soft. Distal radial pulse was intact. She was then mobilized without difficulty. She was discharged home under the care of her sister with special instructions to maintain adequate hydration and to continue taking aspirin 325 mg a day and to stop the Plavix. She was advised to resume her home meds as prescribed by her primary care. She was advised to refrain from stooping, bending or lifting weights above 10 pounds for 2 weeks. Additionally she was advised to refrain from driving. She expressed understanding and agreement with the above management plan. 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 PLAN: Follow-up  in the clinic 2-4 weeks post discharge. Electronically Signed   By: Julieanne Cotton M.D.   On: 01/21/2020 12:43   IR ANGIO INTRA EXTRACRAN SEL INTERNAL CAROTID UNI L MOD SED  Result Date: 01/24/2020 CLINICAL DATA:  Recent history of stroke. Headaches. 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 EXAM: TRANSCATHETER THERAPY EMBOLIZATION COMPARISON:  Diagnostic catheter arteriogram of December 23, 2019. MEDICATIONS: Heparin 3,000 units IV. Ancef 2 g IV antibiotic was administered within 1 hour of the procedure. ANESTHESIA/SEDATION: General anesthesia. CONTRAST:  Isovue 300 approximately 70 mL FLUOROSCOPY TIME:  Fluoroscopy Time: 40 minutes 12 seconds (763 mGy). COMPLICATIONS: None immediate. TECHNIQUE: Informed written consent was obtained from the patient after a thorough discussion of the procedural risks, benefits and alternatives. All questions were addressed. Maximal Sterile Barrier Technique was utilized including caps, mask, sterile gowns, sterile gloves, sterile drape, hand hygiene and skin antiseptic. A timeout was performed prior to the initiation of the procedure. The right forearm to the wrist was prepped and  draped in the usual sterile manner. The right radial artery was then identified and its morphology was then verified with ultrasound and documented. A dorsal palmar anastomosis was verified to be present. Using ultrasound guidance, and over a 0.018 inch micro guidewire, a 6/7 French radial sheath was then inserted without difficulty. The micro guidewire, and the obturator were removed. Good aspiration was obtained from the side port of the radial sheath. A cocktail of 2000 units of heparin, 2.5 mg of verapamil, and 200 mcg of nitroglycerin was then injected through the sheath without event. A right radial arteriogram was obtained. Over a 0.035 inch Roadrunner guidewire, a combination of a 105 cm 7 [place] RIST sheath inside of which was a 5 [place] Simmons 2 diagnostic support catheter was advanced to the aortic arch region. Access was obtained into the left common carotid artery with the catheter, catheter over the 0.035 inch Roadrunner guidewire followed by the advancement of the RIST radial sheath which was advanced to the left common carotid bifurcation. Simmons 2 catheter and the wire were removed. Good aspiration obtained from radial RIST sheath. Control arteriogram was then performed over the left common carotid bifurcation, and the left anterior circulation. FINDINGS: The left common carotid bifurcation demonstrates the left external carotid artery and its major branches to be widely patent. The left internal carotid artery at the bulb to the cranial skull base demonstrates wide patency. The petrous, the cavernous and the supraclinoid segments are widely patent with an approximately 50% stenosis of the distal cavernous segment of the left internal carotid artery secondary to a smooth atherosclerotic plaque along the anterior wall of the internal carotid artery. Again demonstrated is an approximately 4.5 mm x 3.5 mm posterior communicating artery aneurysm projecting inferiorly and laterally. This has a moderate  sized neck with the PCOM origin separate from the neck of the aneurysm. The left middle cerebral artery and the left anterior cerebral artery opacify into the capillary and venous phases. Mild to moderate intracranial arteriosclerotic changes are noted in the left middle cerebral artery inferior division, and left anterior cerebral artery pericallosal branch in the A3 region. Over a 0.035 inch Roadrunner guidewire, the 7 [place] sheath was advanced to the cervical petrous junction. The guidewire was removed. Good aspiration obtained from the hub of RIST guide sheath. A control arteriogram performed through this continued to demonstrate no change in the intracranial circulation. A Headway 2 tip 017 microcatheter was then used with  slight J curvature. It was then advanced over a 0.14 inch standard Synchro micro guidewire with a J-tip configuration to the cavernous segment of the left internal carotid artery. Using biplane roadmap technique and constant fluoroscopic guidance, and a torque device, access into the aneurysm was obtained with the micro guidewire followed by the microcatheter. The guidewire was removed. Good aspiration obtained from the intra aneurysmal microcatheter. This was then connected to continuous heparinized saline infusion. Embolization was then ready to begin. The first coil utilized was a 4 mm x 6 cm Target 360 Soft coil. This was purged with heparinized saline infusion in its housing. It was then advanced in a coaxial manner and with constant heparinized infusion to the tip of the intra aneurysmal microcatheter. It was then advanced into aneurysm with a stable frame being formed. A control arteriogram performed through the guide sheath demonstrated safe positioning of the coil to be detached. This was then detached. This was subsequently followed by the advancement of a 2 mm x 3 cm Target Helical Ultra Soft coil, and a 2 mm x 2 cm Micro Plex stent coil. Each of these coils was advanced into the  aneurysm using biplane roadmap technique and constant fluoroscopic guidance. Prior to the detachment, a control arteriogram was performed to ensure stability of the coil mass. On the final coil, the microcatheter was then gently retrieved from the coil mass without any change in the coil mass. A control arteriogram performed through the guide sheath in the left internal carotid artery continued to demonstrate near complete obliteration of the aneurysm with patency of the left posterior communicating artery. A control arteriogram performed centered intracranially demonstrated no change in the intracranial circulation. No evidence of intraluminal filling defects or of occlusion was seen. The guide sheath was retrieved and removed. Hemostasis at the right radial puncture site was achieved with a wrist band. Distal right radial pulse was verified to be present. During this time the patient's ACT was maintained in the region of 200 seconds. Patient was then extubated without difficulty. Upon recovery, the patient reported no evidence of headaches, nausea or vomiting or visual or motor or sensory symptoms. She was then transferred to the PACU and then neuro ICU. Overnight neurologically the patient remained stable. She was able to enjoy clear liquids. Neurologically she remained stable. The wrist puncture site was also soft. Distal radial pulse was intact. She was then mobilized without difficulty. She was discharged home under the care of her sister with special instructions to maintain adequate hydration and to continue taking aspirin 325 mg a day and to stop the Plavix. She was advised to resume her home meds as prescribed by her primary care. She was advised to refrain from stooping, bending or lifting weights above 10 pounds for 2 weeks. Additionally she was advised to refrain from driving. She expressed understanding and agreement with the above management plan. IMPRESSION: Status post endovascular primary coiling  of left posterior communicating artery aneurysm with near complete obliteration. PLAN: Follow-up in the clinic 2-4 weeks post discharge. Electronically Signed   By: Julieanne Cotton M.D.   On: 01/21/2020 12:43    Labs:  CBC: Recent Labs    10/27/19 0521 12/23/19 0651 01/19/20 0734 01/20/20 0556  WBC 6.6 4.4 4.3 5.4  HGB 13.6 12.9 13.4 10.8*  HCT 41.3 40.5 42.1 32.9*  PLT 213 201 192 157    COAGS: Recent Labs    12/23/19 0651 01/19/20 0828  INR 1.0 1.0  APTT 29  --  BMP: Recent Labs    10/26/19 2025 10/27/19 0521 12/23/19 0651 01/20/20 0556  NA 143 137 140 139  K 3.8 3.8 3.6 4.0  CL 107 101 106 110  CO2 25 24 25  20*  GLUCOSE 99 104* 110* 113*  BUN 23 21 21  26*  CALCIUM 10.3 10.0 9.7 8.7*  CREATININE 1.14* 1.25* 1.33* 1.16*  GFRNONAA 50* 45* 42* 49*  GFRAA 58* 52* 48* 57*    LIVER FUNCTION TESTS: No results for input(s): BILITOT, AST, ALT, ALKPHOS, PROT, ALBUMIN in the last 8760 hours.  TUMOR MARKERS: No results for input(s): AFPTM, CEA, CA199, CHROMGRNA in the last 8760 hours.  Assessment and Plan:.  Status post endovascular primary call of unruptured left posterior communicating arteries and aneurysm on 01/19/2020.  No immediate or periprocedural complications.    Patient to continue taking aspirin 325 mg a day in addition to her other medications.  Patient advised to gradually increase her level of activity t as tolerated.  Again advised to maintain adequate hydration.  Patient may drive with supervision in the beginning.  Patient again advised against smoking.   Plan for follow-up.  Follow-up MRI of the brain and MRA of the brain in late November or early December of this year.  Informed patient that our schedulers will call to schedule the follow-up MRI/MRA of the brain.  Should the patient develop any strokelike symptoms such as speech difficulties, sudden severe headaches, motor weakness and incoordination or visual symptoms to call  911. All questions answered and concerns addressed. Patient conveys understanding and agrees with plan.  Thank you for this interesting consult.  I greatly enjoyed meeting [facility] and look forward to participating in their care.  A copy of this report was sent to the requesting provider on this date.  Electronically Signed: [facility], MD 02/08/2020, 1:22 PM   I spent a total of 30 minutes, and reviewing previous electronic in face to face in clinical consultation, greater than 50% of which was counseling/coordinating care and reviewing patient's electronic medical chart, and her neuroimaging studies.Oneal Grout

## 2020-02-16 ENCOUNTER — Other Ambulatory Visit: Payer: Self-pay | Admitting: *Deleted

## 2020-02-16 ENCOUNTER — Ambulatory Visit: Payer: Self-pay | Admitting: *Deleted

## 2020-02-16 NOTE — Patient Outreach (Signed)
[facility]) Care Management  02/16/2020  Kendra Richardson 1956-02-05 [id number]   RNCM will send patient case closure letter per Livia Snellen [facility] Care Management Assistant Clinical Director's request, no patient outreach needed at this time, and case remains closed.   Kendra Richardson H. Gardiner Barefoot, BSN, CCM [facility] Care Management [facility] Telephonic CM Phone: [phone] Fax: [phone]

## 2020-04-18 ENCOUNTER — Telehealth (HOSPITAL_COMMUNITY): Payer: Self-pay | Admitting: Radiology

## 2020-04-18 NOTE — Telephone Encounter (Signed)
Returned pt's call. She wanted to know when she is due for her next follow-up with Deveshwar. I told her she is due late November or December this year. She agrees with this. Will call to schedule her next month. JM

## 2020-04-21 ENCOUNTER — Other Ambulatory Visit: Payer: Self-pay | Admitting: Family Medicine

## 2020-04-21 DIAGNOSIS — Z1231 Encounter for screening mammogram for malignant neoplasm of breast: Secondary | ICD-10-CM

## 2020-05-01 ENCOUNTER — Ambulatory Visit: Payer: No Typology Code available for payment source | Attending: Internal Medicine

## 2020-05-01 DIAGNOSIS — Z23 Encounter for immunization: Secondary | ICD-10-CM

## 2020-05-01 NOTE — Progress Notes (Signed)
   Covid-19 Vaccination Clinic  Name:  Kendra Richardson    MRN: [id number] DOB: [birth year]  05/01/2020  [name] was observed post Covid-19 immunization for 15 minutes without incident. She was provided with Vaccine Information Sheet and instruction to access the V-Safe system.   [name] was instructed to call 911 with any severe reactions post vaccine: Marland Kitchen Difficulty breathing  . Swelling of face and throat  . A fast heartbeat  . A bad rash all over body  . Dizziness and weakness

## 2020-05-08 ENCOUNTER — Telehealth: Payer: Self-pay | Admitting: Cardiology

## 2020-05-08 NOTE — Telephone Encounter (Signed)
Call placed to pt and she has been made aware that she has enough meds until 07/2020 and 08/2020 of the requested prescriptions and that she can get her refills at her f/u appt in January for the year.

## 2020-05-08 NOTE — Telephone Encounter (Signed)
*  STAT* If patient is at the pharmacy, call can be transferred to refill team.   1. Which medications need to be refilled? (please list name of each medication and dose if known) Carvedilol and Entresto  2. Which pharmacy/location (including street and city if local pharmacy) is medication to be sent to? [facility] [address], [place]  3. Do they need a 30 day or 90 day supply? 180 and refills

## 2020-05-23 ENCOUNTER — Telehealth: Payer: Self-pay | Admitting: Adult Health

## 2020-05-23 NOTE — Telephone Encounter (Signed)
Noreene Larsson from [facility] called to state pt. was talking about her Surgeon, not our office. Sorry about the confusion.

## 2020-05-23 NOTE — Telephone Encounter (Signed)
[facility] Kendra Richardson) called, Pt said she was called by the pharmacy to pick up Plavix prescription. She went and got the prescription because it was free. Pt said she was told to stop taking Plavix, but someone sent in a prescription. We do not know if she needs to be on this medication.

## 2020-05-23 NOTE — Telephone Encounter (Signed)
Noted  

## 2020-06-01 ENCOUNTER — Ambulatory Visit (INDEPENDENT_AMBULATORY_CARE_PROVIDER_SITE_OTHER): Payer: No Typology Code available for payment source | Admitting: Adult Health

## 2020-06-01 ENCOUNTER — Encounter: Payer: Self-pay | Admitting: Adult Health

## 2020-06-01 VITALS — BP 144/85 | HR 68 | Ht 64.0 in | Wt 136.0 lb

## 2020-06-01 DIAGNOSIS — I63512 Cerebral infarction due to unspecified occlusion or stenosis of left middle cerebral artery: Secondary | ICD-10-CM | POA: Diagnosis not present

## 2020-06-01 DIAGNOSIS — E785 Hyperlipidemia, unspecified: Secondary | ICD-10-CM | POA: Diagnosis not present

## 2020-06-01 DIAGNOSIS — I671 Cerebral aneurysm, nonruptured: Secondary | ICD-10-CM | POA: Diagnosis not present

## 2020-06-01 DIAGNOSIS — I10 Essential (primary) hypertension: Secondary | ICD-10-CM

## 2020-06-01 NOTE — Patient Instructions (Signed)
Continue aspirin 325 mg daily  and Crestor  for secondary stroke prevention  Continue to do exercises for shoulder pain. If this doesn't improve, further discuss with PCP for possible evaluation by orthopedics  Ensure imaging is scheduled with Dr. Corliss Skains - I will attempt to reach out to his scheduling assistant  Continue to follow up with PCP regarding cholesterol and blood pressure management  Maintain strict control of hypertension with blood pressure goal below 130/90 and cholesterol with LDL cholesterol (bad cholesterol) goal below 70 mg/dL.       Followup in the future with me in 6 months or call earlier if needed       Thank you for coming to see [place] at [facility]. I hope we have been able to provide you high quality care today.  You may receive a patient satisfaction survey over the next few weeks. We would appreciate your feedback and comments so that we may continue to improve ourselves and the health of our patients.

## 2020-06-01 NOTE — Progress Notes (Signed)
[facility] [address] [place]. [place] [id number] [phone]       STROKE FOLLOW UP NOTE  [name] Date of Birth:  [birth year] Medical Record Number:  [id number]   Reason for Referral: stroke follow up    SUBJECTIVE:   CHIEF COMPLAINT:  Chief Complaint  Patient presents with  . Follow-up    Rm 9, with sister, pt states she is doing well     HPI:   Today, 06/01/2020, Kendra Richardson returns for 6133-month stroke follow-up.  Stable since prior visit without new or reoccurring stroke/TIA symptoms.  Evaluated by Dr. 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  She was advised to discontinue Plavix and increase aspirin to 325 mg daily.  Recommended MRI brain and MRA head late November/early December -patient currently awaiting for call to schedule.  She has been doing well since procedure but does report mild right shoulder pain and decreased ROM with onset 2 to 3 weeks post procedure.  She has been doing exercises and has been slowly improving.  Remains on full dose aspirin without bleeding or bruising.  Remains on Crestor without myalgias.  Blood pressure today 144/85. Monitors at home and has been stable typically 110-120/80s.  No further concerns.   History provided for reference purposes only Initial visit 12/01/2019 JM: Kendra Richardson is being seen for hospital follow-up accompanied by her sister.  She has been doing since discharge without residual deficits and has returned back to baseline.  Initially experiencing headaches which have since improved.  She questions possible return to driving.  She has continued on DAPT without bleeding or bruising.  Experienced statin myalgias on atorvastatin therefore discontinued and initiated Crestor with resolution of myalgias.  Blood pressure today 114/79.  Currently wearing  cardiac monitor which will be completed next week.  Follow-up with Dr. Corliss Skainseveshwar on 11/05/2019 with patient wishing to proceed with diagnostic angiogram with intent to treat left PCOM aneurysm.  Per sister, they have attempted to contact office but have not received a return call.  No further concerns at this time.  Stroke admission 10/26/2019 Kendra Richardson is a 64 y.o. female with history of systolic heart failure, CKD 3, hypertension  who presented [facility] 10/26/[facility] with syncope / LOC with resultant blurred and double vision, HA.  Stroke work-up revealed 2 punctate MCA cortical infarcts (left frontal lobe) most likely secondary to large vessel disease source in the setting of syncopal event although cardioembolic source cannot be completely ruled out.  CTA showed moderate to advanced intracranial atherosclerosis and left PCOM aneurysm 5 mm.  Recommended DAPT for 3 months then Plavix alone due to intracranial stenosis.  Recommended 30-day cardiac event monitor outpatient to follow AF.  Syncopal episode possibly related to BP medication transition without further syncopal events during admission.  History of HTN stable but elevated during admission currently being managed by cardiology.  History of cardiomyopathy with 2D echo 50 to 55% (prior 30 to 35%) with ongoing follow-up by cardiology.  LDL 101 initiate atorvastatin 40 mg daily.  Will follow up outpatient with Dr. Corliss Skainseveshwar in regards to cerebral aneurysm.  Other stroke risk factors include advanced age, former tobacco use, family history of stroke and chronic systolic CHF but no prior stroke history.  Other active problems include CKD stage III.  Evaluated by therapies and recommended outpatient PT and discharged home in stable  condition.  Stroke: 2 punctate MCA cortical infarcts most likely secondary to large vessel disease source in the setting of syncopal episode.  Cardioembolic source cannot be can be ruled  out.  MRI  2 punctate L frontal lobe infarcts. Moderate small vessel disease. Scattered chronic microhemorrhages.  CTA head & neck no LVO. Moderate to advanced intracranial atherosclerosis (L P1 severe, B P2 stenoses, B ICA siphon stenoses, L MCA and B PCA small vessel atherosclerosis). L PCOM aneurysm 37mm.  2D Echo EF 50 to 50%  LDL 101  HgbA1c 5.6  Lovenox 40 mg sq daily for VTE prophylaxis  aspirin 81 mg daily prior to admission, now on aspirin 325 mg daily and clopidogrel 75 mg daily. Continue DAPT x 3 months then plavix alone given intracranial stenosis  Consider 30-day cardiac event monitoring to rule out AF  Therapy recommendations:  OP PT, no SLP  Disposition:  home       ROS:   14 system review of systems performed and negative with exception of right shoulder pain  PMH:  Past Medical History:  Diagnosis Date  . Abnormal radiographic examination   . CHF (congestive heart failure) (HCC)   . Chronic kidney disease, stage III (moderate) (HCC)   . Chronic kidney disease, stage III (moderate) (HCC)   . CN (constipation)   . Complication of anesthesia   . Essential hypertension, malignant   . HTN (hypertension)   . LVH (left ventricular hypertrophy)   . PONV (postoperative nausea and vomiting)   . Protein-calorie malnutrition (HCC)     PSH:  Past Surgical History:  Procedure Laterality Date  . BREAST EXCISIONAL BIOPSY Left   . IR 3D INDEPENDENT WKST  12/23/2019  . IR ANGIO INTRA EXTRACRAN SEL COM CAROTID INNOMINATE BILAT MOD SED  12/23/2019  . IR ANGIO INTRA EXTRACRAN SEL INTERNAL CAROTID UNI L MOD SED  01/20/2020  . IR ANGIO VERTEBRAL SEL SUBCLAVIAN INNOMINATE UNI L MOD SED  12/23/2019  . IR ANGIO VERTEBRAL SEL VERTEBRAL UNI R MOD SED  12/23/2019  . IR ANGIOGRAM FOLLOW UP STUDY  01/20/2020  . IR ANGIOGRAM FOLLOW UP STUDY  01/20/2020  . IR ANGIOGRAM FOLLOW UP STUDY  01/20/2020  . IR TRANSCATH/EMBOLIZ  01/20/2020  . IR US GUIDE VASC ACCESS RIGHT  12/23/2019  . RADIOLOGY  WITH ANESTHESIA N/A 01/19/2020   Procedure: IR WITH ANESTHESIA  EMBOLIZATION;  Surgeon: Julieanne Cotton, MD;  Location: [facility] OR;  Service: Radiology;  Laterality: N/A;    Social History:  Social History   Socioeconomic History  . Marital status: Single    Spouse name: Not on file  . Number of children: Not on file  . Years of education: Not on file  . Highest education level: Not on file  Occupational History  . Not on file  Tobacco Use  . Smoking status: Former Games developer  . Smokeless tobacco: Never Used  Vaping Use  . Vaping Use: Not on file  Substance and Sexual Activity  . Alcohol use: Not Currently  . Drug use: Not Currently  . Sexual activity: Not on file  Other Topics Concern  . Not on file  Social History Narrative  . Not on file   Social Determinants of Health   Financial Resource Strain: Not on file  Food Insecurity: Not on file  Transportation Needs: No Transportation Needs  . Lack of Transportation (Medical): No  . Lack of Transportation (Non-Medical): No  Physical Activity: Not on file  Stress: Not on file  Social  Connections: Not on file  Intimate Partner Violence: Not on file    Family History:  Family History  Problem Relation Age of Onset  . Breast cancer Mother   . Breast cancer Maternal Aunt   . Stroke Father   . Kidney disease Father     Medications:   Current Outpatient Medications on File Prior to Visit  Medication Sig Dispense Refill  . amLODipine (NORVASC) 10 MG tablet Take 10 mg by mouth daily.    Marland Kitchen aspirin 325 MG tablet Take 1 tablet (325 mg total) by mouth daily. 30 tablet 3  . carvedilol (COREG) 25 MG tablet Take 1 tablet (25 mg total) by mouth 2 (two) times daily. 180 tablet 3  . Multiple Vitamin (MULTIVITAMIN) tablet Take 1 tablet by mouth daily.    . rosuvastatin (CRESTOR) 5 MG tablet Take 5 mg by mouth every Monday, Wednesday, and Friday.     . sacubitril-valsartan (ENTRESTO) 97-103 MG Take 1 tablet by mouth 2 (two) times daily.  180 tablet 3   No current facility-administered medications on file prior to visit.    Allergies:  No Known Allergies    OBJECTIVE:  Physical Exam  Vitals:   06/01/20 1241  BP: (!) 144/85  Pulse: 68  Weight: 136 lb (61.7 kg)  Height: 5\' 4"  (1.626 m)   Body mass index is 23.34 kg/m. No exam data present  No flowsheet data found.   General: well developed, well nourished,  pleasant middle-aged African-American female, seated, in no evident distress Head: head normocephalic and atraumatic.   Neck: supple with no carotid or supraclavicular bruits Cardiovascular: regular rate and rhythm, no murmurs Musculoskeletal: no deformity; decreased right shoulder ROM w/o evidence of weakness Skin:  no rash/petichiae Vascular:  Normal pulses all extremities   Neurologic Exam Mental Status: Awake and fully alert.   Fluent speech and language.  Oriented to place and time. Recent and remote memory intact. Attention span, concentration and fund of knowledge appropriate. Mood and affect appropriate.  Cranial Nerves: Fundoscopic exam reveals sharp disc margins. Pupils equal, briskly reactive to light. Extraocular movements full without nystagmus. Visual fields full to confrontation. Hearing intact. Facial sensation intact. Face, tongue, palate moves normally and symmetrically.  Motor: Normal bulk and tone. Normal strength in all tested extremity muscles. Sensory.: intact to touch , pinprick , position and vibratory sensation.  Coordination: Rapid alternating movements normal in all extremities. Finger-to-nose and heel-to-shin performed accurately bilaterally. Gait and Station: Arises from chair without difficulty. Stance is normal. Gait demonstrates normal stride length and balance without use of assistive device Reflexes: 1+ and symmetric. Toes downgoing.         ASSESSMENT: Kendra Richardson is a 64 y.o. year old female presented with syncope/loss of consciousness with  resultant blurred and double vision and headache on 10/26/2019 with stroke work-up revealing 2 punctate MCA cortical infarcts most likely secondary to large vessel disease source in setting of syncopal episode although cardioembolic source cannot be completely ruled out.  Vascular risk factors include intracranial stenosis, HTN, HLD, cardiomyopathy, CHF, L PCOM aneurysm 5 mm s/p coiling 12/2019 and former tobacco use.       PLAN:  1. Left MCA stroke: Recovered well without residual deficits.  Continue clopidogrel 75 mg daily  and Crestor for secondary stroke prevention.  Discussed secondary stroke prevention measures and importance of close PCP follow-up for aggressive stroke risk factor management 2. Cerebral aneurysm: s/p near complete obliteration with primary coiling 01/19/2020 by Dr. 01/21/2020. Per IR  recommendations, Plavix discontinued and increase aspirin to full dose 325 mg daily.  Needs f/u MR brain and MRA head scheduled - will attempt to reach out to Dr. Corliss Skains scheduler for further assistance 3. HTN: BP goal<130/90.  Stable on Entresto, carvedilol and amlodipine 4. HLD: LDL goal<70.  On Crestor 5 mg MWF per PCP 5. Right shoulder pain: onset 3-4 weeks post aneurysm procedure.  She did have lifting and and range of motion precautions for 2 to 3 weeks after procedure.  Pain has been gradually improving with exercises and stretching -continuation encouraged.  Advised to further speak with Dr. Corliss Skains or PCP for further evaluation    Follow up in 6 months or call earlier if needed   I spent 30 minutes of face-to-face and non-face-to-face time with patient and sister.  This included previsit chart review, lab review, study review, order entry, electronic health record documentation, patient education regarding stroke, recent cerebral aneurysm procedure and right shoulder pain, importance of managing stroke risk factors and answered all questions to patient and sisters  satisfaction   Ihor Austin, AGNP-BC  [facility] [address] [place], [place] [address]  Phone [phone] Fax [phone] Note: This document was prepared with digital dictation and possible smart phrase technology. Any transcriptional errors that result from this process are unintentional.

## 2020-06-01 NOTE — Progress Notes (Signed)
I agree with the above plan 

## 2020-06-05 ENCOUNTER — Ambulatory Visit: Payer: No Typology Code available for payment source

## 2020-06-20 ENCOUNTER — Other Ambulatory Visit (HOSPITAL_COMMUNITY): Payer: Self-pay | Admitting: Interventional Radiology

## 2020-06-20 ENCOUNTER — Telehealth (HOSPITAL_COMMUNITY): Payer: Self-pay

## 2020-06-20 DIAGNOSIS — I671 Cerebral aneurysm, nonruptured: Secondary | ICD-10-CM

## 2020-06-20 NOTE — Telephone Encounter (Signed)
Called to schedule mri, no answer, left vm. AW 

## 2020-06-20 NOTE — Telephone Encounter (Signed)
Called to schedule mri, no answer, left vm. AW 

## 2020-07-07 ENCOUNTER — Encounter: Payer: Self-pay | Admitting: Cardiology

## 2020-07-07 ENCOUNTER — Ambulatory Visit (INDEPENDENT_AMBULATORY_CARE_PROVIDER_SITE_OTHER): Payer: No Typology Code available for payment source | Admitting: Cardiology

## 2020-07-07 ENCOUNTER — Other Ambulatory Visit: Payer: Self-pay

## 2020-07-07 VITALS — BP 140/90 | HR 73 | Ht 64.0 in | Wt 139.0 lb

## 2020-07-07 DIAGNOSIS — I509 Heart failure, unspecified: Secondary | ICD-10-CM

## 2020-07-07 DIAGNOSIS — I10 Essential (primary) hypertension: Secondary | ICD-10-CM

## 2020-07-07 MED ORDER — ENTRESTO 97-103 MG PO TABS: 1.0000 | ORAL_TABLET | Freq: Two times a day (BID) | ORAL | 3 refills | Status: DC

## 2020-07-07 MED ORDER — CARVEDILOL 25 MG PO TABS: 25.0000 mg | ORAL_TABLET | Freq: Two times a day (BID) | ORAL | 3 refills | Status: AC

## 2020-07-07 NOTE — Patient Instructions (Signed)
Medication Instructions:  No changes. *If you need a refill on your cardiac medications before your next appointment, please call your pharmacy*   Lab Work: none If you have labs (blood work) drawn today and your tests are completely normal, you will receive your results only by: . MyChart Message (if you have MyChart) OR . A paper copy in the mail If you have any lab test that is abnormal or we need to change your treatment, we will call you to review the results.   Testing/Procedures: none   Follow-Up: At [facility], you and your health needs are our priority.  As part of our continuing mission to provide you with exceptional heart care, we have created designated Provider Care Teams.  These Care Teams include your primary Cardiologist (physician) and Advanced Practice Providers (APPs -  Physician Assistants and Nurse Practitioners) who all work together to provide you with the care you need, when you need it.  We recommend signing up for the patient portal called "MyChart".  Sign up information is provided on this After Visit Summary.  MyChart is used to connect with patients for Virtual Visits (Telemedicine).  Patients are able to view lab/test results, encounter notes, upcoming appointments, etc.  Non-urgent messages can be sent to your provider as well.   To learn more about what you can do with MyChart, go to [email or web address].    Your next appointment:   6 month(s)  The format for your next appointment:   In Person  Provider:   Mark Skains, MD   Other Instructions none   

## 2020-07-07 NOTE — Progress Notes (Signed)
Cardiology Office Note:    Date:  07/07/2020   ID:  Kendra Richardson, [name] Aug 30, 1955, MRN [id number]  PCP:  Daisy Floro, MD  [facility] Cardiologist:  Donato Schultz, MD  [facility] Electrophysiologist:  None   Referring MD: Daisy Floro, MD    History of Present Illness:    Kendra Richardson is a 65 y.o. female here for the follow-up of difficult to control hypertension, systolic heart failure.  Has been seen by the pharmacy lead hypertension/heart failure clinic here.  Overall been quite well.  Feels good NYHA class I.  Past Medical History:  Diagnosis Date  . Abnormal radiographic examination   . CHF (congestive heart failure) (HCC)   . Chronic kidney disease, stage III (moderate) (HCC)   . Chronic kidney disease, stage III (moderate) (HCC)   . CN (constipation)   . Complication of anesthesia   . Essential hypertension, malignant   . HTN (hypertension)   . LVH (left ventricular hypertrophy)   . PONV (postoperative nausea and vomiting)   . Protein-calorie malnutrition (HCC)     Past Surgical History:  Procedure Laterality Date  . BREAST EXCISIONAL BIOPSY Left   . IR 3D INDEPENDENT WKST  12/23/2019  . IR ANGIO INTRA EXTRACRAN SEL COM CAROTID INNOMINATE BILAT MOD SED  12/23/2019  . IR ANGIO INTRA EXTRACRAN SEL INTERNAL CAROTID UNI L MOD SED  01/20/2020  . IR ANGIO VERTEBRAL SEL SUBCLAVIAN INNOMINATE UNI L MOD SED  12/23/2019  . IR ANGIO VERTEBRAL SEL VERTEBRAL UNI R MOD SED  12/23/2019  . IR ANGIOGRAM FOLLOW UP STUDY  01/20/2020  . IR ANGIOGRAM FOLLOW UP STUDY  01/20/2020  . IR ANGIOGRAM FOLLOW UP STUDY  01/20/2020  . IR TRANSCATH/EMBOLIZ  01/20/2020  . IR US GUIDE VASC ACCESS RIGHT  12/23/2019  . RADIOLOGY WITH ANESTHESIA N/A 01/19/2020   Procedure: IR WITH ANESTHESIA  EMBOLIZATION;  Surgeon: Julieanne Cotton, MD;  Location: [facility] OR;  Service: Radiology;  Laterality: N/A;    Current Medications: Current Meds  Medication Sig  . amLODipine  (NORVASC) 10 MG tablet Take 10 mg by mouth daily.  Marland Kitchen aspirin 325 MG tablet Take 1 tablet (325 mg total) by mouth daily.  . Multiple Vitamin (MULTIVITAMIN) tablet Take 1 tablet by mouth daily.  . rosuvastatin (CRESTOR) 5 MG tablet Take 5 mg by mouth every Monday, Wednesday, and Friday.   . [DISCONTINUED] carvedilol (COREG) 25 MG tablet Take 1 tablet (25 mg total) by mouth 2 (two) times daily.  . [DISCONTINUED] sacubitril-valsartan (ENTRESTO) 97-103 MG Take 1 tablet by mouth 2 (two) times daily.     Allergies:   Patient has no known allergies.   Social History   Socioeconomic History  . Marital status: Single    Spouse name: Not on file  . Number of children: Not on file  . Years of education: Not on file  . Highest education level: Not on file  Occupational History  . Not on file  Tobacco Use  . Smoking status: Former Games developer  . Smokeless tobacco: Never Used  Vaping Use  . Vaping Use: Not on file  Substance and Sexual Activity  . Alcohol use: Not Currently  . Drug use: Not Currently  . Sexual activity: Not on file  Other Topics Concern  . Not on file  Social History Narrative  . Not on file   Social Determinants of Health   Financial Resource Strain: Not on file  Food Insecurity: Not on file  Transportation  Needs: No Transportation Needs  . Lack of Transportation (Medical): No  . Lack of Transportation (Non-Medical): No  Physical Activity: Not on file  Stress: Not on file  Social Connections: Not on file     Family History: The patient's family history includes Breast cancer in her maternal aunt and mother; Kidney disease in her father; Stroke in her father.  ROS:   Please see the history of present illness.     All other systems reviewed and are negative.  EKGs/Labs/Other Studies Reviewed:    The following studies were reviewed today:  Echocardiogram 07/22/2019-EF 30 to 35% ECHO 10/27/19:   1. Left ventricular ejection fraction, by estimation, is 50 to 55%.  The  left ventricle has low normal function. The left ventricle has no regional  wall motion abnormalities. Left ventricular diastolic parameters are  consistent with Grade I diastolic  dysfunction (impaired relaxation).  2. Right ventricular systolic function is normal. The right ventricular  size is normal. There is normal pulmonary artery systolic pressure.  3. The mitral valve is normal in structure. No evidence of mitral valve  regurgitation. No evidence of mitral stenosis.  4. The aortic valve is normal in structure. Aortic valve regurgitation is  not visualized. No aortic stenosis is present.  5. The inferior vena cava is normal in size with greater than 50%  respiratory variability, suggesting right atrial pressure of 3 mmHg.   Event monitor 12/09/19:   Sinus rhythm average HR 70 BPM  No atrial fibrillation, no pauses  Two brief episodes of non sustained ventricular tachycardia, 4 beats and 10 beats, both asymptomatic  Rare PAC, PVC   Recent ECHO EF low normal 50-55% Continue with coreg, beta blocker. No changes.  Donato Schultz, MD    Recent Labs: 01/20/2020: BUN 26; Creatinine, Ser 1.16; Hemoglobin 10.8; Platelets 157; Potassium 4.0; Sodium 139  Recent Lipid Panel    Component Value Date/Time   CHOL 207 (H) 10/27/2019 1544   TRIG 148 10/27/2019 1544   HDL 76 10/27/2019 1544   CHOLHDL 2.7 10/27/2019 1544   VLDL 30 10/27/2019 1544   LDLCALC 101 (H) 10/27/2019 1544     Risk Assessment/Calculations:      Physical Exam:    VS:  BP 140/90 (BP Location: Left Arm, Patient Position: Sitting, Cuff Size: Normal)   Pulse 73   Ht 5\' 4"  (1.626 m)   Wt 139 lb (63 kg)   SpO2 95%   BMI 23.86 kg/m     Wt Readings from Last 3 Encounters:  07/07/20 139 lb (63 kg)  06/01/20 136 lb (61.7 kg)  01/19/20 116 lb (52.6 kg)     GEN:  Well nourished, well developed in no acute distress HEENT: Normal NECK: No JVD; No carotid bruits LYMPHATICS: No  lymphadenopathy CARDIAC: RRR, no murmurs, rubs, gallops RESPIRATORY:  Clear to auscultation without rales, wheezing or rhonchi  ABDOMEN: Soft, non-tender, non-distended MUSCULOSKELETAL:  No edema; No deformity  SKIN: Warm and dry NEUROLOGIC:  Alert and oriented x 3 PSYCHIATRIC:  Normal affect   ASSESSMENT:    1. Hypertension, unspecified type   2. Chronic congestive heart failure, unspecified heart failure type (HCC)    PLAN:    In order of problems listed above:  Chronic systolic heart failure - Continuing with Entresto, carvedilol.  Also on amlodipine for her blood pressure as well.  Blood pressure today 140/90.  Improved.  Difficult to control hypertension - Now on Entresto this is helped.  Abnormal EKG - T wave  inversion V5 through V6.  Chronic kidney disease stage III - Continue to monitor creatinine.  Status post endovascular primary coiling of left posterior communicating artery aneurysm with near complete obliteration.  July 2021  39-month follow-up.       Medication Adjustments/Labs and Tests Ordered: Current medicines are reviewed at length with the patient today.  Concerns regarding medicines are outlined above.  No orders of the defined types were placed in this encounter.  Meds ordered this encounter  Medications  . carvedilol (COREG) 25 MG tablet    Sig: Take 1 tablet (25 mg total) by mouth 2 (two) times daily.    Dispense:  180 tablet    Refill:  3    Dose increase. Please d/c rx for 12.5mg   . sacubitril-valsartan (ENTRESTO) 97-103 MG    Sig: Take 1 tablet by mouth 2 (two) times daily.    Dispense:  180 tablet    Refill:  3    Please discontinue rx for Entresto 49/51 this is dose increase    Patient Instructions  Medication Instructions:  No changes. *If you need a refill on your cardiac medications before your next appointment, please call your pharmacy*   Lab Work: none If you have labs (blood work) drawn today and your tests are  completely normal, you will receive your results only by: Marland Kitchen MyChart Message (if you have MyChart) OR . A paper copy in the mail If you have any lab test that is abnormal or we need to change your treatment, we will call you to review the results.   Testing/Procedures: none   Follow-Up: At [facility], you and your health needs are our priority.  As part of our continuing mission to provide you with exceptional heart care, we have created designated Provider Care Teams.  These Care Teams include your primary Cardiologist (physician) and Advanced Practice Providers (APPs -  Physician Assistants and Nurse Practitioners) who all work together to provide you with the care you need, when you need it.  We recommend signing up for the patient portal called "MyChart".  Sign up information is provided on this After Visit Summary.  MyChart is used to connect with patients for Virtual Visits (Telemedicine).  Patients are able to view lab/test results, encounter notes, upcoming appointments, etc.  Non-urgent messages can be sent to your provider as well.   To learn more about what you can do with MyChart, go to [email or web address].    Your next appointment:   6 month(s)  The format for your next appointment:   In Person  Provider:   Donato Schultz, MD   Other Instructions none     Signed, Donato Schultz, MD  07/07/2020 10:47 AM    [facility]

## 2020-07-12 ENCOUNTER — Ambulatory Visit (HOSPITAL_COMMUNITY): Admission: RE | Admit: 2020-07-12 | Payer: No Typology Code available for payment source | Source: Ambulatory Visit

## 2020-07-12 ENCOUNTER — Encounter (HOSPITAL_COMMUNITY): Payer: Self-pay

## 2020-07-12 ENCOUNTER — Ambulatory Visit (HOSPITAL_COMMUNITY): Payer: No Typology Code available for payment source

## 2020-07-13 ENCOUNTER — Ambulatory Visit: Payer: No Typology Code available for payment source

## 2020-07-28 ENCOUNTER — Other Ambulatory Visit: Payer: Self-pay

## 2020-07-28 ENCOUNTER — Ambulatory Visit
Admission: RE | Admit: 2020-07-28 | Discharge: 2020-07-28 | Disposition: A | Discharge status: Home / Self Care | Payer: No Typology Code available for payment source | Source: Ambulatory Visit | Attending: Family Medicine | Admitting: Family Medicine

## 2020-07-28 DIAGNOSIS — Z1231 Encounter for screening mammogram for malignant neoplasm of breast: Secondary | ICD-10-CM

## 2020-07-28 IMAGING — MG MM DIGITAL SCREENING BILAT W/ TOMO AND CAD
8 series · 9 of 24 positions shown · non-contrast
Comparison: Previous exam(s).

CLINICAL DATA: Screening.

EXAM:
DIGITAL SCREENING BILATERAL MAMMOGRAM WITH TOMOSYNTHESIS AND CAD
TECHNIQUE: Bilateral screening digital craniocaudal and mediolateral oblique
mammograms were obtained. Bilateral screening digital breast
tomosynthesis was performed. The images were evaluated with
computer-aided detection.

[R CC synth-2D]
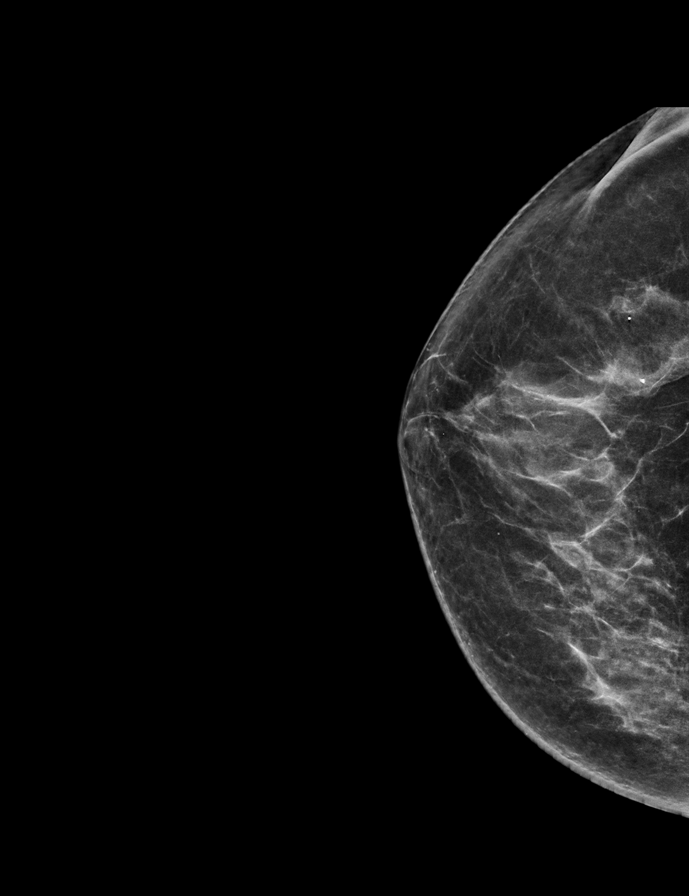

[L MLO synth-2D]
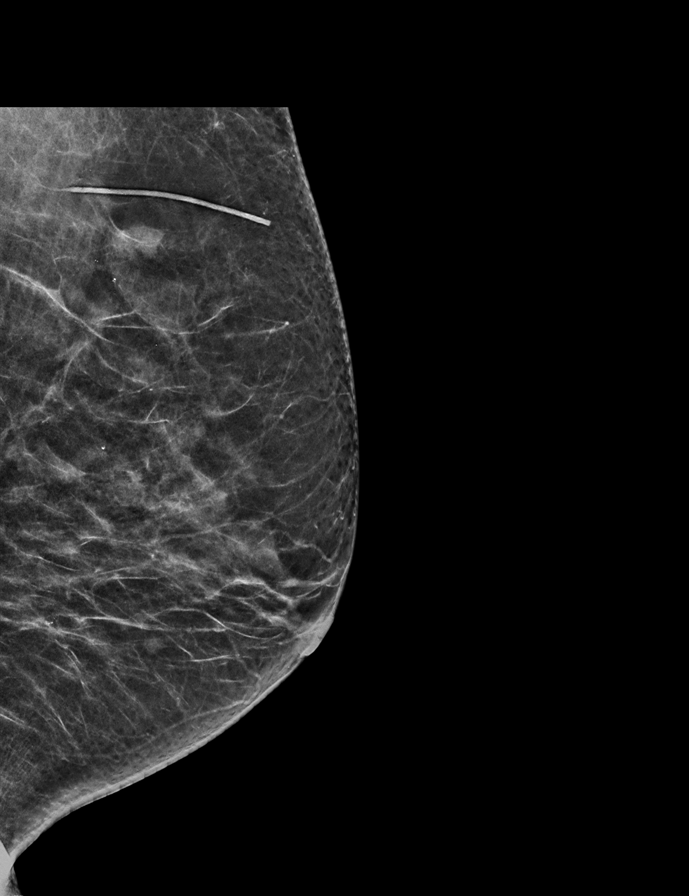

[L CC synth-2D]
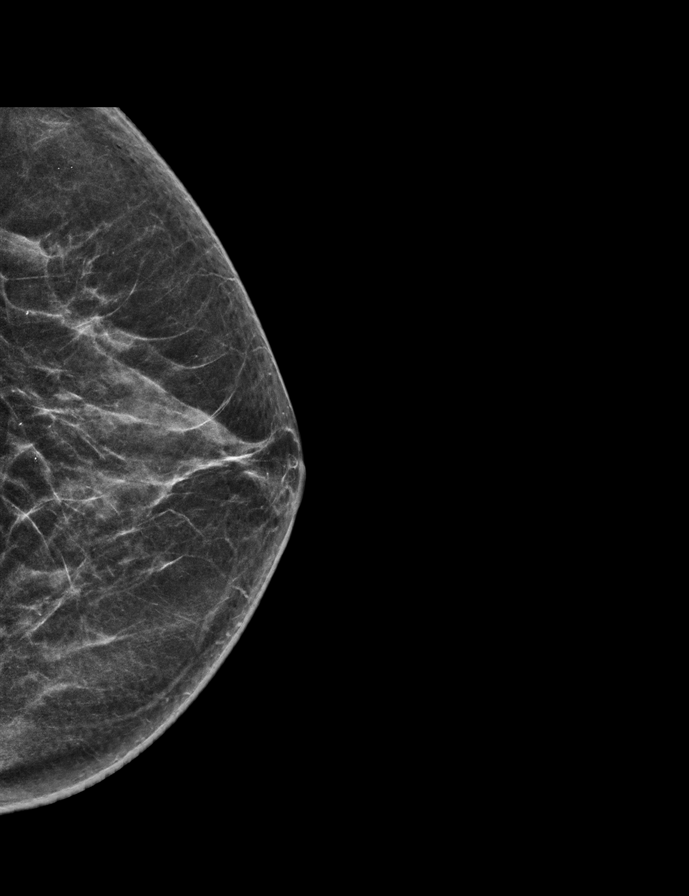

[R MLO synth-2D]
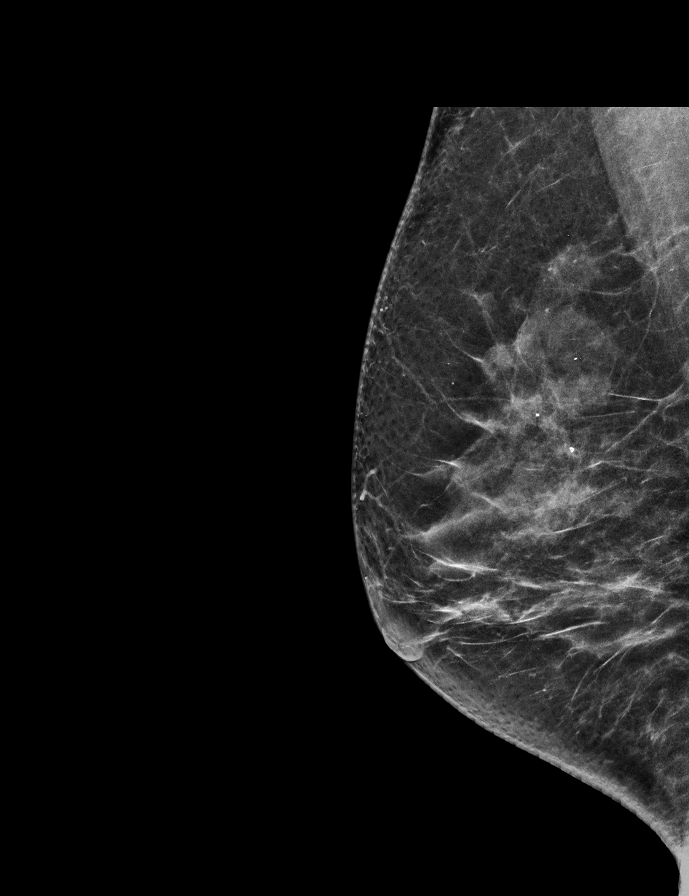

[R CC tomo · 2 of 66 frames shown]
[frame 22/66]
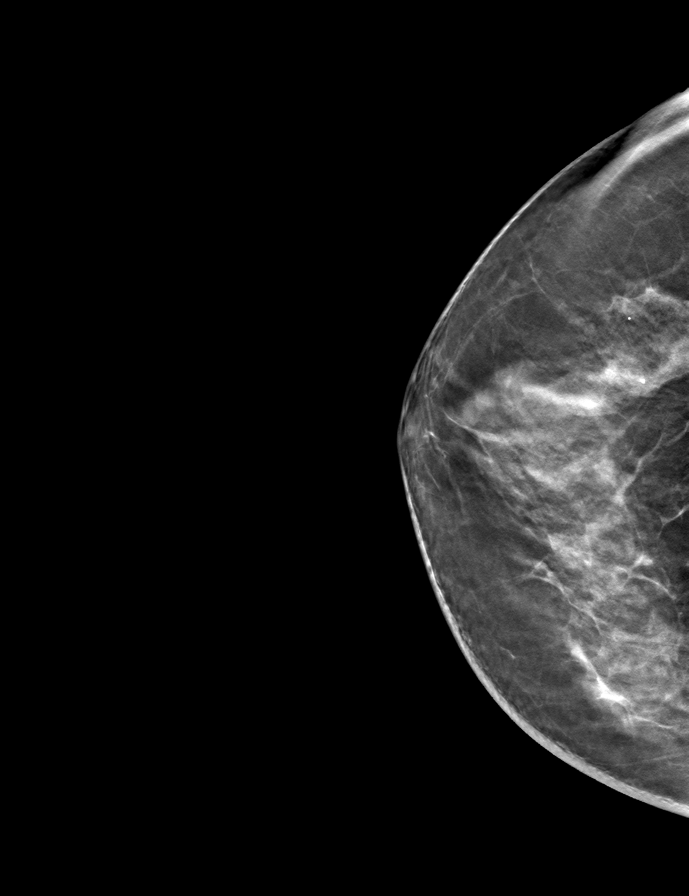
[frame 33/66]
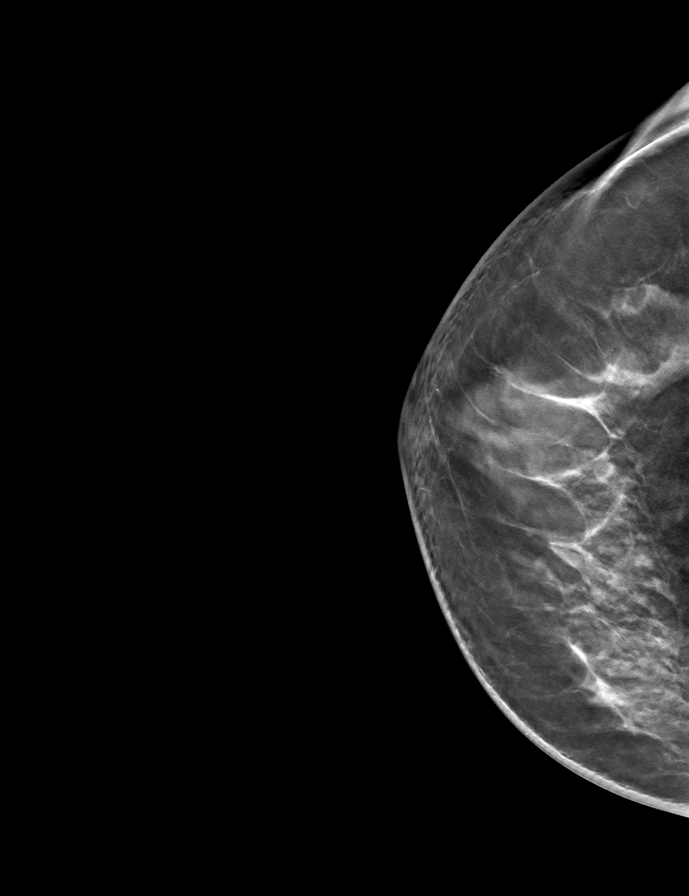

[R MLO tomo · tomo slice 33/65.0]
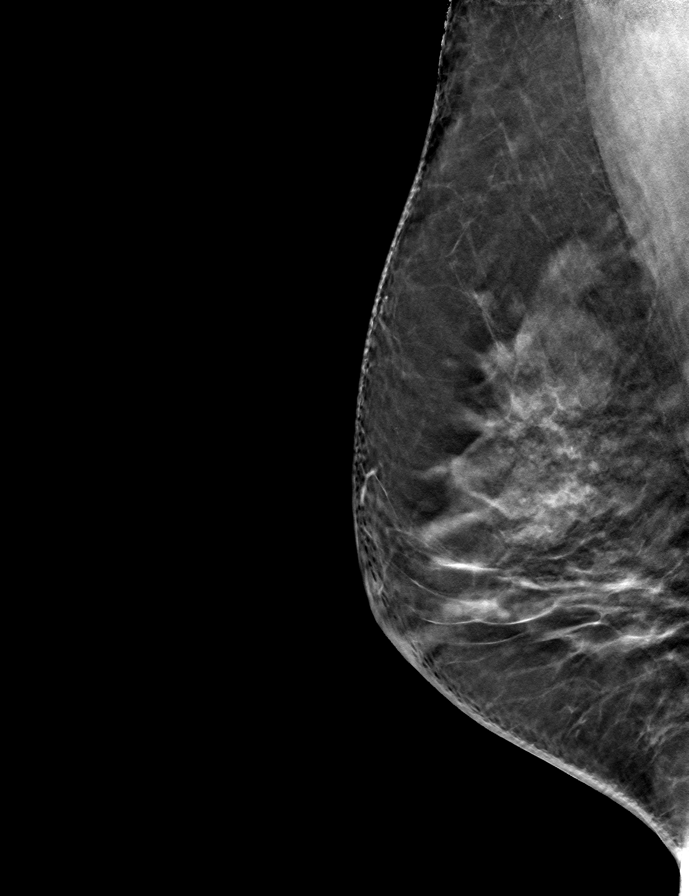

[L MLO tomo · tomo slice 30/59.0]
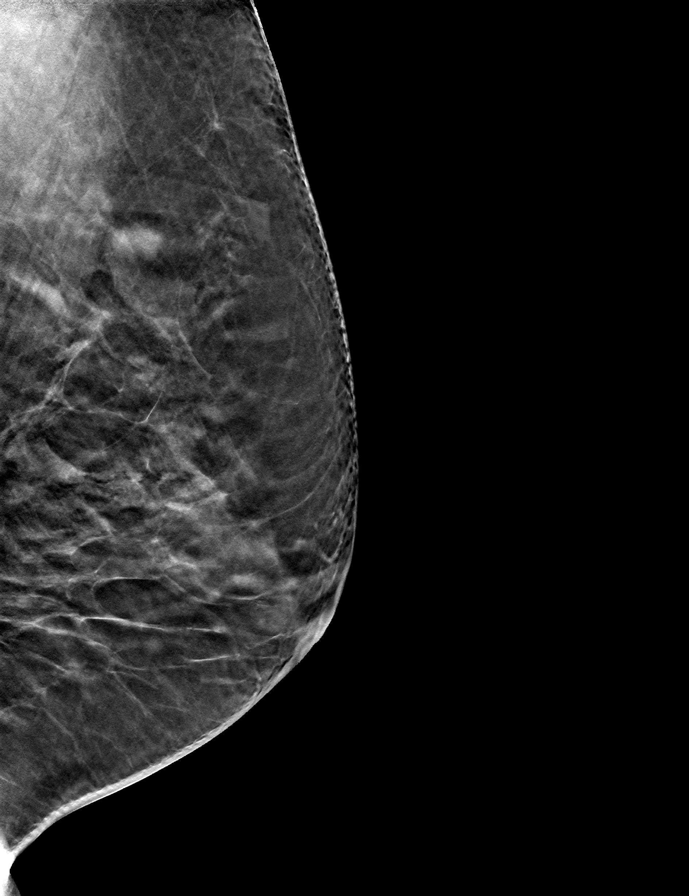

[L CC tomo · tomo slice 31/61.0]
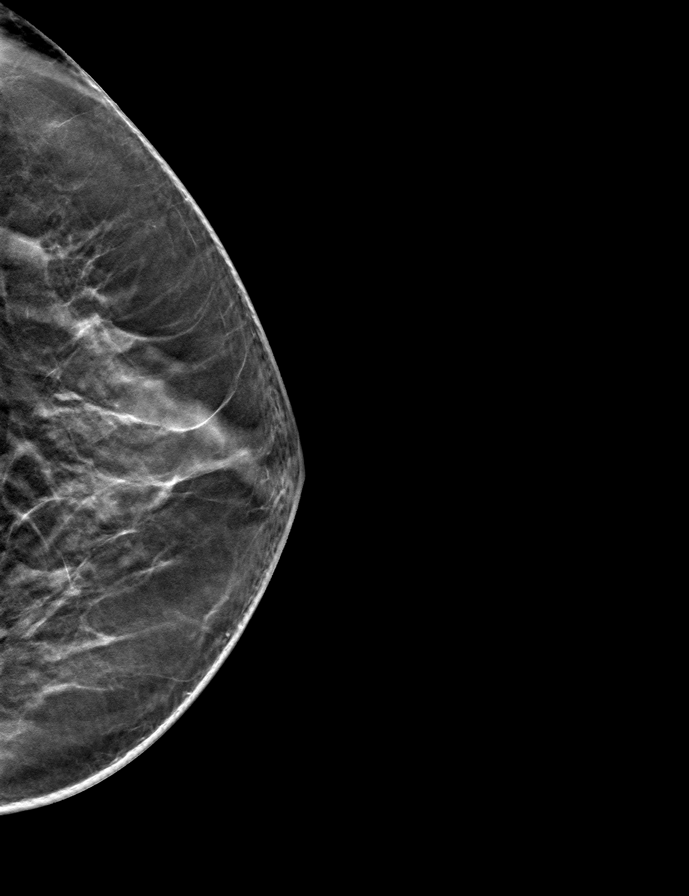

[9 of 24 positions shown; findings below may reference images not displayed]

ACR Breast Density Category b: There are scattered areas of
fibroglandular density.
FINDINGS: In the left breast, a possible asymmetry warrants further
evaluation. In the right breast, no findings suspicious for
malignancy. The images were evaluated with computer-aided detection.
IMPRESSION: Further evaluation is suggested for possible asymmetry in the left
breast.

RECOMMENDATION:
Diagnostic mammogram and possibly ultrasound of the left breast.
(Code:[ZR])

The patient will be contacted regarding the findings, and additional
imaging will be scheduled.

BI-RADS CATEGORY  0: Incomplete. Need additional imaging evaluation
and/or prior mammograms for comparison.

## 2020-08-01 ENCOUNTER — Other Ambulatory Visit: Payer: Self-pay | Admitting: Family Medicine

## 2020-08-01 DIAGNOSIS — N6489 Other specified disorders of breast: Secondary | ICD-10-CM

## 2020-08-02 ENCOUNTER — Other Ambulatory Visit: Payer: Self-pay

## 2020-08-02 ENCOUNTER — Ambulatory Visit (HOSPITAL_COMMUNITY)
Admission: RE | Admit: 2020-08-02 | Discharge: 2020-08-02 | Disposition: A | Discharge status: Home / Self Care | Payer: No Typology Code available for payment source | Source: Ambulatory Visit | Attending: Interventional Radiology | Admitting: Interventional Radiology

## 2020-08-02 DIAGNOSIS — I671 Cerebral aneurysm, nonruptured: Secondary | ICD-10-CM | POA: Insufficient documentation

## 2020-08-02 IMAGING — MR MR HEAD WO/W CM
9 of 12 series · 34 of 48 positions shown · IV contrast (Yes   gad)
Comparison: None.

CLINICAL DATA: Follow-up coiling of brain aneurysm

EXAM:
MRI HEAD WITHOUT CONTRAST
MRA HEAD WITHOUT CONTRAST
TECHNIQUE: Multiplanar, multiecho pulse sequences of the brain and surrounding
structures were obtained without intravenous contrast. Angiographic
images of the head were obtained using MRA technique without
contrast.

[Series 4: DWI · axial · 3.0mm · 1.09mm/px · z∈[-64,+91]mm · 9 of 108 slices shown (1 of 4)]
[im 1/108]
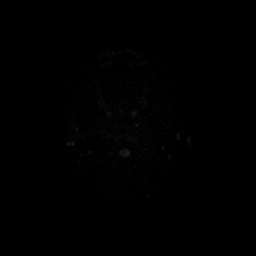
[im 14/108]
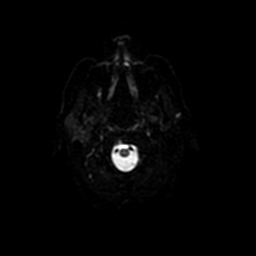
[im 27/108]
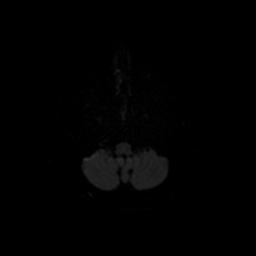
[im 41/108]
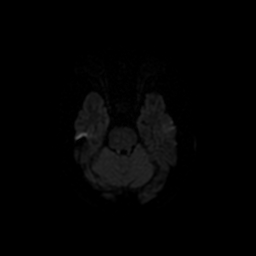
[im 54/108]
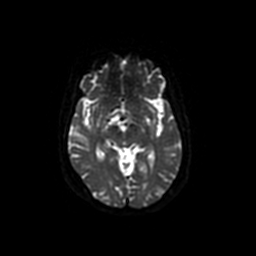
[im 67/108]
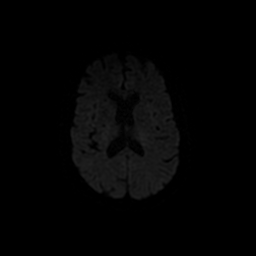
[im 81/108]
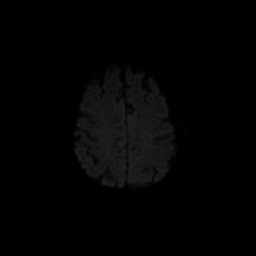
[im 94/108]
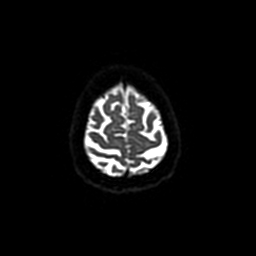
[im 108/108]
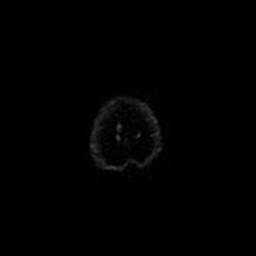

[Series 5: DWI · coronal · 5.0mm · 1.09mm/px · 6 of 70 slices shown (2 of 4)]
[im 1/70]
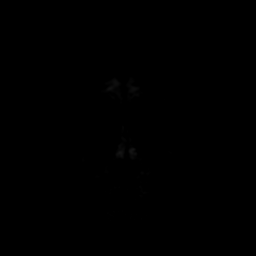
[im 14/70]
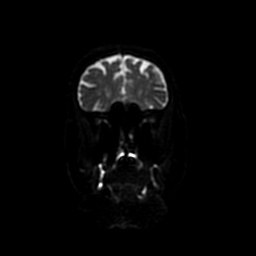
[im 28/70]
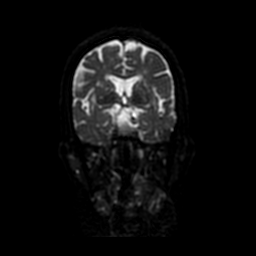
[im 42/70]
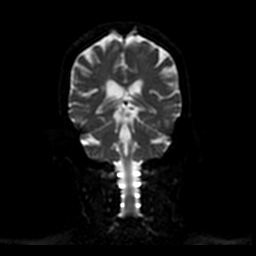
[im 56/70]
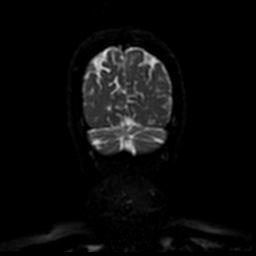
[im 70/70]
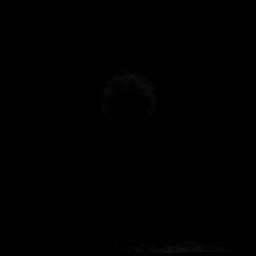

[Series 7: T2 · axial · 5.0mm · 0.43mm/px · z∈[-45,+97]mm · 2 of 25 slices shown]
[im 1/25]
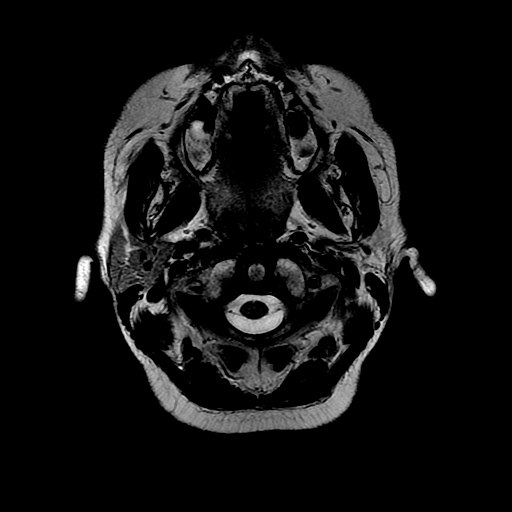
[im 25/25]
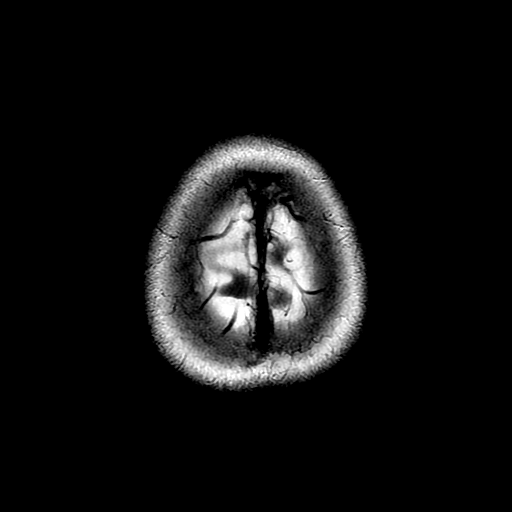

[Series 8: FLAIR · axial · 3.0mm · 0.43mm/px · z∈[-45,+97]mm · 2 of 25 slices shown]
[im 1/25]
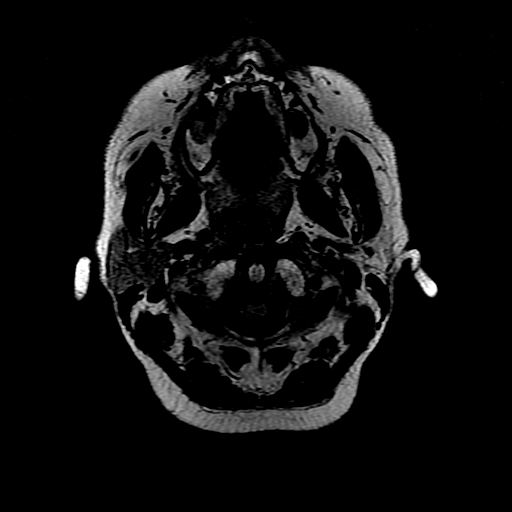
[im 25/25]
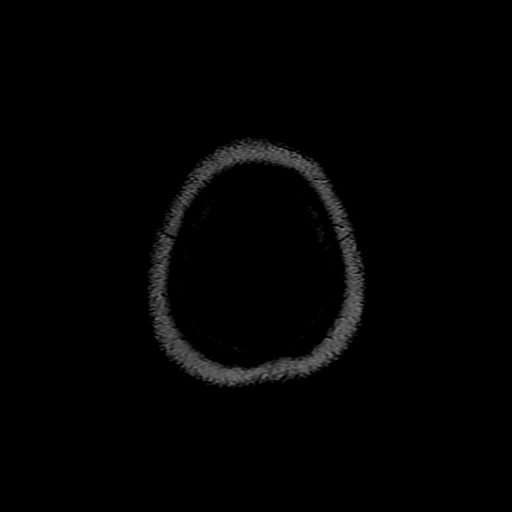

[Series 11: T2 post-contrast · coronal · 5.0mm · 0.39mm/px · 1 of 25 slices shown]
[im 1/25]
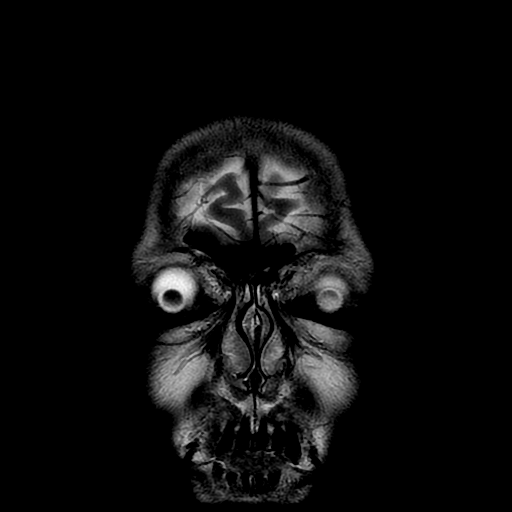

[Series 12: T1 post-contrast · axial · 3.0mm · 0.47mm/px · z∈[-49,+96]mm · 4 of 50 slices shown (1 of 2)]
[im 1/50]
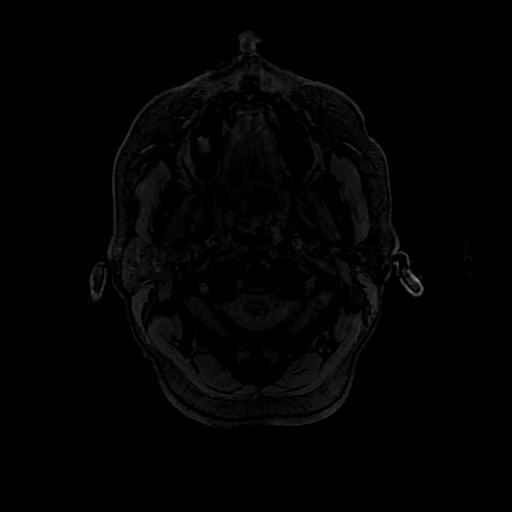
[im 17/50]
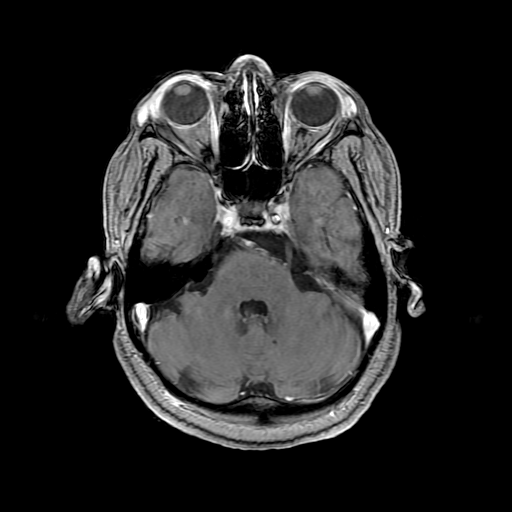
[im 33/50]
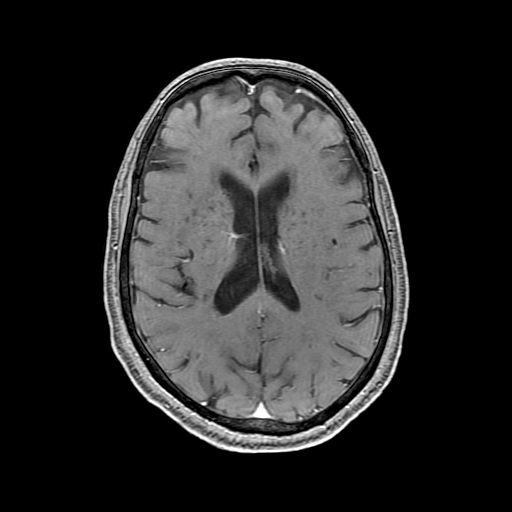
[im 50/50]
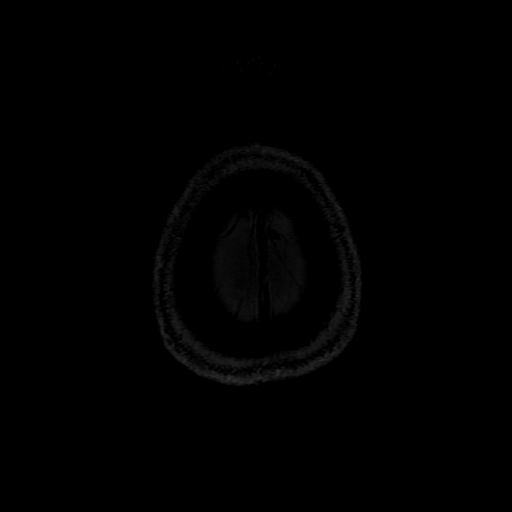

[Series 13: T1 post-contrast · coronal · 5.0mm · 0.39mm/px · 2 of 25 slices shown (2 of 2)]
[im 1/25]
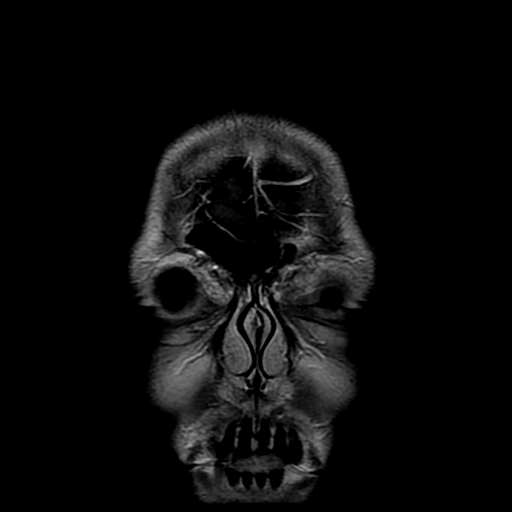
[im 25/25]
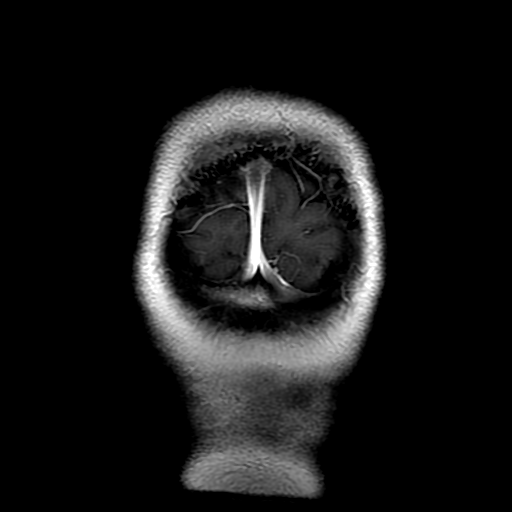

[Series 400: DWI · axial · 3.0mm · 1.09mm/px · z∈[-64,+91]mm · 5 of 54 slices shown (3 of 4)]
[im 1/54]
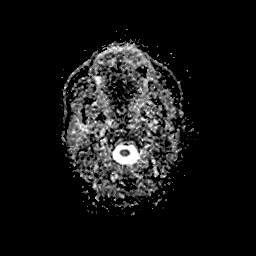
[im 14/54]
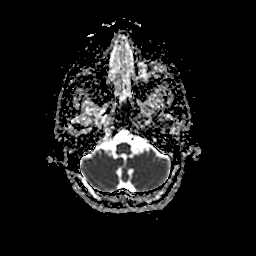
[im 27/54]
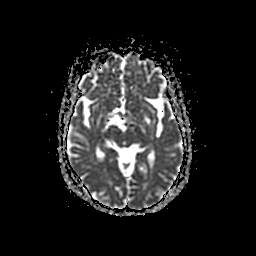
[im 40/54]
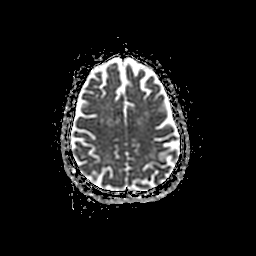
[im 54/54]
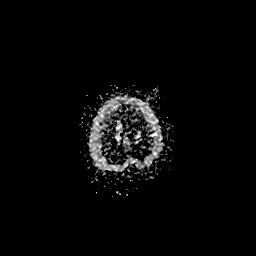

[Series 500: DWI · coronal · 5.0mm · 1.09mm/px · 3 of 34 slices shown (4 of 4)]
[im 1/34]
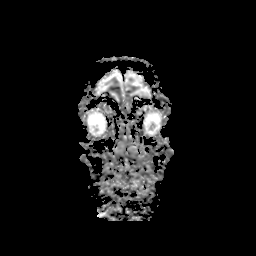
[im 17/34]
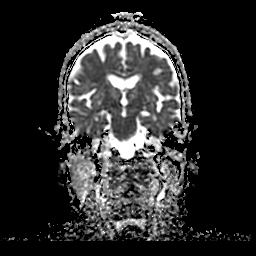
[im 34/34]
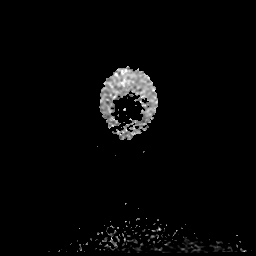

[34 of 48 positions shown; findings below may reference images not displayed]

FINDINGS: MRI HEAD FINDINGS

Brain: No acute infarct, mass effect or extra-axial collection.
There are 4 scattered foci of chronic microhemorrhage scattered
throughout the brain. There is multifocal hyperintense T2-weighted
signal within the white matter. Generalized volume loss without a
clear lobar predilection. The midline structures are normal. There
is no abnormal contrast enhancement.

Vascular: Major flow voids are preserved.

Skull and upper cervical spine: Normal calvarium and skull base.
Visualized upper cervical spine and soft tissues are normal.

Sinuses/Orbits:No paranasal sinus fluid levels or advanced mucosal
thickening. No mastoid or middle ear effusion. Normal orbits.

MRA HEAD FINDINGS

POSTERIOR CIRCULATION:

--Vertebral arteries: Normal

--Inferior cerebellar arteries: Normal.

--Basilar artery: Normal.

--Superior cerebellar arteries: Normal.

--Posterior cerebral arteries: Left P1 segment is markedly narrowed,
but there is a fetal predominant origin from the posterior
communicating artery. Normal right PCA.

ANTERIOR CIRCULATION:

--Intracranial internal carotid arteries: Coiled aneurysm of the
left ICA communicating segment without residual filling. The left
P-comm remains patent.

--Anterior cerebral arteries (ACA): Normal.

--Middle cerebral arteries (MCA): Normal.

ANATOMIC VARIANTS: Fetal origin of the left PCA.
IMPRESSION: 1. No acute intracranial abnormality.
2. Coiled aneurysm of the left ICA communicating segment without
residual filling.
3. Multifocal chronic ischemic microangiopathy of the white matter.

## 2020-08-02 IMAGING — MR MR MRA HEAD W/O CM
2 series · 19 of 48 positions shown · non-contrast
Comparison: None.

CLINICAL DATA: Follow-up coiling of brain aneurysm

EXAM:
MRI HEAD WITHOUT CONTRAST
MRA HEAD WITHOUT CONTRAST
TECHNIQUE: Multiplanar, multiecho pulse sequences of the brain and surrounding
structures were obtained without intravenous contrast. Angiographic
images of the head were obtained using MRA technique without
contrast.

[Series 3: (id) mt fs · axial · 1.4mm · 0.43mm/px · z∈[-52,+34]mm · 18 of 136 slices shown]
[im 1/136]
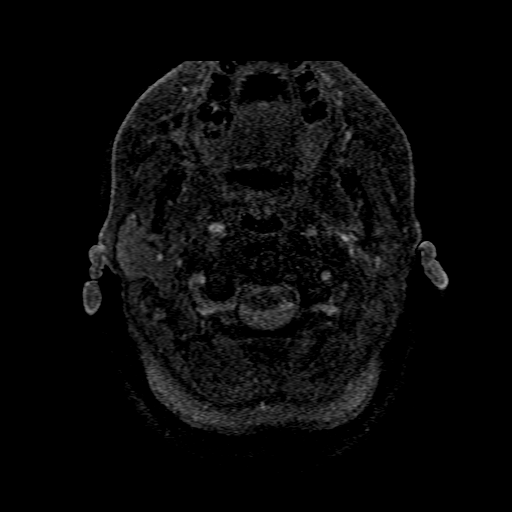
[im 3/136]
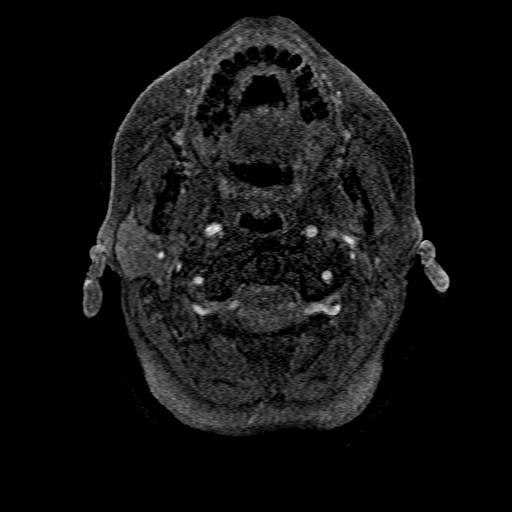
[im 6/136]
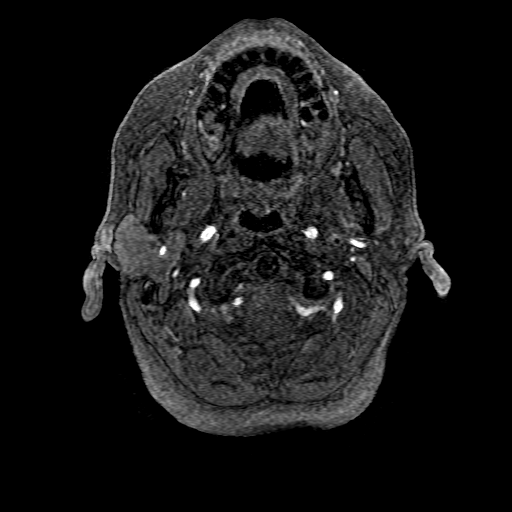
[im 9/136]
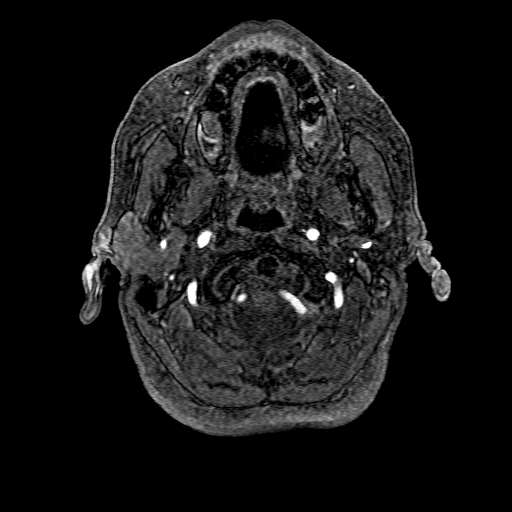
[im 12/136]
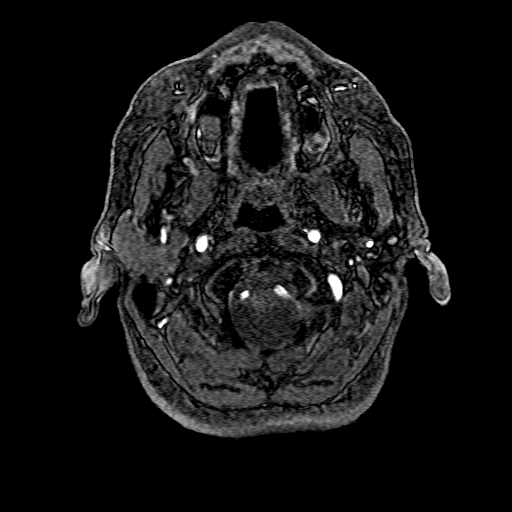
[im 15/136]
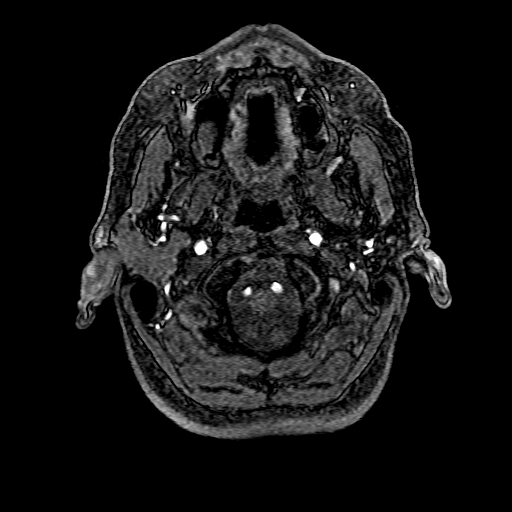
[im 18/136]
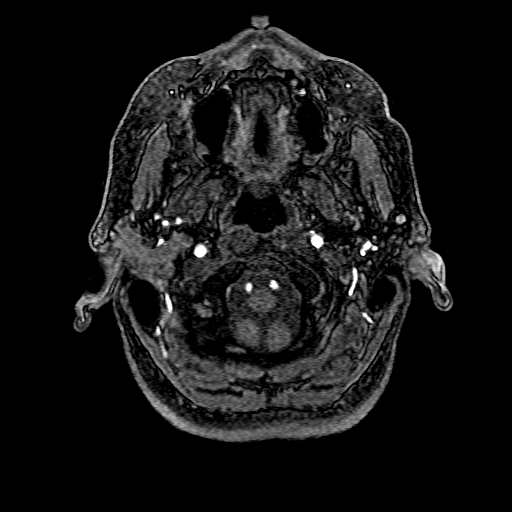
[im 21/136]
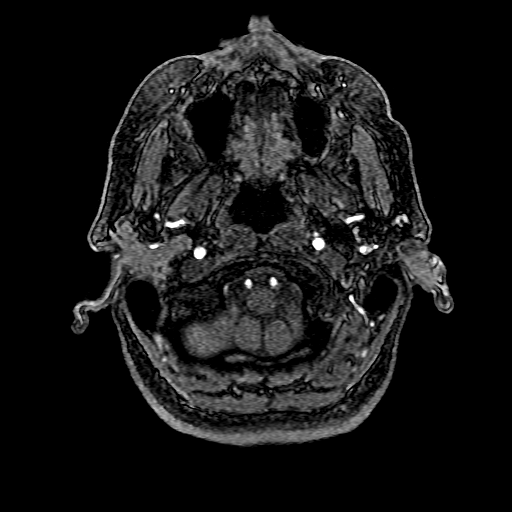
[im 24/136]
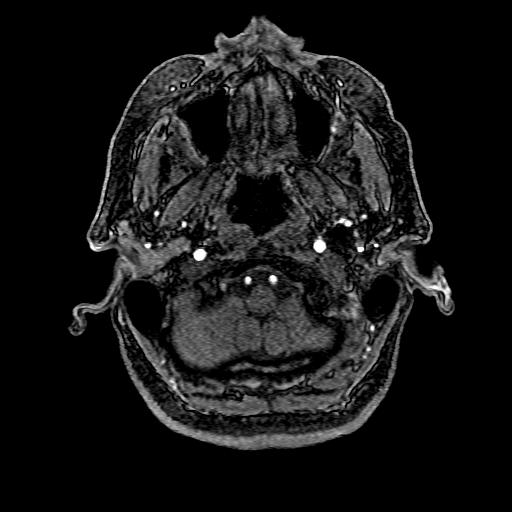
[im 27/136]
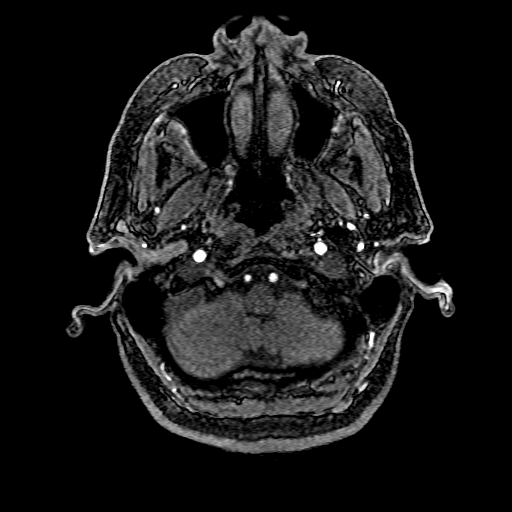
[im 42/136]
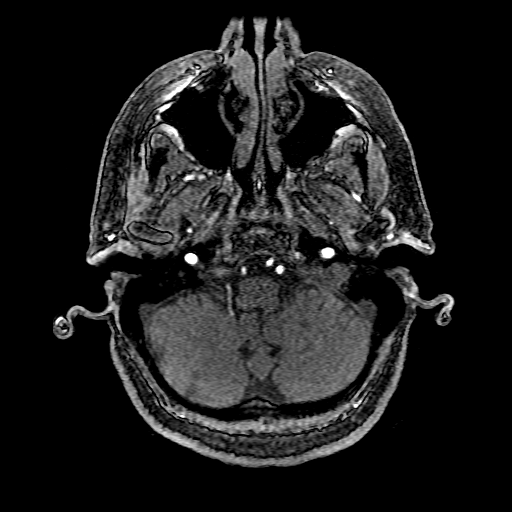
[im 59/136]
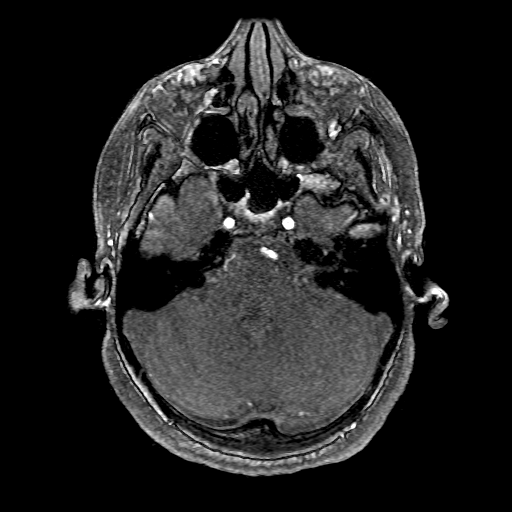
[im 68/136]
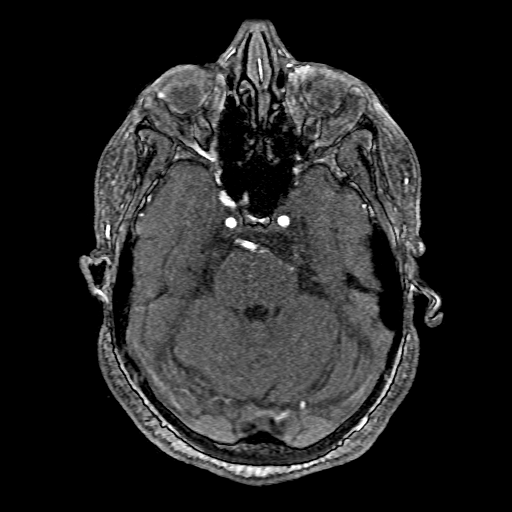
[im 77/136]
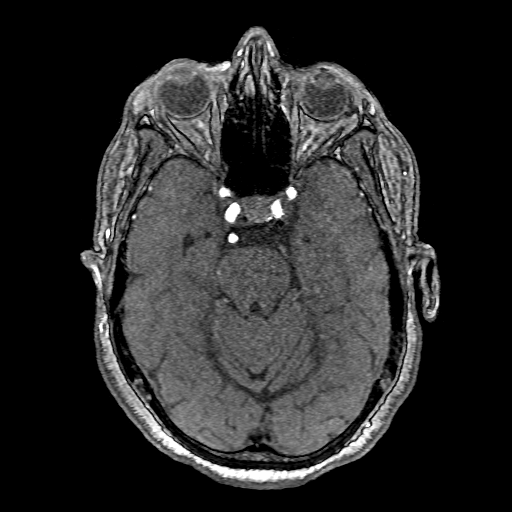
[im 94/136]
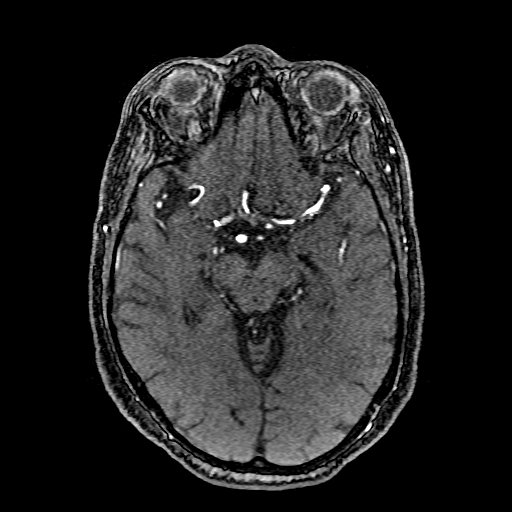
[im 112/136]
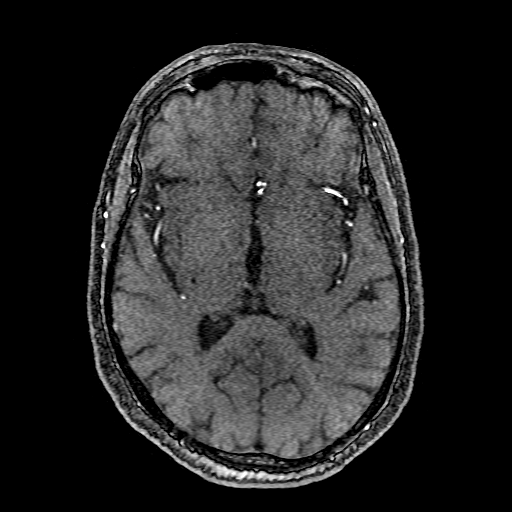
[im 115/136]
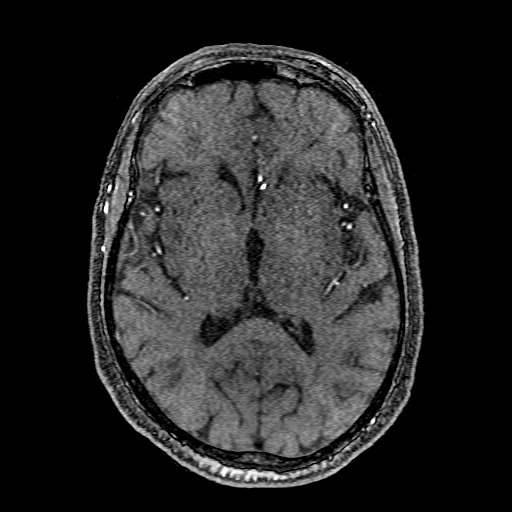
[im 130/136]
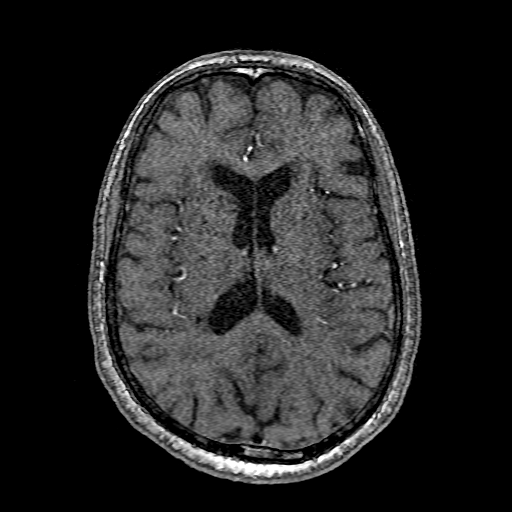

[Series 303: processed images · axial · 1.4mm · 0.43mm/px · 1 of 1 slices shown]
[im 1/1]
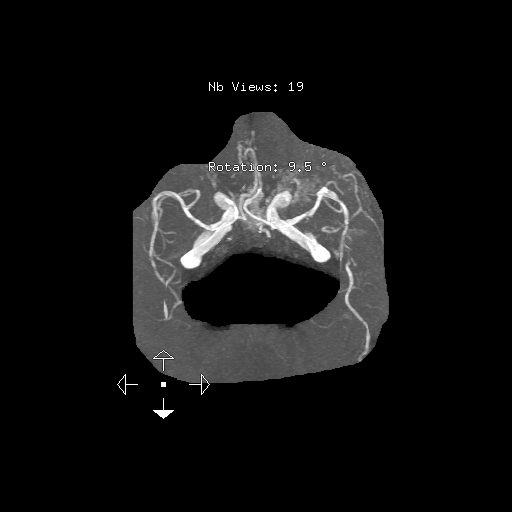

[19 of 48 positions shown; findings below may reference images not displayed]

FINDINGS: MRI HEAD FINDINGS

Brain: No acute infarct, mass effect or extra-axial collection.
There are 4 scattered foci of chronic microhemorrhage scattered
throughout the brain. There is multifocal hyperintense T2-weighted
signal within the white matter. Generalized volume loss without a
clear lobar predilection. The midline structures are normal. There
is no abnormal contrast enhancement.

Vascular: Major flow voids are preserved.

Skull and upper cervical spine: Normal calvarium and skull base.
Visualized upper cervical spine and soft tissues are normal.

Sinuses/Orbits:No paranasal sinus fluid levels or advanced mucosal
thickening. No mastoid or middle ear effusion. Normal orbits.

MRA HEAD FINDINGS

POSTERIOR CIRCULATION:

--Vertebral arteries: Normal

--Inferior cerebellar arteries: Normal.

--Basilar artery: Normal.

--Superior cerebellar arteries: Normal.

--Posterior cerebral arteries: Left P1 segment is markedly narrowed,
but there is a fetal predominant origin from the posterior
communicating artery. Normal right PCA.

ANTERIOR CIRCULATION:

--Intracranial internal carotid arteries: Coiled aneurysm of the
left ICA communicating segment without residual filling. The left
P-comm remains patent.

--Anterior cerebral arteries (ACA): Normal.

--Middle cerebral arteries (MCA): Normal.

ANATOMIC VARIANTS: Fetal origin of the left PCA.
IMPRESSION: 1. No acute intracranial abnormality.
2. Coiled aneurysm of the left ICA communicating segment without
residual filling.
3. Multifocal chronic ischemic microangiopathy of the white matter.

## 2020-08-02 MED ORDER — GADOBUTROL 1 MMOL/ML IV SOLN
5.0000 mL | Freq: Once | INTRAVENOUS | Status: AC | PRN
  Administered 2020-08-02: 5 mL via INTRAVENOUS

## 2020-08-05 ENCOUNTER — Other Ambulatory Visit: Payer: Self-pay

## 2020-08-05 ENCOUNTER — Ambulatory Visit
Admission: RE | Admit: 2020-08-05 | Discharge: 2020-08-05 | Disposition: A | Discharge status: Home / Self Care | Payer: No Typology Code available for payment source | Source: Ambulatory Visit | Attending: Family Medicine | Admitting: Family Medicine

## 2020-08-05 ENCOUNTER — Other Ambulatory Visit: Payer: Self-pay | Admitting: Family Medicine

## 2020-08-05 DIAGNOSIS — R928 Other abnormal and inconclusive findings on diagnostic imaging of breast: Secondary | ICD-10-CM

## 2020-08-05 DIAGNOSIS — N6489 Other specified disorders of breast: Secondary | ICD-10-CM

## 2020-08-05 IMAGING — MG MM DIGITAL DIAGNOSTIC UNILAT*L* W/ TOMO W/ CAD
6 series · 6 of 18 positions shown · non-contrast
Comparison: Previous exams including recent screening mammogram
dated [DATE].

CLINICAL DATA: Patient returns today to evaluate a possible LEFT
breast asymmetry questioned on recent screening mammogram.

EXAM:
DIGITAL DIAGNOSTIC UNILATERAL LEFT MAMMOGRAM WITH TOMOSYNTHESIS AND
CAD;
ULTRASOUND LEFT BREAST LIMITED
TECHNIQUE: Left digital diagnostic mammography and breast tomosynthesis was
performed. The images were evaluated with computer-aided detection.;
Targeted ultrasound examination of the left breast was performed.

[L XCCL synth-2D]
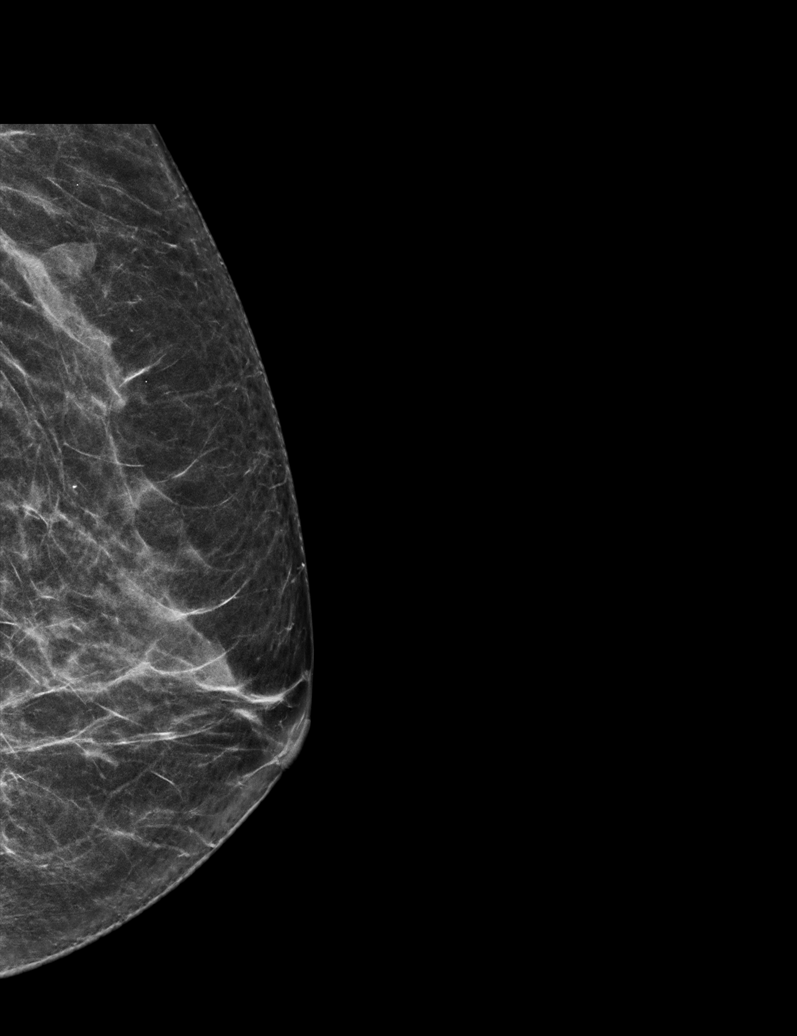

[L MLO synth-2D]
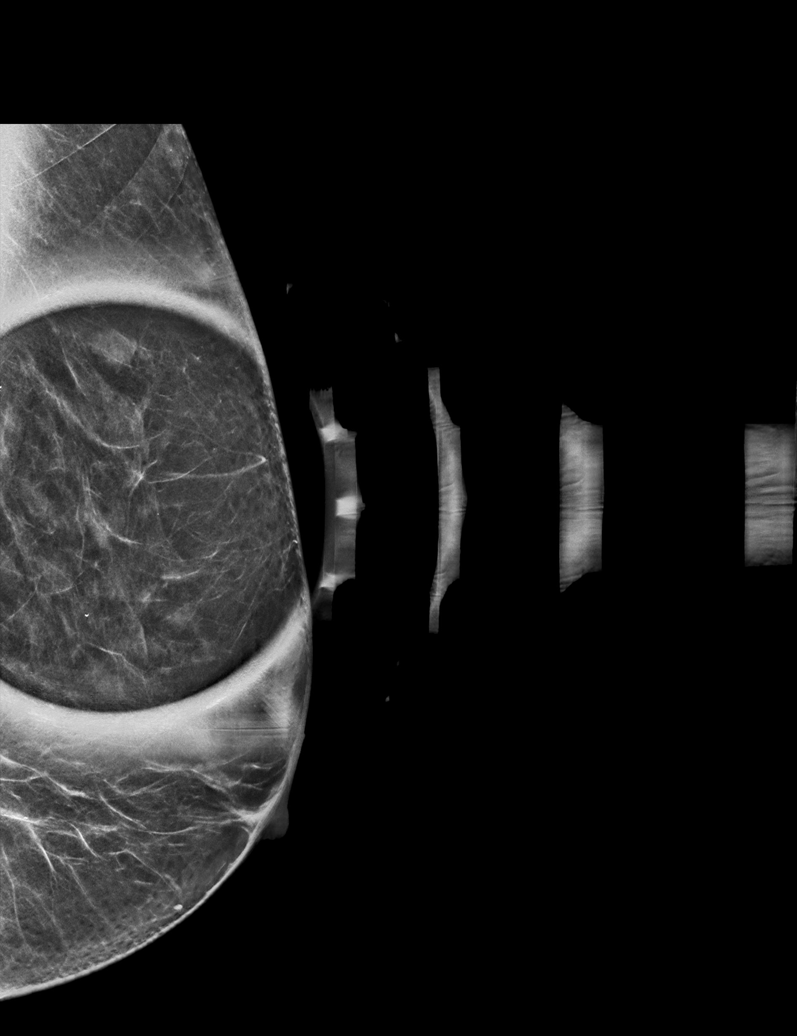

[L ML synth-2D]
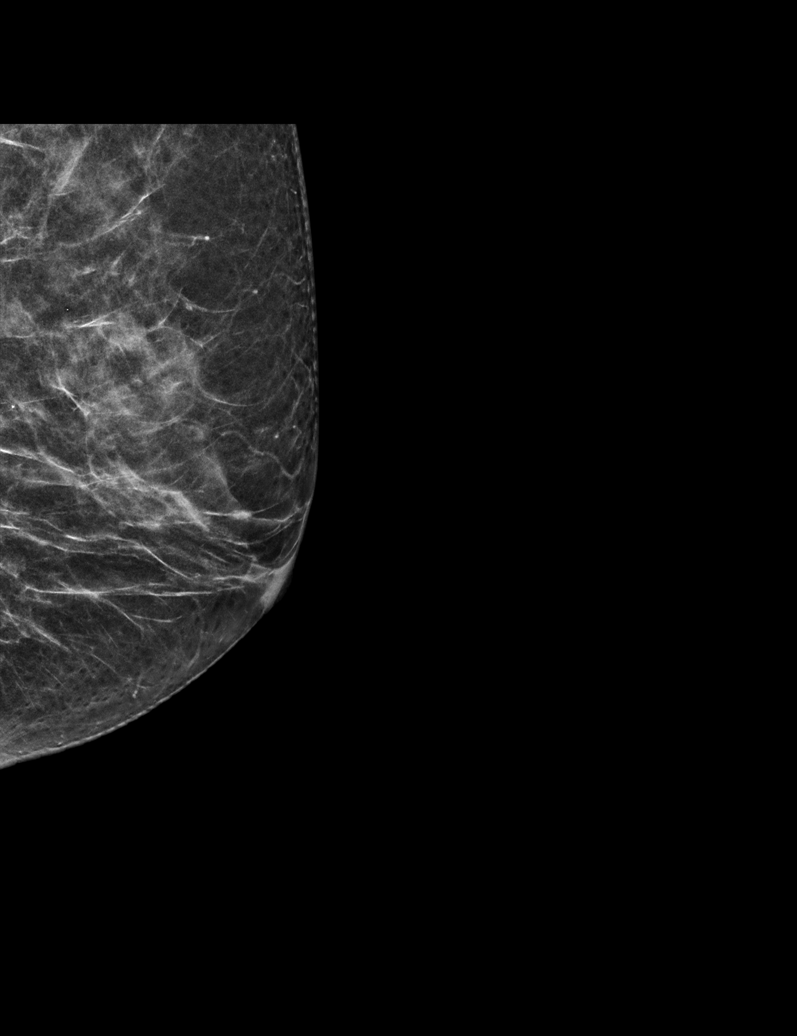

[L ML tomo · tomo slice 25/50.0]
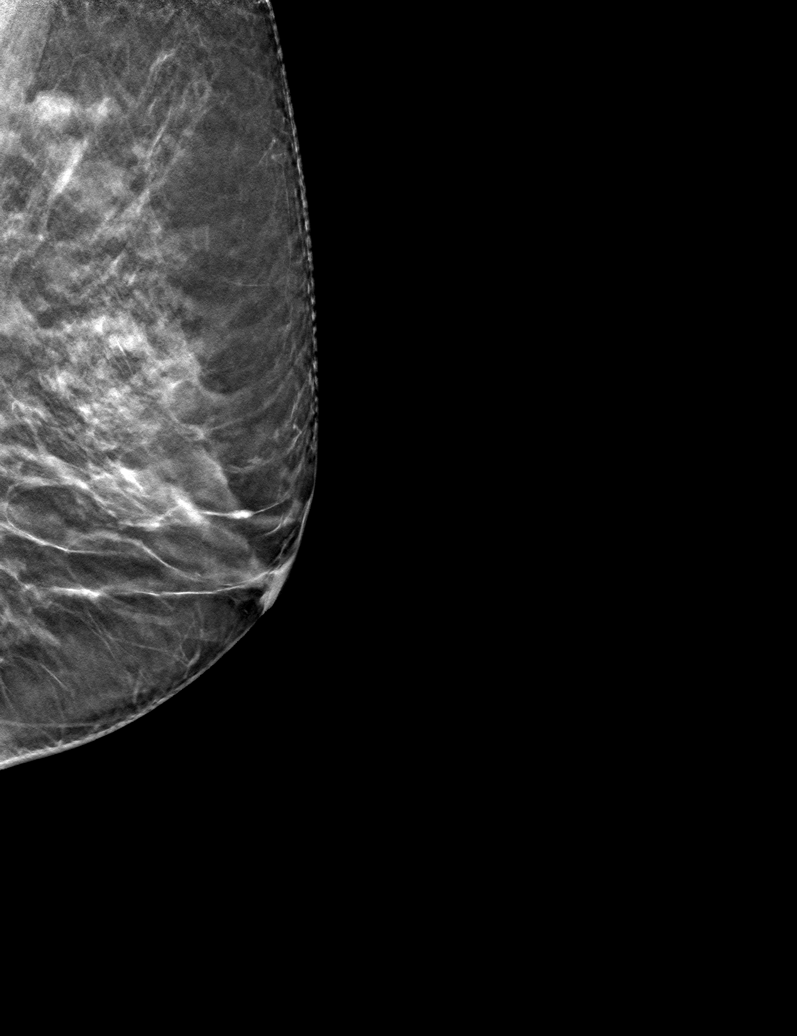

[L MLO tomo · tomo slice 31/62.0]
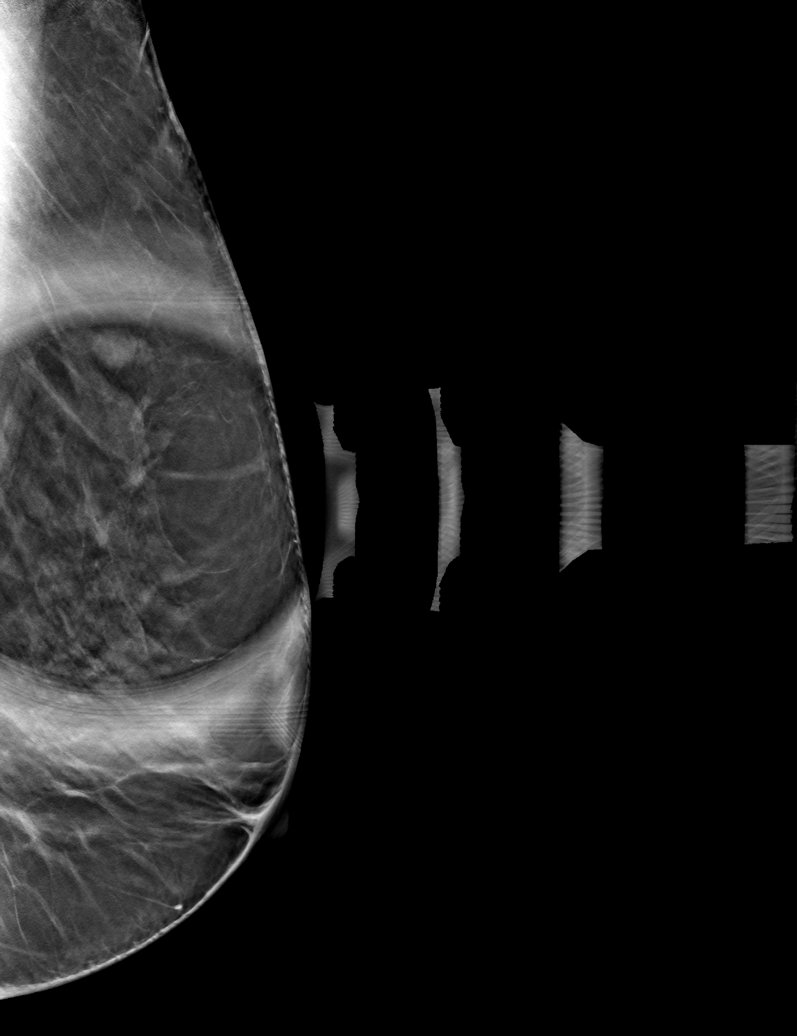

[L XCCL tomo · tomo slice 30/59.0]
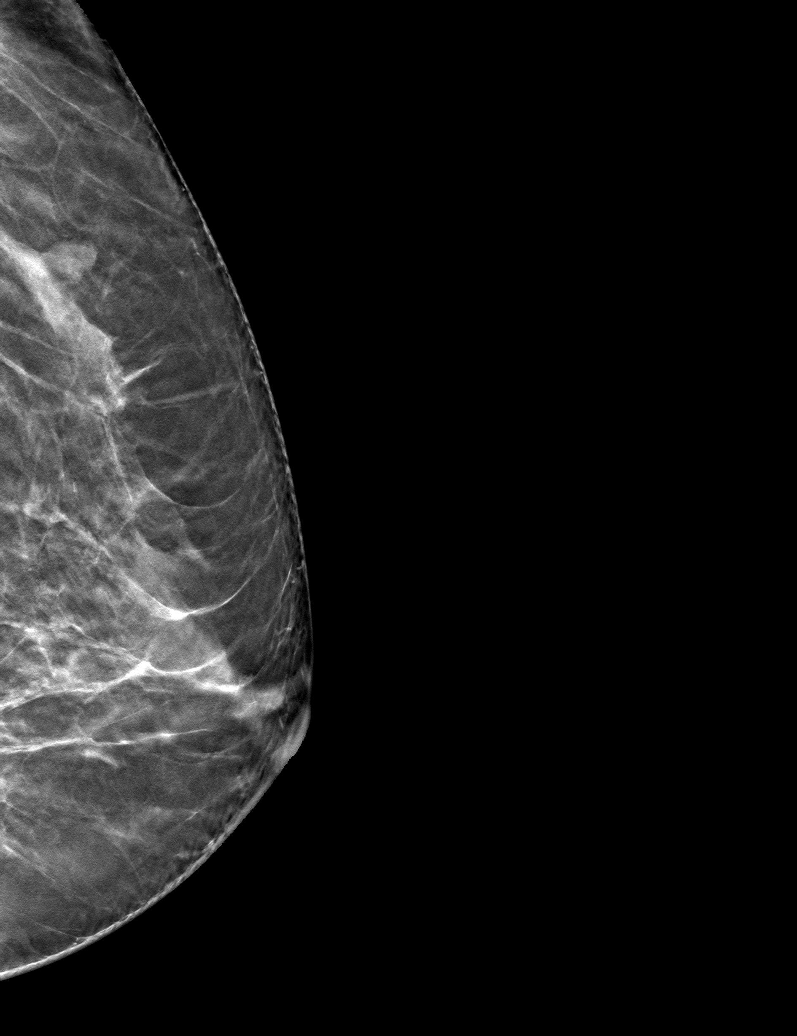

[6 of 18 positions shown; findings below may reference images not displayed]

ACR Breast Density Category b: There are scattered areas of
fibroglandular density.
FINDINGS: A partially obscured mass is confirmed within the upper-outer
quadrant of the LEFT breast, at posterior depth, measuring
approximately 9 mm greatest dimension.

Targeted ultrasound is performed, showing an oval hypoechoic mass in
the LEFT breast at the 2 o'clock axis, 8 cm from the nipple, with
partially indistinct margins, without internal vascularity,
measuring 7 x 3 x 7 mm, a likely correlate for the mammographic
finding.

LEFT axilla was evaluated with ultrasound showing no enlarged or
morphologically abnormal lymph nodes.
IMPRESSION: Hypoechoic mass in the LEFT breast at the 2 o'clock axis, 8 cm from
nipple, with partially indistinct margins, measuring 7 mm, a likely
correlate for the mammographic finding. This is a suspicious finding
for which ultrasound-guided core biopsy is recommended.

RECOMMENDATION:
1. Ultrasound-guided core biopsy of the LEFT breast mass at the 2
o'clock axis, 8 cm from the nipple, measuring 7 mm.
2. Postprocedure mammogram to ensure mammographic and sonographic
correspondence. If findings do not correspond, stereotactic biopsy
would be recommended.

Ultrasound-guided biopsy is scheduled for [DATE].

I have discussed the findings and recommendations with the patient.
If applicable, a reminder letter will be sent to the patient
regarding the next appointment.

BI-RADS CATEGORY  4: Suspicious.

## 2020-08-05 IMAGING — US US BREAST*L* LIMITED INC AXILLA
1 series · 9 of 9 positions shown · non-contrast
Comparison: Previous exams including recent screening mammogram
dated [DATE].

CLINICAL DATA: Patient returns today to evaluate a possible LEFT
breast asymmetry questioned on recent screening mammogram.

EXAM:
DIGITAL DIAGNOSTIC UNILATERAL LEFT MAMMOGRAM WITH TOMOSYNTHESIS AND
CAD;
ULTRASOUND LEFT BREAST LIMITED
TECHNIQUE: Left digital diagnostic mammography and breast tomosynthesis was
performed. The images were evaluated with computer-aided detection.;
Targeted ultrasound examination of the left breast was performed.

[Series 1: us breast*left* limited inc axilla · 0.06mm/px · 9 of 9 slices shown]
[im 1/9]
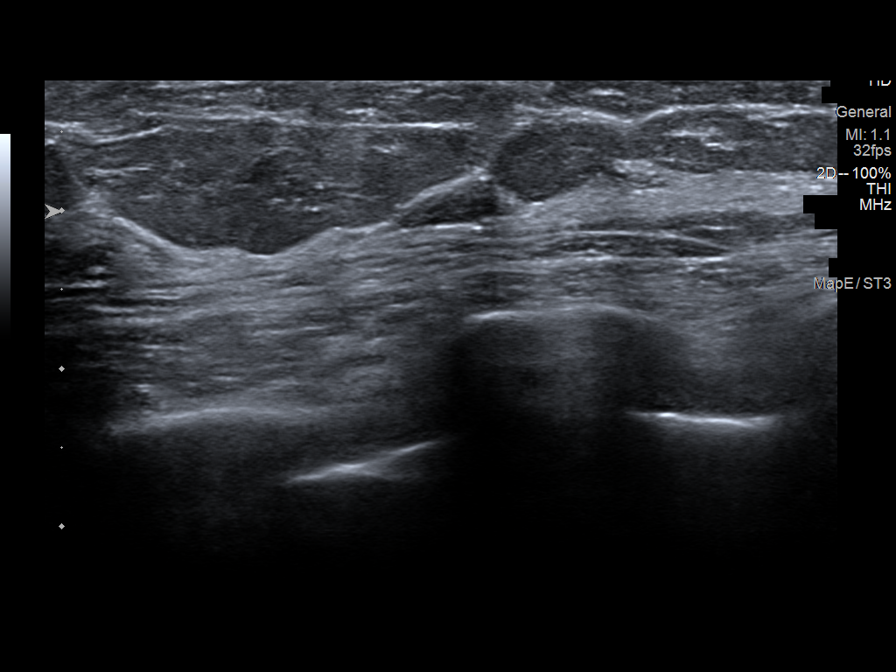
[im 2/9]
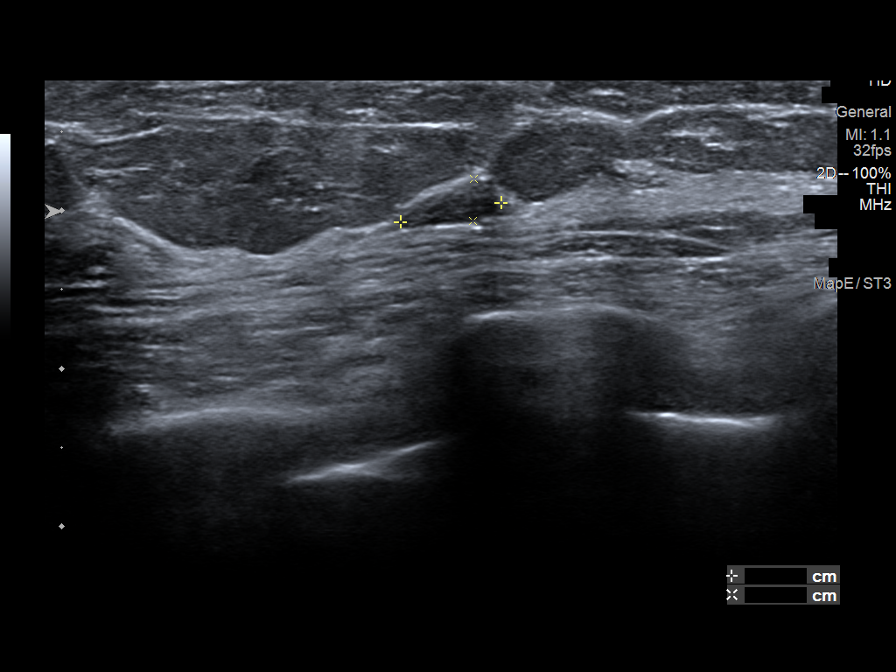
[im 3/9]
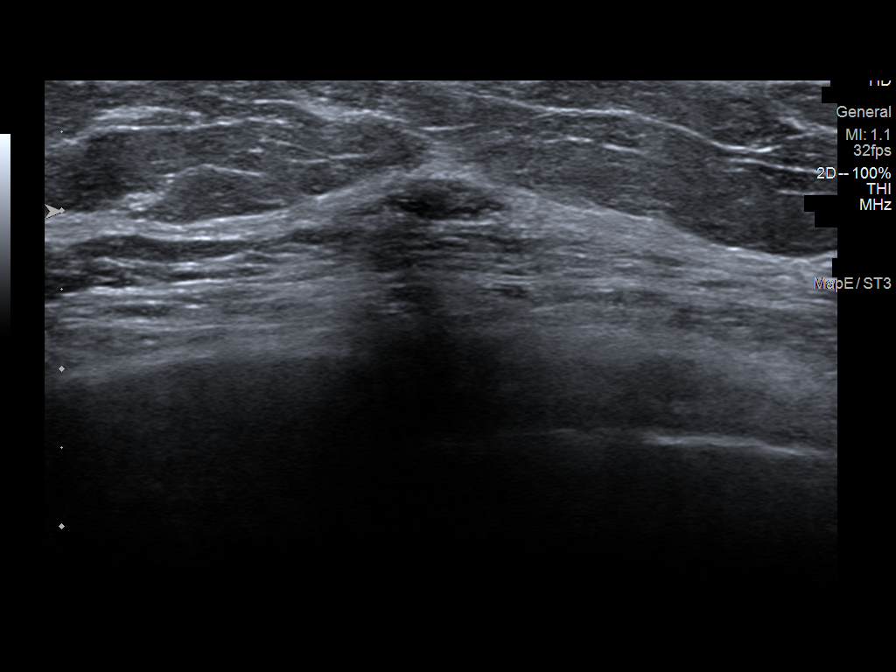
[im 4/9]
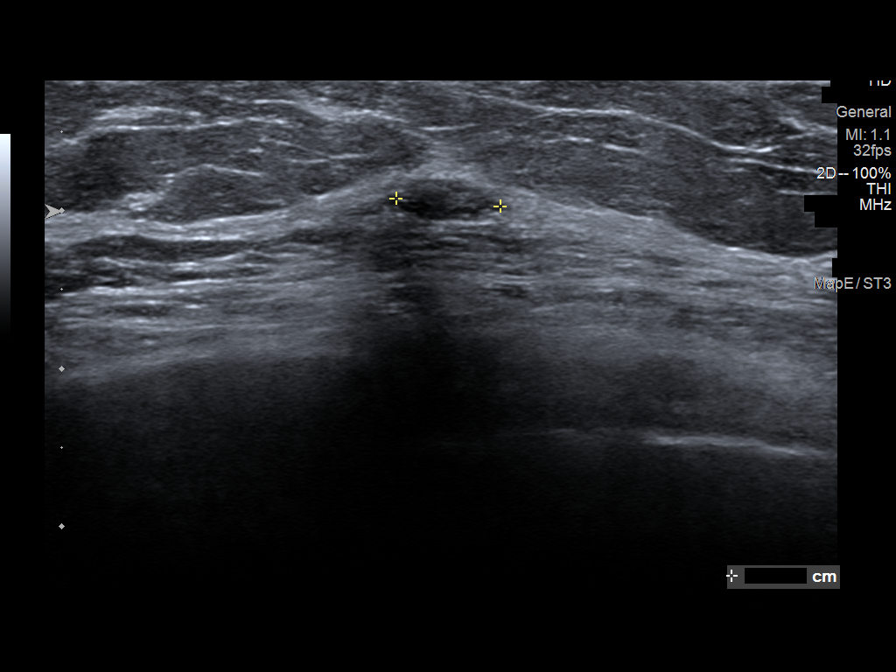
[im 5/9]
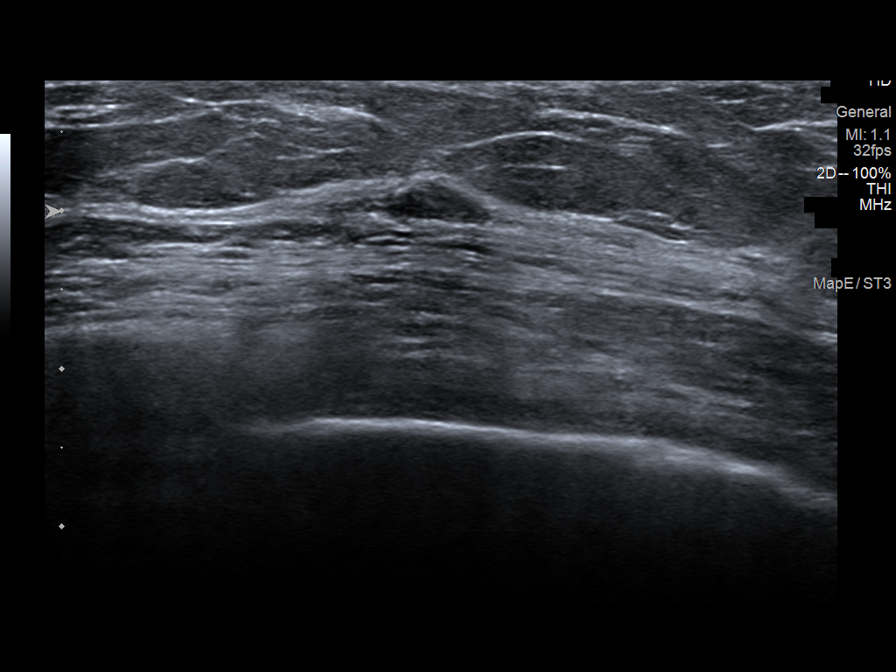
[im 6/9]
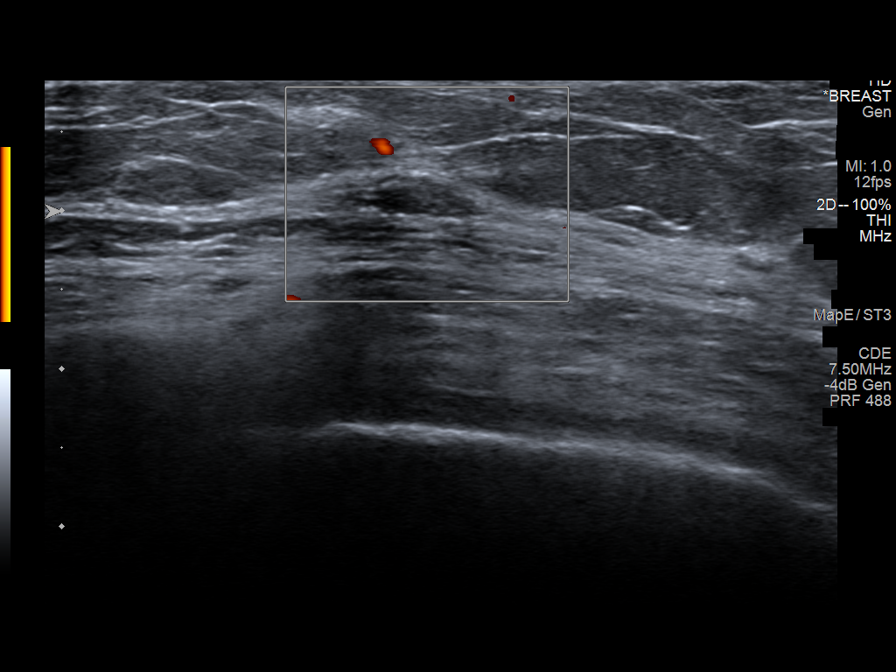
[im 7/9]
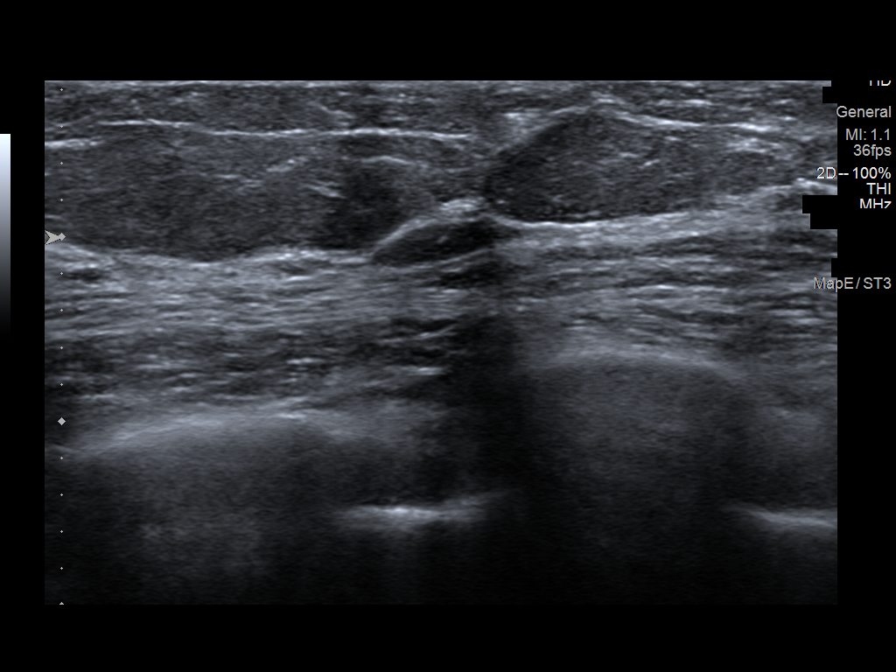
[im 8/9]
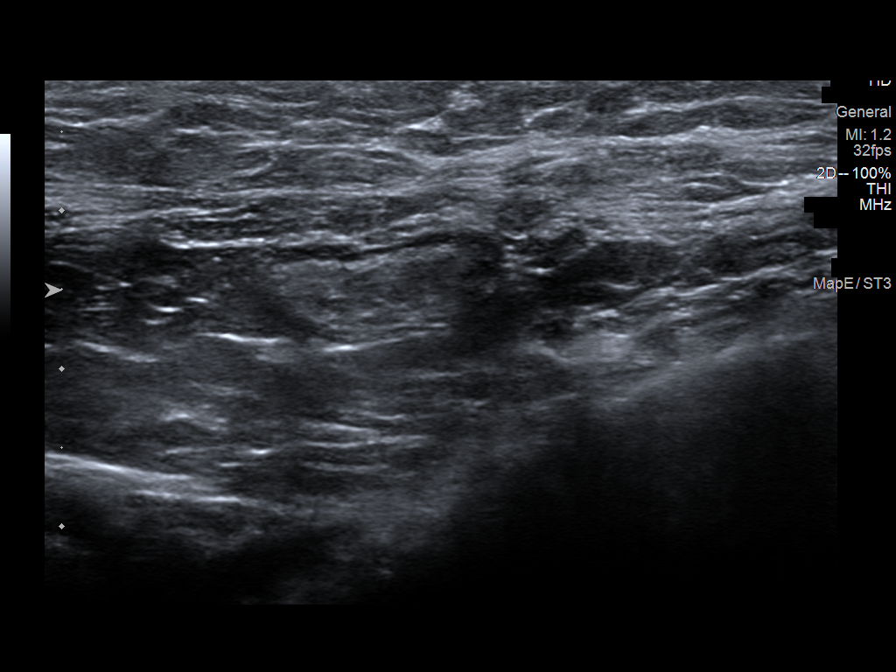
[im 9/9]
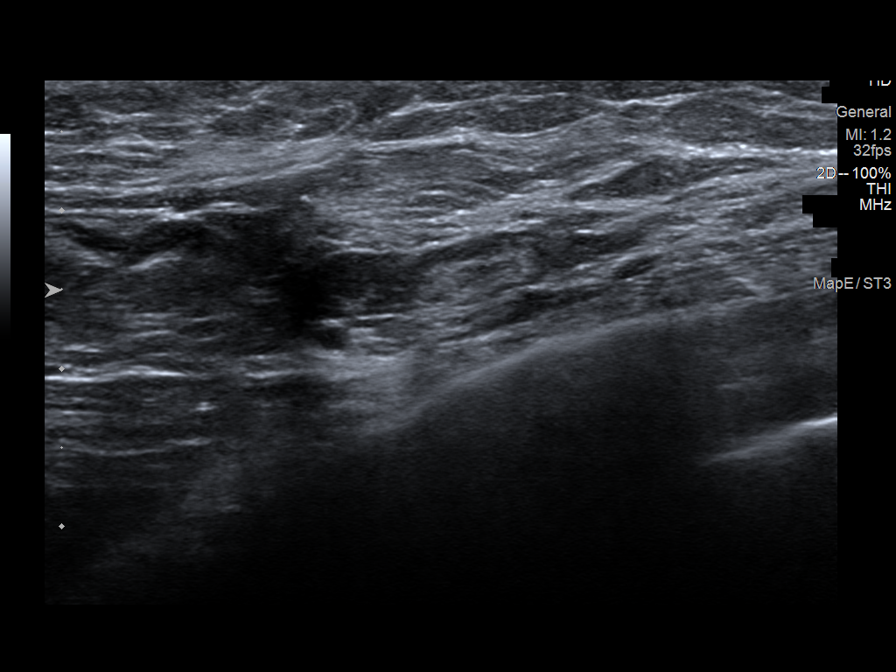

[9 of 9 positions shown; findings below may reference images not displayed]

ACR Breast Density Category b: There are scattered areas of
fibroglandular density.
FINDINGS: A partially obscured mass is confirmed within the upper-outer
quadrant of the LEFT breast, at posterior depth, measuring
approximately 9 mm greatest dimension.

Targeted ultrasound is performed, showing an oval hypoechoic mass in
the LEFT breast at the 2 o'clock axis, 8 cm from the nipple, with
partially indistinct margins, without internal vascularity,
measuring 7 x 3 x 7 mm, a likely correlate for the mammographic
finding.

LEFT axilla was evaluated with ultrasound showing no enlarged or
morphologically abnormal lymph nodes.
IMPRESSION: Hypoechoic mass in the LEFT breast at the 2 o'clock axis, 8 cm from
nipple, with partially indistinct margins, measuring 7 mm, a likely
correlate for the mammographic finding. This is a suspicious finding
for which ultrasound-guided core biopsy is recommended.

RECOMMENDATION:
1. Ultrasound-guided core biopsy of the LEFT breast mass at the 2
o'clock axis, 8 cm from the nipple, measuring 7 mm.
2. Postprocedure mammogram to ensure mammographic and sonographic
correspondence. If findings do not correspond, stereotactic biopsy
would be recommended.

Ultrasound-guided biopsy is scheduled for [DATE].

I have discussed the findings and recommendations with the patient.
If applicable, a reminder letter will be sent to the patient
regarding the next appointment.

BI-RADS CATEGORY  4: Suspicious.

## 2020-08-08 ENCOUNTER — Other Ambulatory Visit (HOSPITAL_COMMUNITY): Payer: Self-pay | Admitting: Neurology

## 2020-08-08 ENCOUNTER — Other Ambulatory Visit: Payer: Self-pay | Admitting: Diagnostic Radiology

## 2020-08-08 ENCOUNTER — Other Ambulatory Visit: Payer: Self-pay

## 2020-08-08 ENCOUNTER — Ambulatory Visit
Admission: RE | Admit: 2020-08-08 | Discharge: 2020-08-08 | Disposition: A | Discharge status: Home / Self Care | Payer: No Typology Code available for payment source | Source: Ambulatory Visit | Attending: Neurology | Admitting: Neurology

## 2020-08-08 ENCOUNTER — Ambulatory Visit
Admission: RE | Admit: 2020-08-08 | Discharge: 2020-08-08 | Disposition: A | Discharge status: Home / Self Care | Payer: No Typology Code available for payment source | Source: Ambulatory Visit | Attending: Family Medicine | Admitting: Family Medicine

## 2020-08-08 ENCOUNTER — Telehealth (HOSPITAL_COMMUNITY): Payer: Self-pay

## 2020-08-08 DIAGNOSIS — I671 Cerebral aneurysm, nonruptured: Secondary | ICD-10-CM

## 2020-08-08 DIAGNOSIS — R928 Other abnormal and inconclusive findings on diagnostic imaging of breast: Secondary | ICD-10-CM

## 2020-08-08 IMAGING — MG MM BREAST LOCALIZATION CLIP
4 series · 4 of 12 positions shown · non-contrast
Comparison: Previous exam(s).

CLINICAL DATA: 64-year-old female status post ultrasound-guided
biopsy of the left breast.

EXAM:
DIAGNOSTIC LEFT MAMMOGRAM POST ULTRASOUND BIOPSY

[L CC synth-2D]
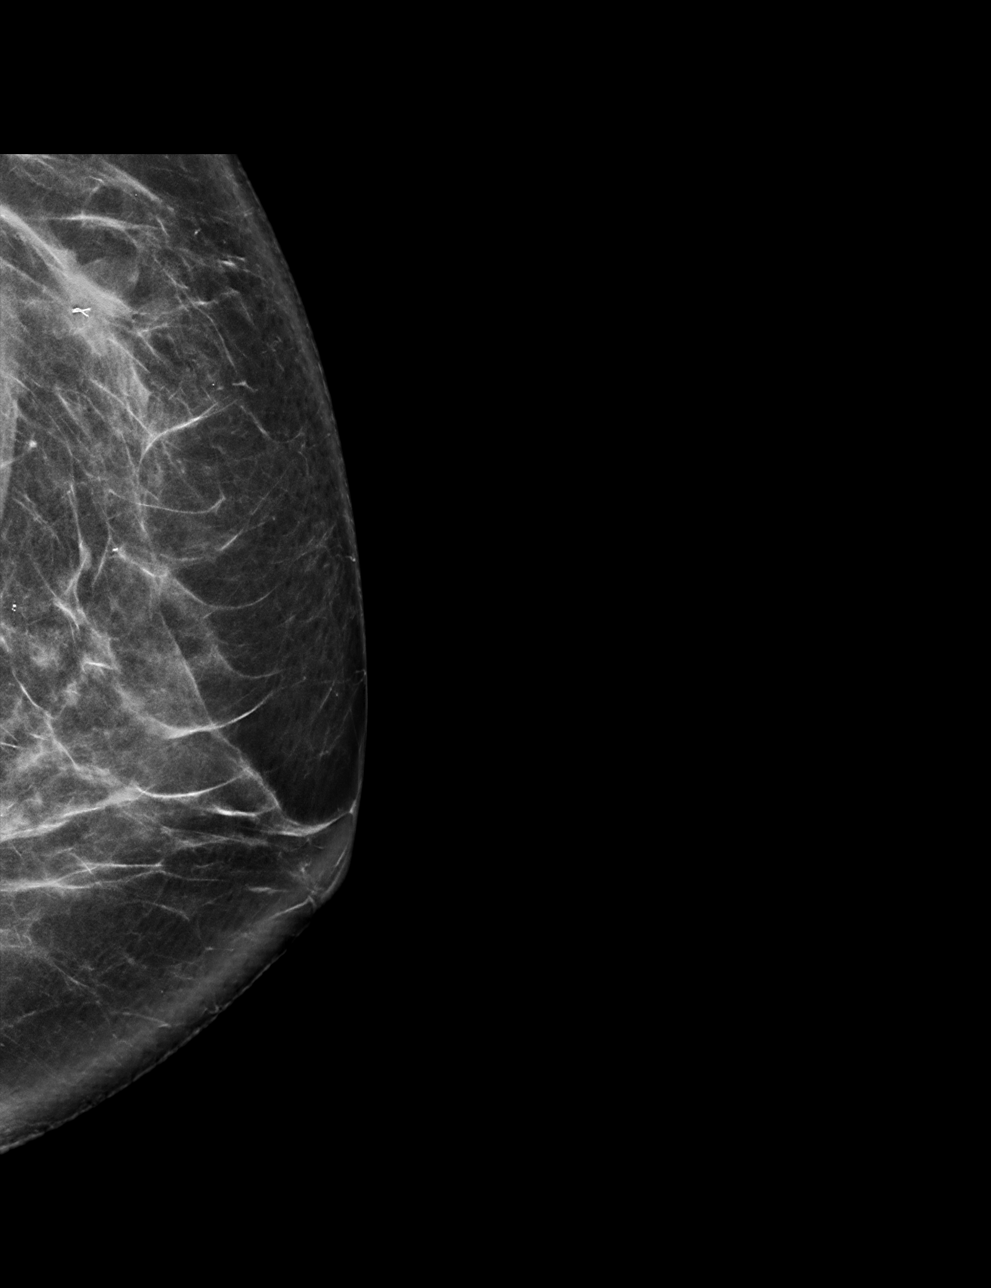

[L ML synth-2D]
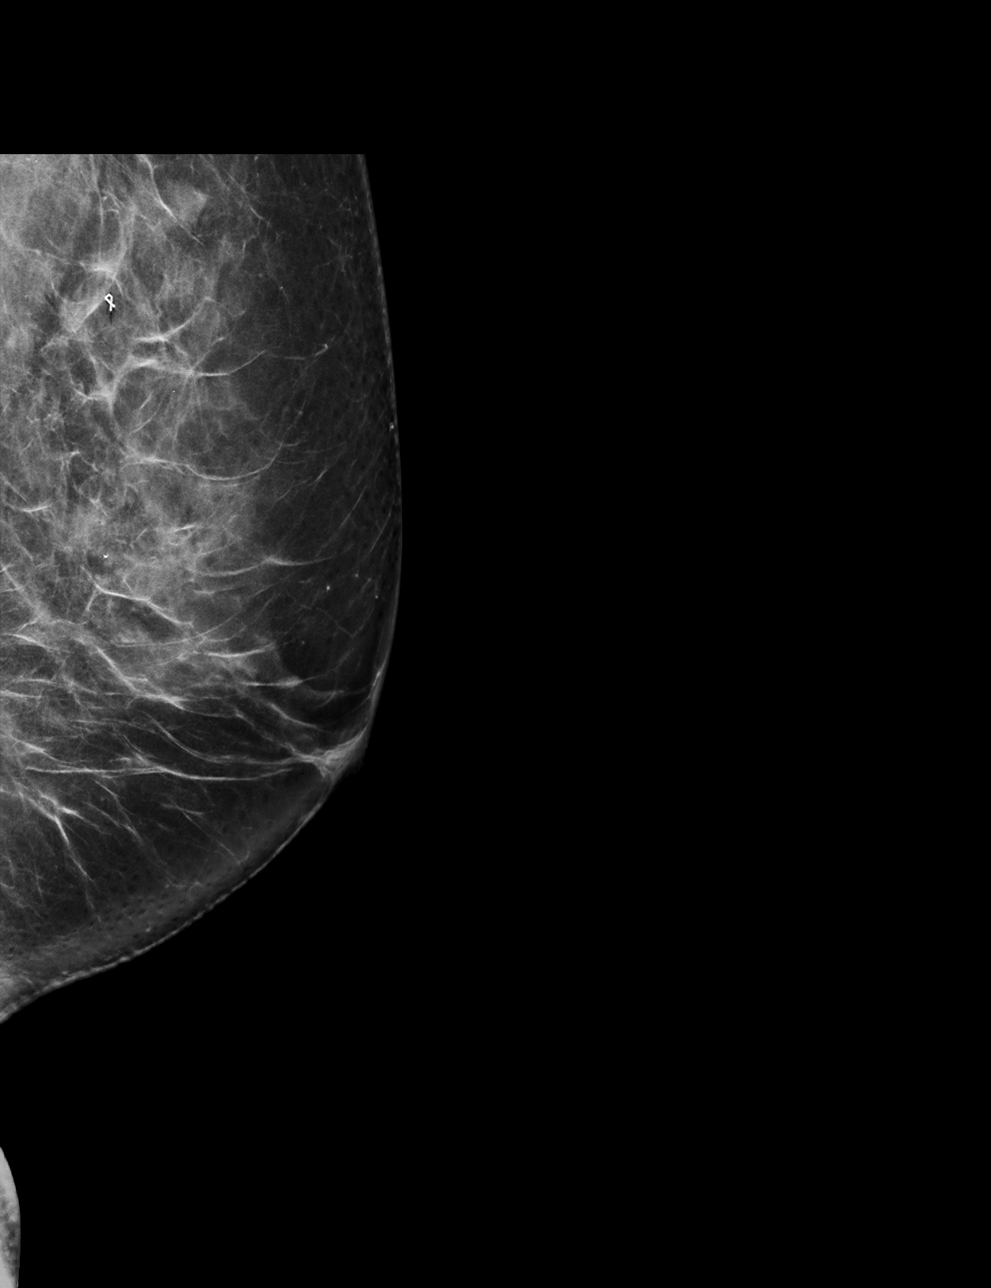

[L ML tomo · tomo slice 37/74.0]
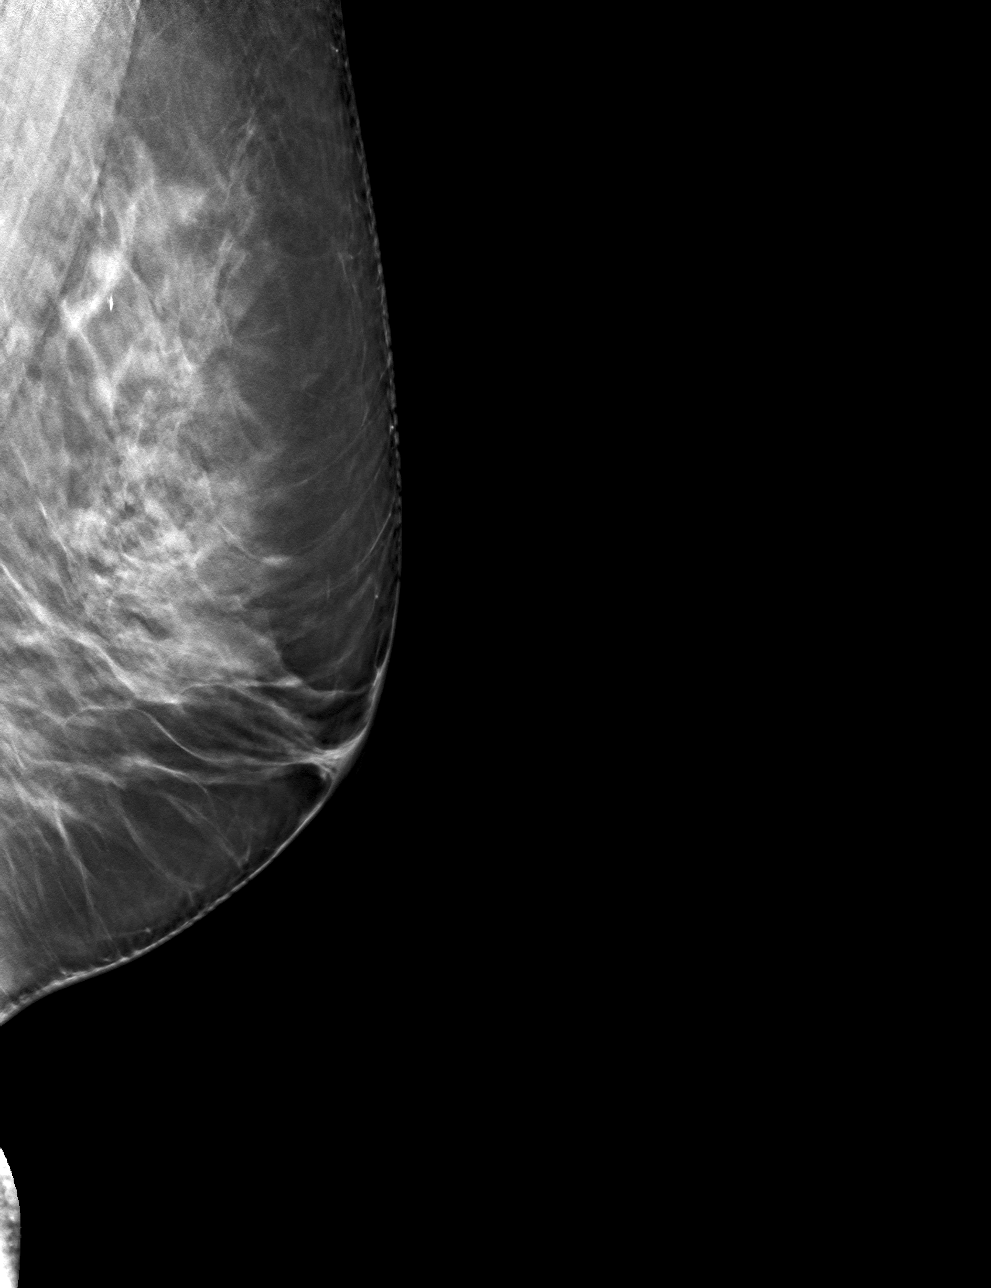

[L CC tomo · tomo slice 37/72.0]
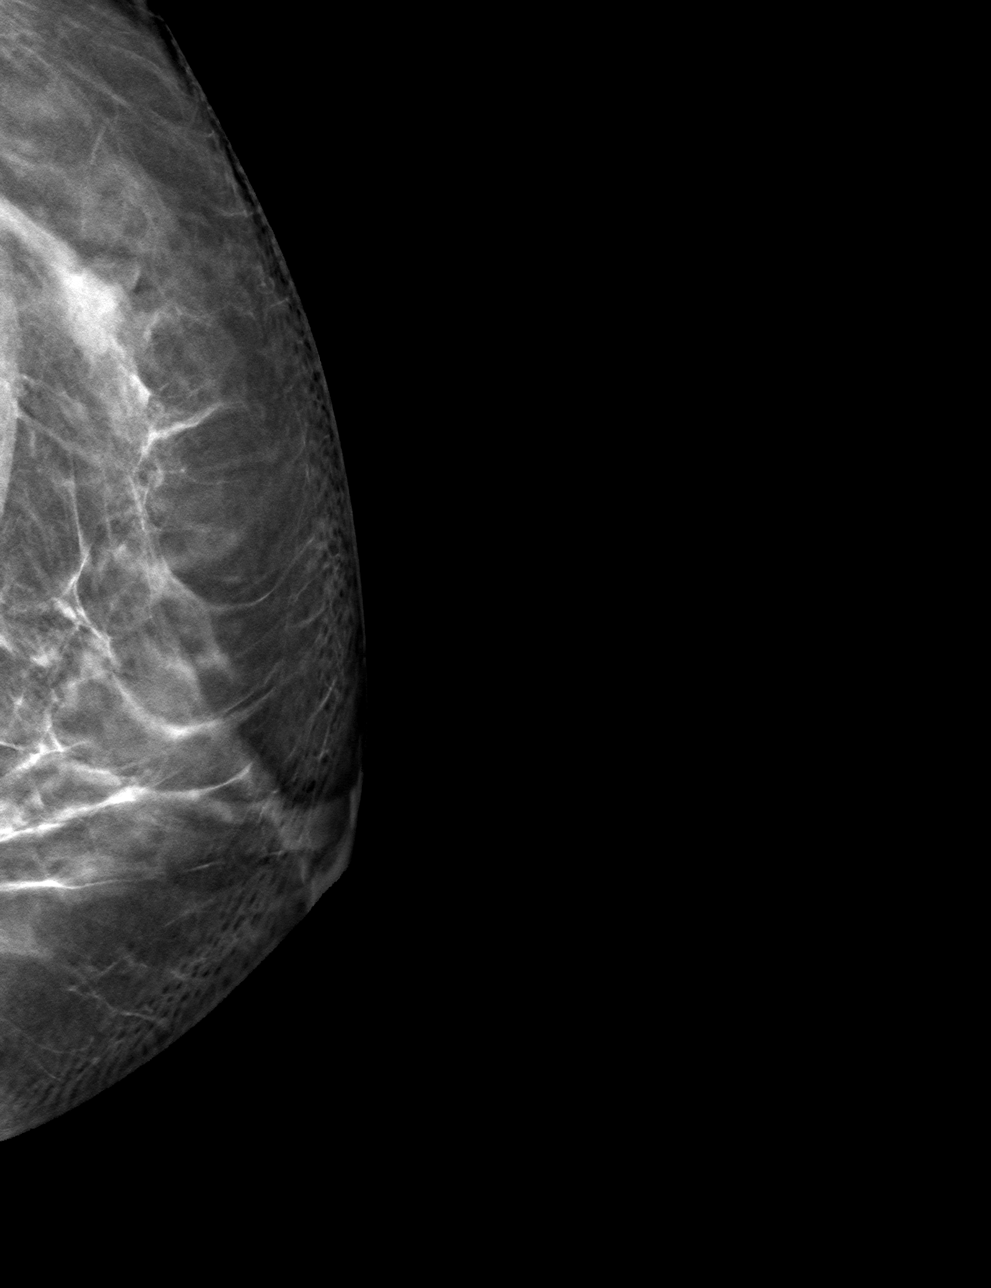

[4 of 12 positions shown; findings below may reference images not displayed]

FINDINGS: Mammographic images were obtained following ultrasound guided biopsy
of the left breast. The ribbon shaped clip is identified in the
upper-outer quadrant of the left breast. This does not correspond
with the mammographically identified mass which is located
superiorly.
IMPRESSION: Post biopsy clip does not correspond with the mammographically
identified mass. Additional stereotactic biopsy will be scheduled.

Final Assessment: Post Procedure Mammograms for Marker Placement

## 2020-08-08 IMAGING — US US BREAST BX W LOC DEV 1ST LESION IMG BX SPEC US GUIDE*L*
1 series · 9 of 9 positions shown · non-contrast
Comparison: Previous exam(s).
COMPARISON: Previous exam(s).

Addendum:
CLINICAL DATA: 64-year-old female with an indeterminate left breast
mass.

EXAM:
ULTRASOUND GUIDED LEFT BREAST CORE NEEDLE BIOPSY

[Series 1: us breast bx w loc dev 1st lesion img bx spec us g · 0.05mm/px · 9 of 9 slices shown]
[im 1/9]
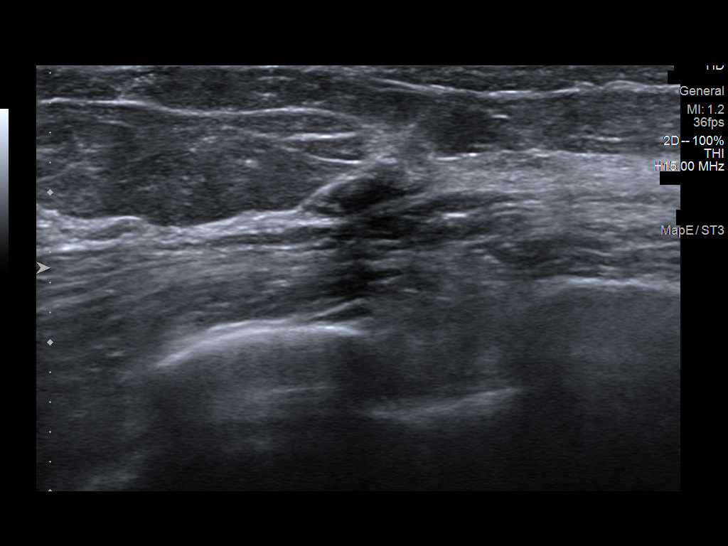
[im 2/9]
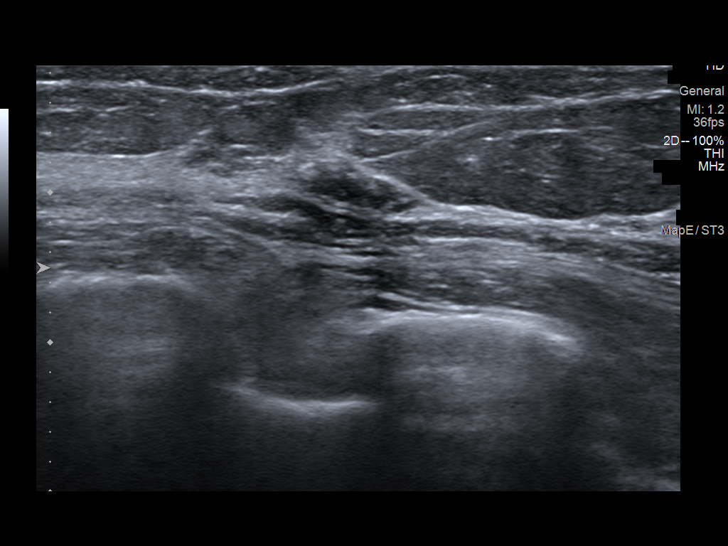
[im 3/9]
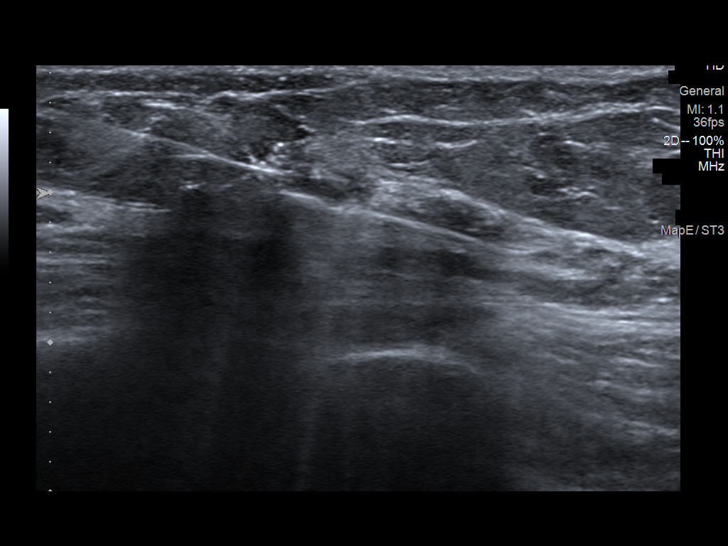
[im 4/9]
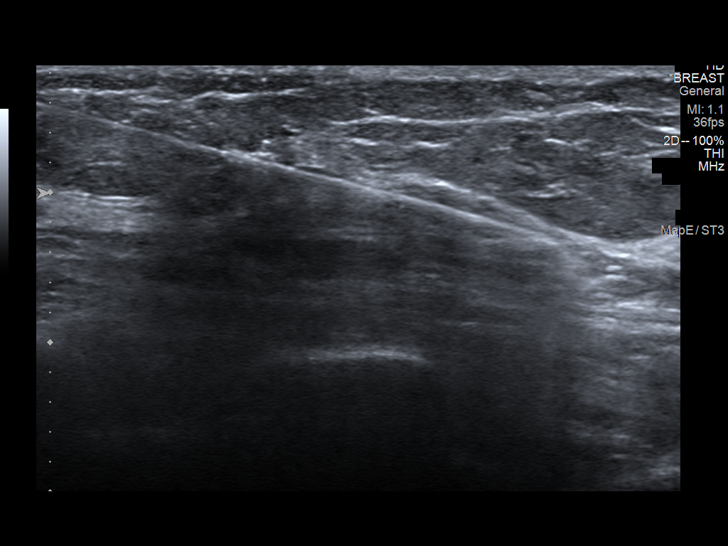
[im 5/9]
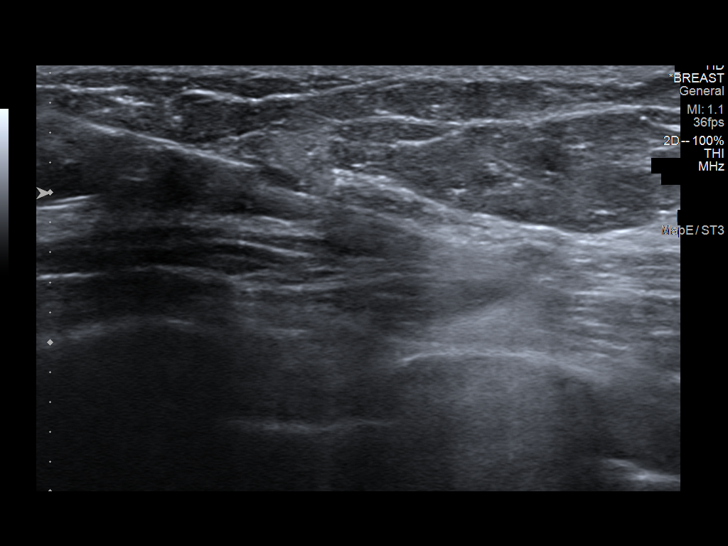
[im 6/9]
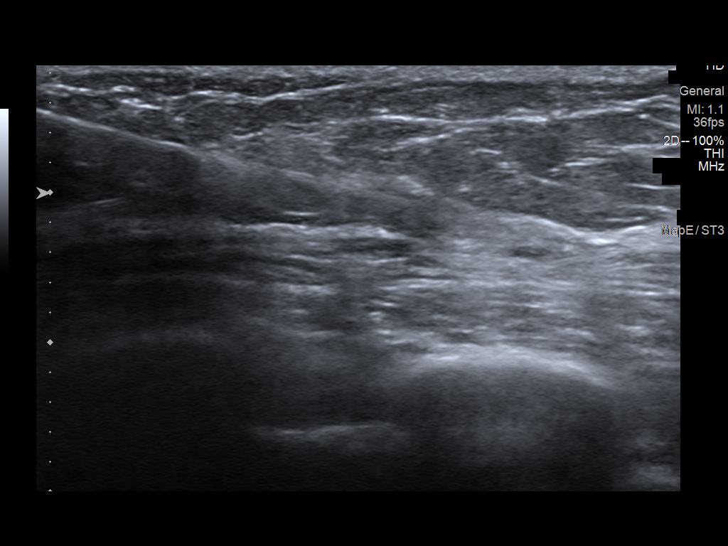
[im 7/9]
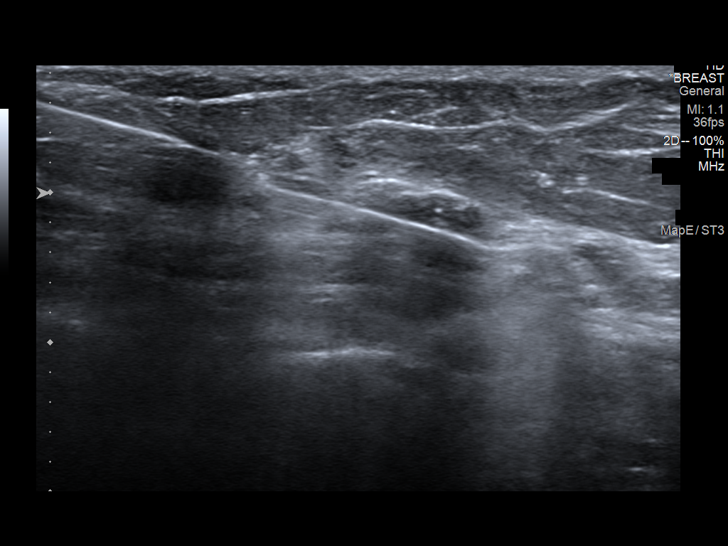
[im 8/9]
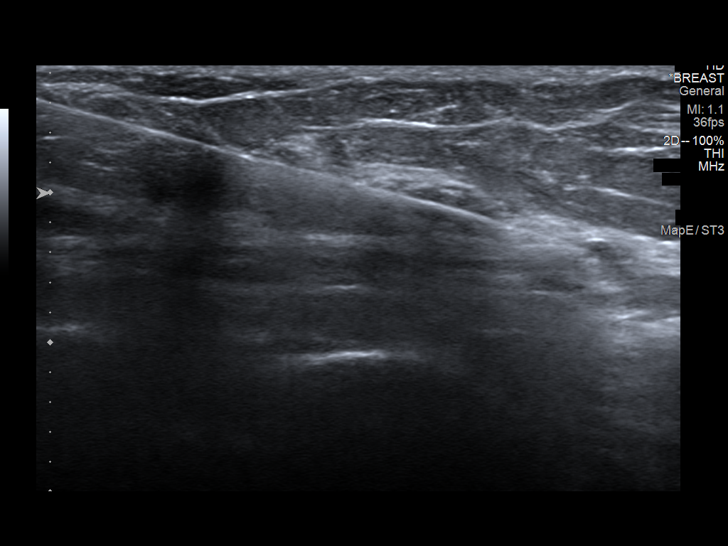
[im 9/9]
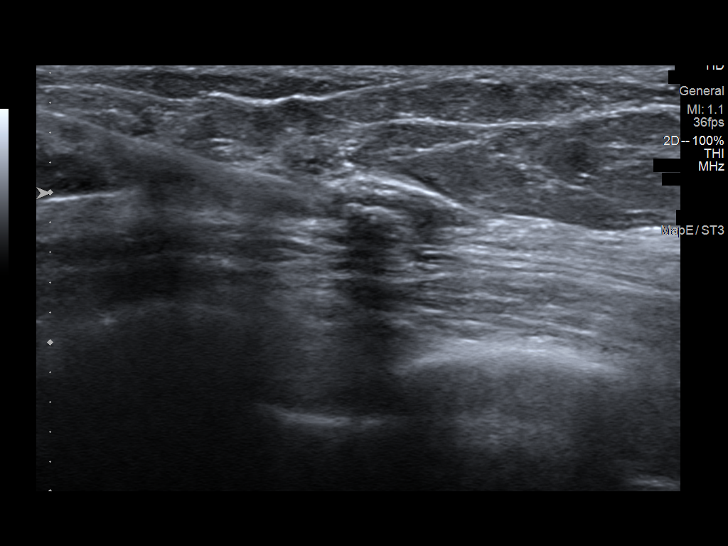

[9 of 9 positions shown; findings below may reference images not displayed]



Lesion quadrant: Upper outer quadrant

Using sterile technique and 1% Lidocaine as local anesthetic, under
direct ultrasound visualization, a 14 gauge INSCRUTABLE device was
used to perform biopsy of a mass at the 2 o'clock position using a
inferior approach. At the conclusion of the procedure a ribbon
shaped tissue marker clip was deployed into the biopsy cavity.
Follow up 2 view mammogram was performed and dictated separately.
IMPRESSION: Ultrasound guided biopsy of the left breast. No apparent
complications.

ADDENDUM:
Pathology revealed PSEUDOANGIOMATOUS STROMAL HYPERPLASIA (PASH) of
the Left breast, 2 o'clock, [VR]. This was found to be concordant
by Dr. INSCRUTABLE.

Pathology results were discussed with the patient by telephone. The
patient reported doing well after the biopsy with tenderness at the
site. Post biopsy instructions and care were reviewed and questions
were answered. The patient was encouraged to call The [REDACTED] for any additional concerns. My direct phone
number was provided.

The patient is scheduled for a left breast stereotatic guided biopsy
on [DATE]. Further recommendations will be guided by the
results of this biopsy.

Pathology results reported by INSCRUTABLE, RN on [DATE].



Lesion quadrant: Upper outer quadrant

Using sterile technique and 1% Lidocaine as local anesthetic, under
direct ultrasound visualization, a 14 gauge INSCRUTABLE device was
used to perform biopsy of a mass at the 2 o'clock position using a
inferior approach. At the conclusion of the procedure a ribbon
shaped tissue marker clip was deployed into the biopsy cavity.
Follow up 2 view mammogram was performed and dictated separately.
IMPRESSION: Ultrasound guided biopsy of the left breast. No apparent
complications.

## 2020-08-08 NOTE — Telephone Encounter (Signed)
Pt agreed to f/u in 6 months with mri/mra. AW  

## 2020-08-09 ENCOUNTER — Other Ambulatory Visit: Payer: Self-pay | Admitting: Family Medicine

## 2020-08-09 DIAGNOSIS — R928 Other abnormal and inconclusive findings on diagnostic imaging of breast: Secondary | ICD-10-CM

## 2020-08-23 ENCOUNTER — Other Ambulatory Visit: Payer: Self-pay

## 2020-08-23 ENCOUNTER — Ambulatory Visit
Admission: RE | Admit: 2020-08-23 | Discharge: 2020-08-23 | Disposition: A | Discharge status: Home / Self Care | Payer: No Typology Code available for payment source | Source: Ambulatory Visit | Attending: Family Medicine | Admitting: Family Medicine

## 2020-08-23 DIAGNOSIS — R928 Other abnormal and inconclusive findings on diagnostic imaging of breast: Secondary | ICD-10-CM

## 2020-08-23 IMAGING — MG MM BREAST LOCALIZATION CLIP
4 series · 4 of 12 positions shown · non-contrast
Comparison: Previous exam(s).

CLINICAL DATA: Evaluate post biopsy marker clip placement following
stereotactic core needle biopsy a left breast mass versus focal
asymmetry.

EXAM:
DIAGNOSTIC LEFT MAMMOGRAM POST STEREOTACTIC BIOPSY

[L CC synth-2D]
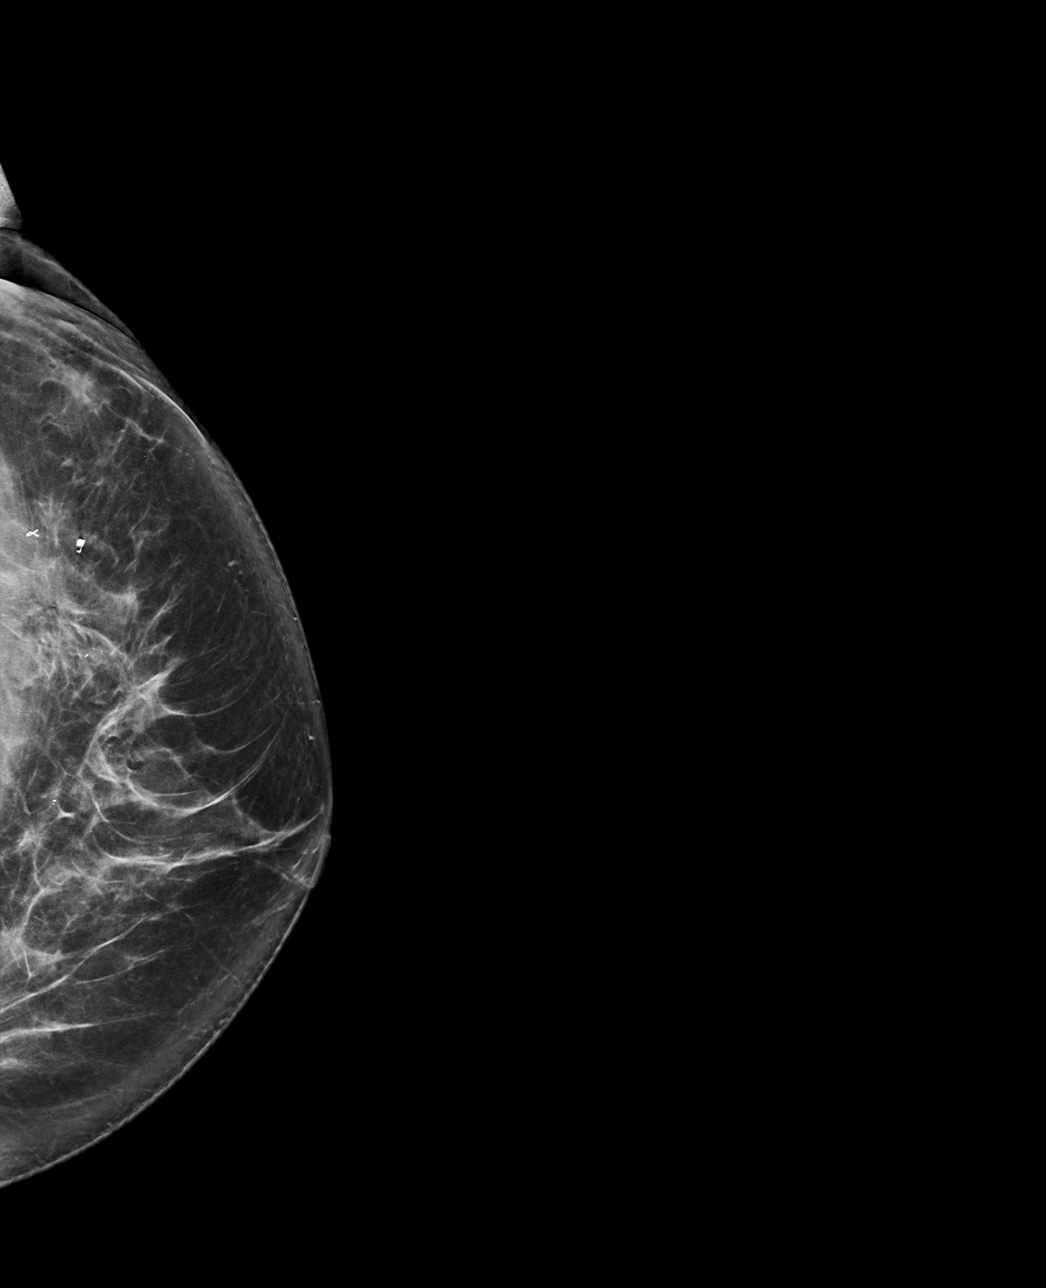

[L LM synth-2D]
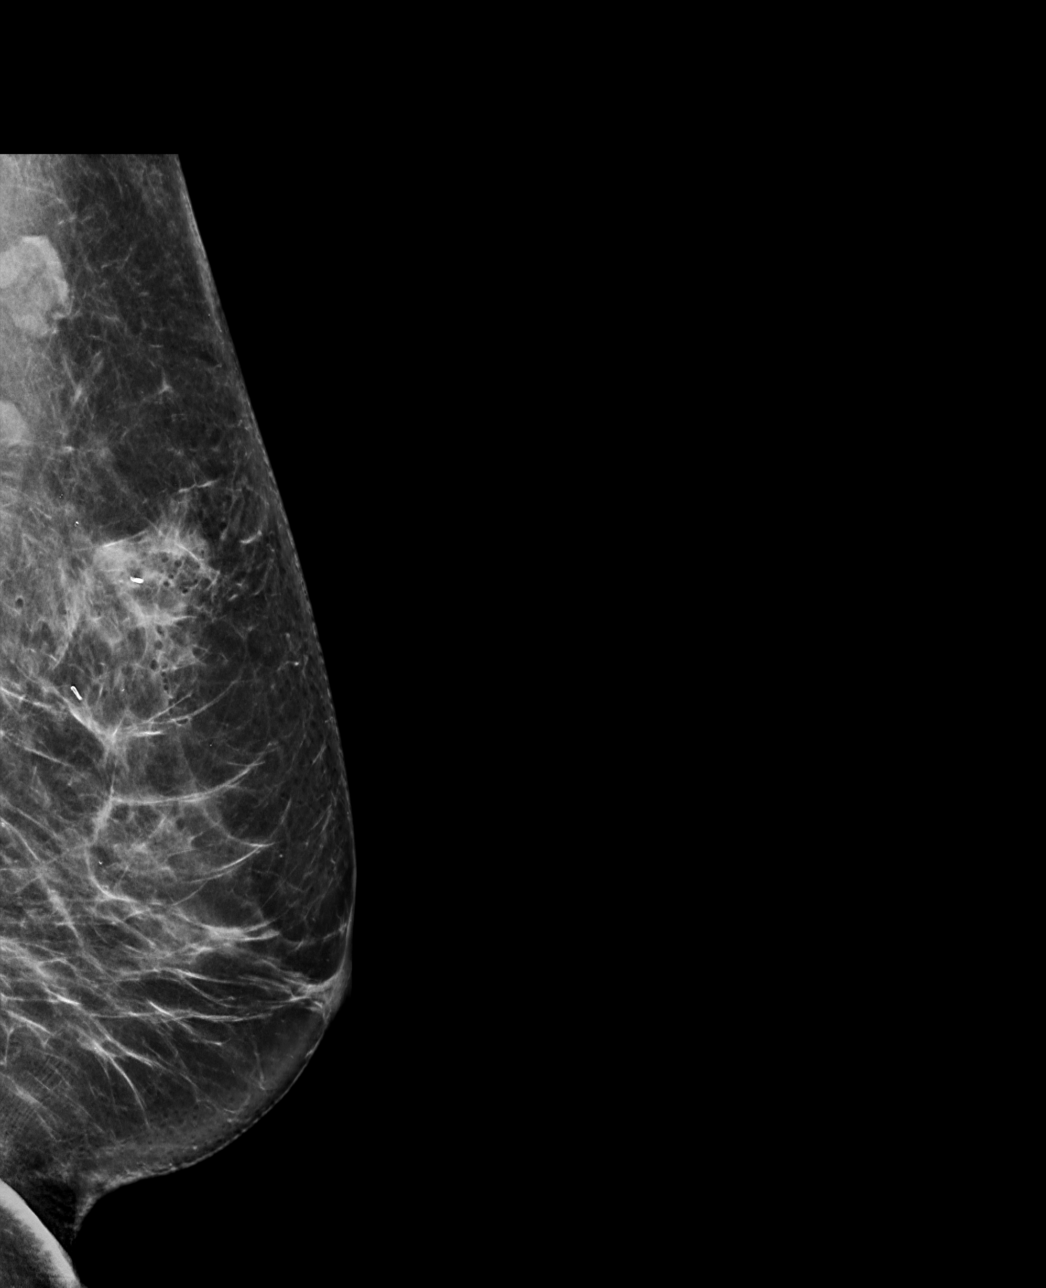

[L LM tomo · tomo slice 39/77.0]
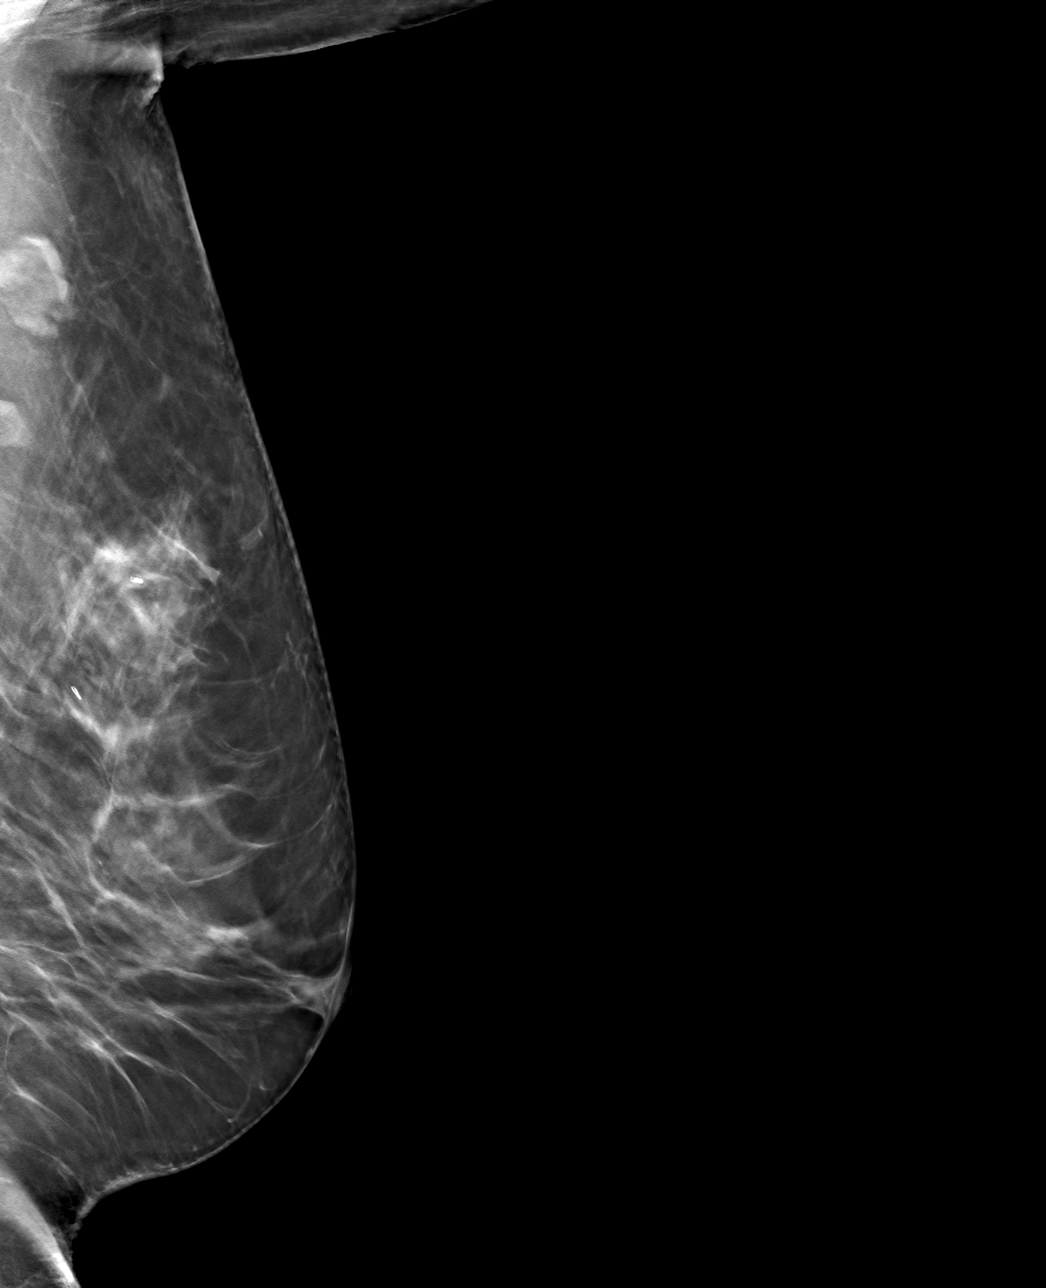

[L CC tomo · tomo slice 41/80.0]
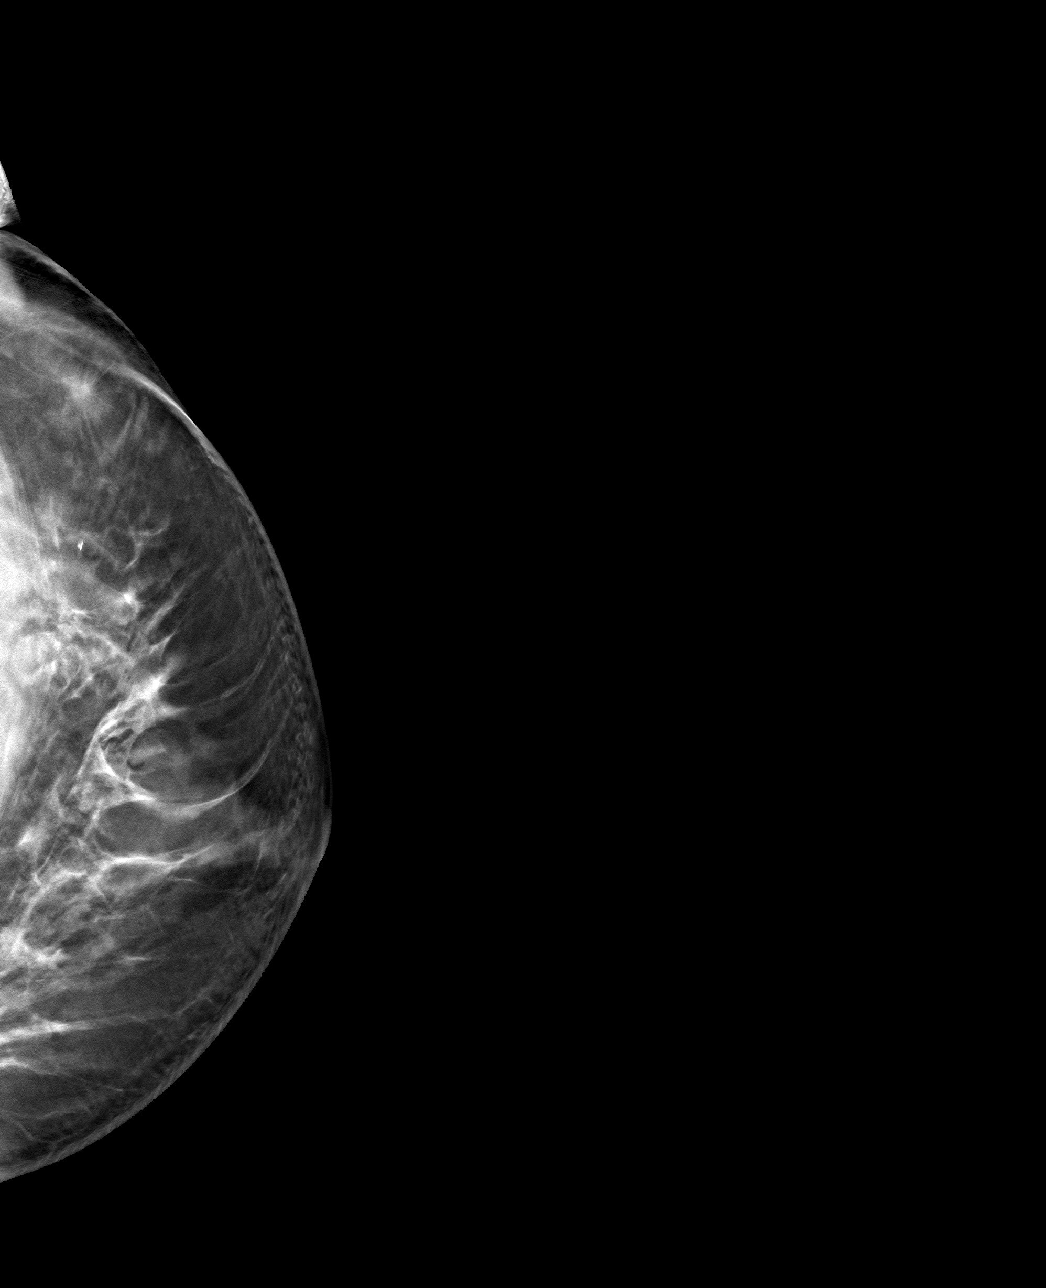

[4 of 12 positions shown; findings below may reference images not displayed]

FINDINGS: Mammographic images were obtained following stereotactic guided
biopsy of a left breast mass versus focal asymmetry. The biopsy
marking clip is in expected position at the site of biopsy. The coil
shaped biopsy clip lies superior and slightly anterior to the ribbon
clip from the previous ultrasound biopsy.
IMPRESSION: Appropriate positioning of the coil shaped biopsy marking clip at
the site of biopsy in the upper outer left breast, along the
anterior margin of the mass/focal asymmetry.

Final Assessment: Post Procedure Mammograms for Marker Placement

## 2020-08-23 IMAGING — MG MM BREAST BX W LOC DEV 1ST LESION IMAGE BX SPEC STEREO GUIDE*L*
8 of 14 series · 8 of 40 positions shown · non-contrast
Comparison: Previous exams.
COMPARISON: Previous exams.

Addendum:
CLINICAL DATA: Patient presents for stereotactic core needle biopsy
of a mass versus focal asymmetry in the upper outer left breast. The
patient underwent an ultrasound-guided core needle biopsy of a mass
in this same general location on [DATE], felt to represent the
mammographic abnormality. Pathology from this biopsy was Pash,
concordant. The biopsy clip, however, did not correspond to the
mammographic mass/asymmetry, although is in close proximity. For
this reason, biopsy of the mammographic asymmetry/mass with
stereotactic guidance was recommended.

EXAM:
LEFT BREAST STEREOTACTIC CORE NEEDLE BIOPSY

[L (1 of 7)]
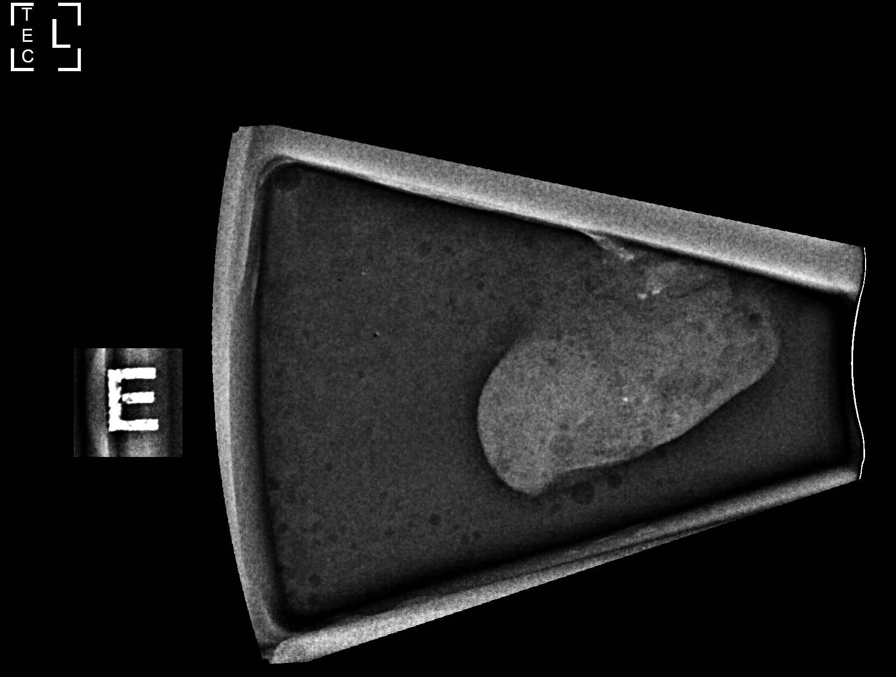

[L (2 of 7)]
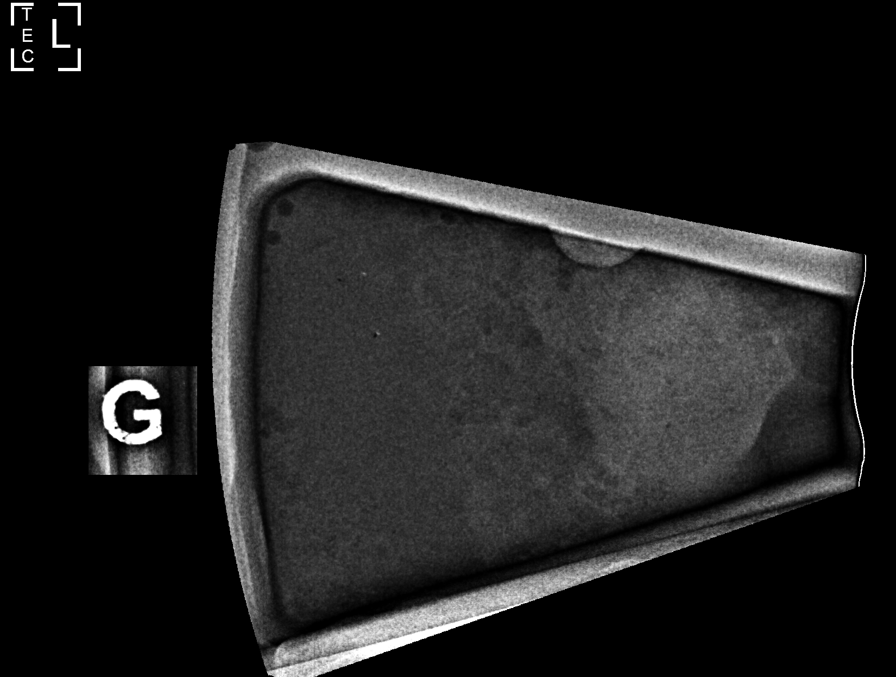

[L (3 of 7)]
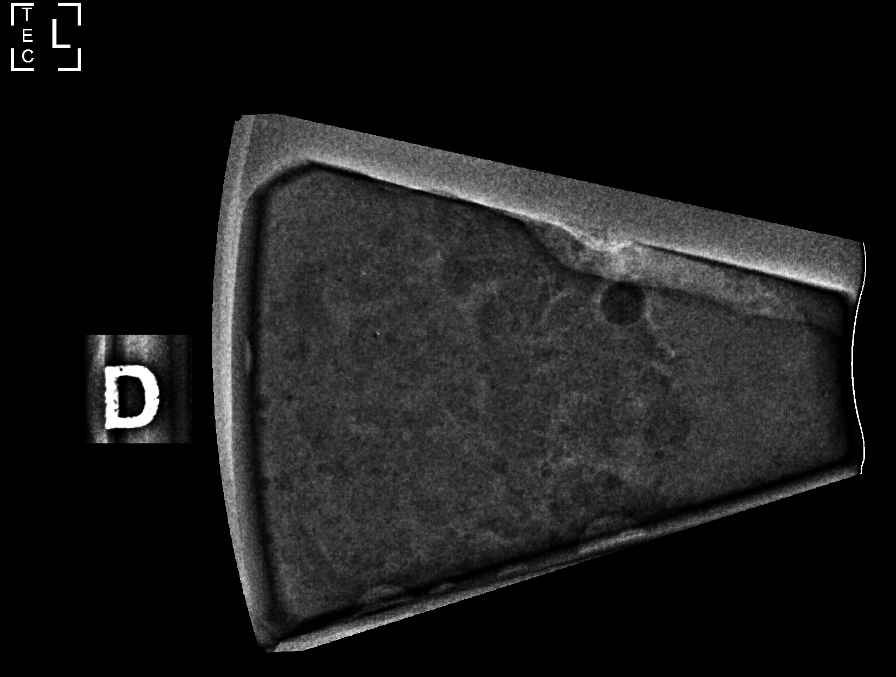

[L (4 of 7)]
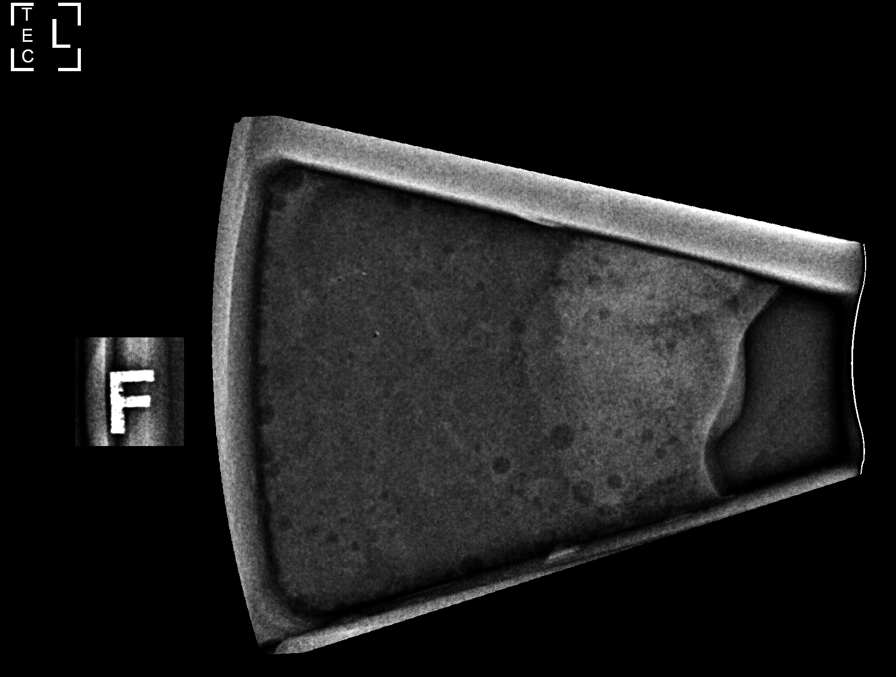

[L (5 of 7)]
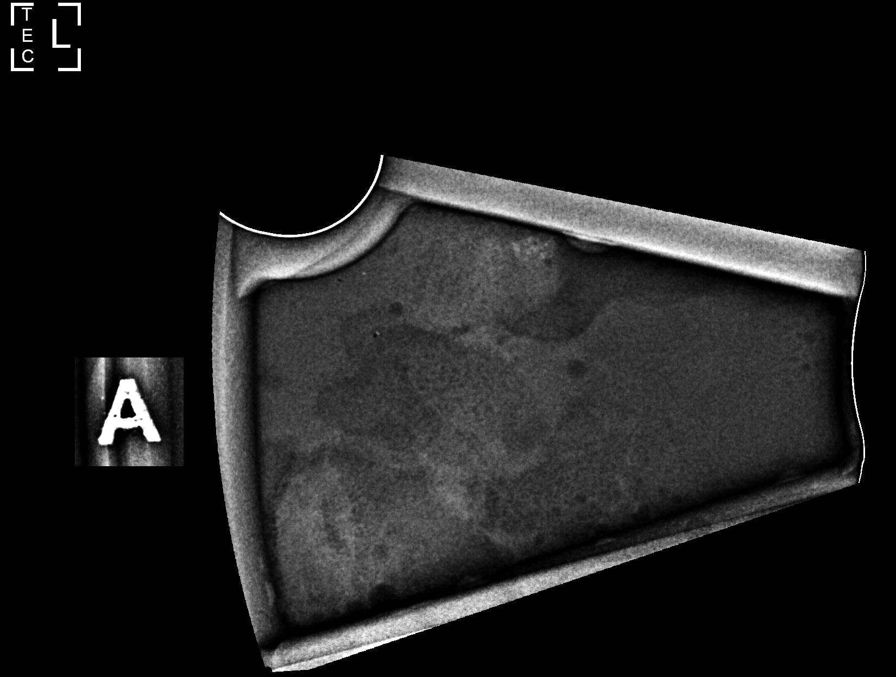

[L (6 of 7)]
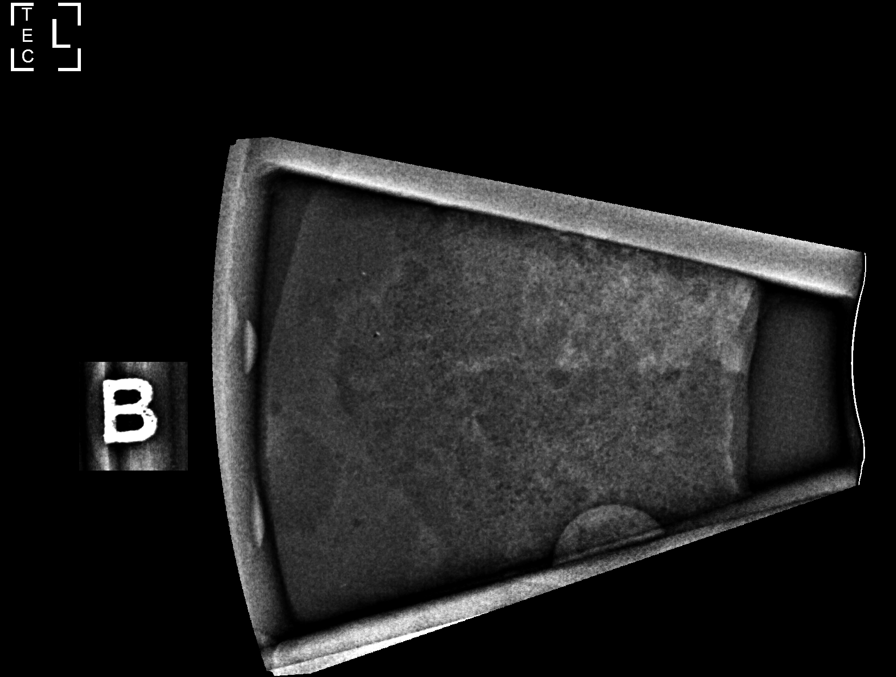

[L (7 of 7)]
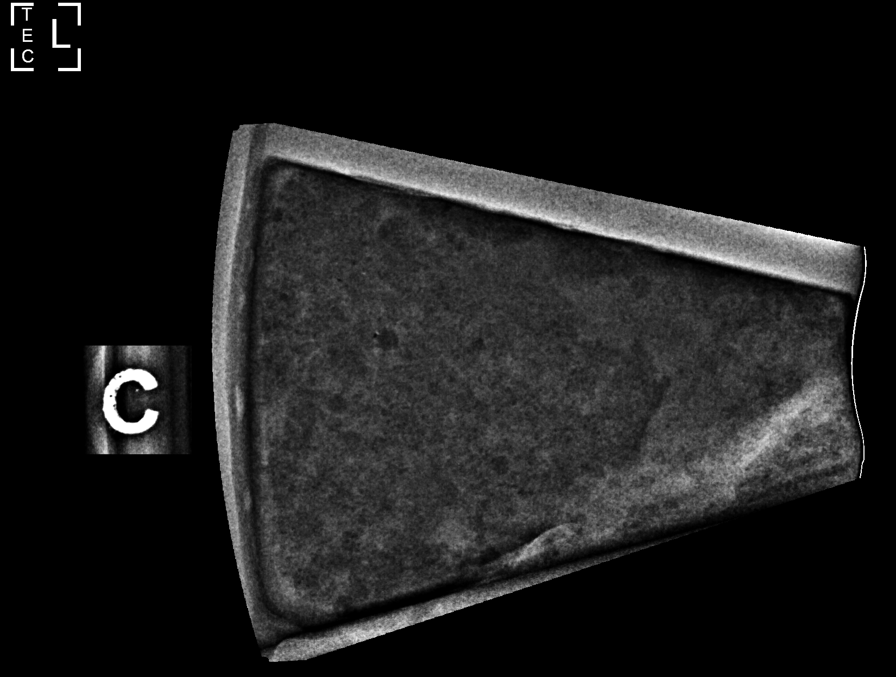

[R LM tomo · tomo slice 25/49.0]
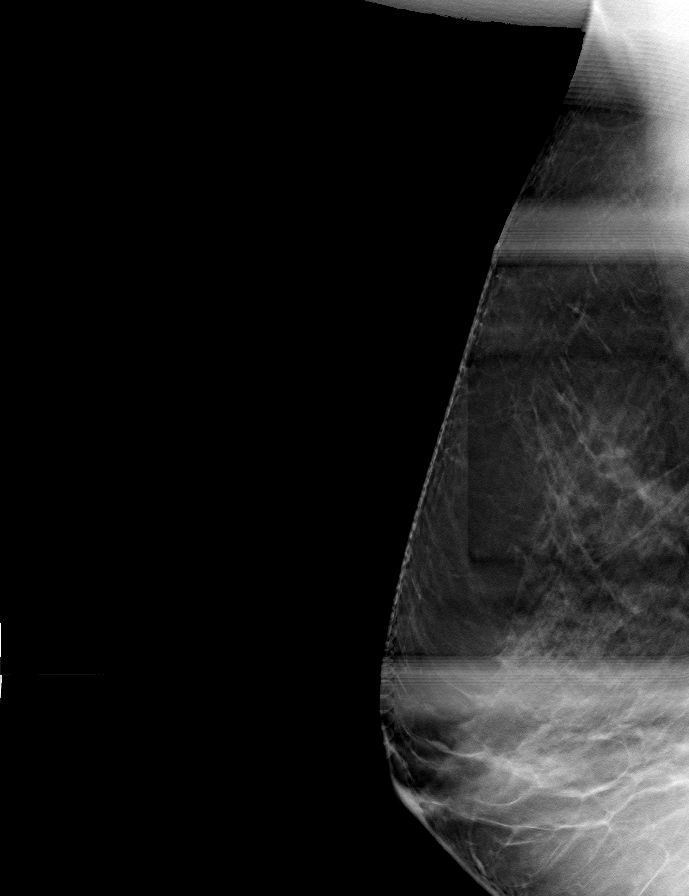

[8 of 40 positions shown; findings below may reference images not displayed]



Using sterile technique and 1% Lidocaine as local anesthetic, under
stereotactic guidance, a 9 gauge vacuum assisted device was used to
perform core needle biopsy of the mass versus asymmetry in the upper
outer quadrant of the left breast using a lateral approach.

Lesion quadrant: Upper outer quadrant

At the conclusion of the procedure, coil shaped tissue marker clip
was deployed into the biopsy cavity. Follow-up 2-view mammogram was
performed and dictated separately.
IMPRESSION: Stereotactic-guided biopsy of a left breast mass/asymmetry. No
apparent complications.

ADDENDUM:
Pathology revealed FIBROCYSTIC CHANGE WITH CALCIFICATIONS of the
Left breast, upper outer quadrant. This was found to be concordant
by Dr. TIMALSINA.

Pathology results were discussed with the patient by telephone. The
patient reported doing well after the biopsy with tenderness at the
site. Post biopsy instructions and care were reviewed and questions
were answered. The patient was encouraged to call The [REDACTED] for any additional concerns. My direct phone
number was provided.

The patient was instructed to return for annual screening
mammography.

Pathology results reported by TIMALSINA, RN on [DATE].



Using sterile technique and 1% Lidocaine as local anesthetic, under
stereotactic guidance, a 9 gauge vacuum assisted device was used to
perform core needle biopsy of the mass versus asymmetry in the upper
outer quadrant of the left breast using a lateral approach.

Lesion quadrant: Upper outer quadrant

At the conclusion of the procedure, coil shaped tissue marker clip
was deployed into the biopsy cavity. Follow-up 2-view mammogram was
performed and dictated separately.
IMPRESSION: Stereotactic-guided biopsy of a left breast mass/asymmetry. No
apparent complications.

## 2020-09-21 ENCOUNTER — Other Ambulatory Visit: Payer: Self-pay | Admitting: Cardiology

## 2020-11-27 ENCOUNTER — Ambulatory Visit: Payer: No Typology Code available for payment source | Attending: Internal Medicine

## 2020-11-27 ENCOUNTER — Other Ambulatory Visit: Payer: Self-pay

## 2020-11-27 ENCOUNTER — Other Ambulatory Visit (HOSPITAL_BASED_OUTPATIENT_CLINIC_OR_DEPARTMENT_OTHER): Payer: Self-pay

## 2020-11-27 DIAGNOSIS — Z23 Encounter for immunization: Secondary | ICD-10-CM

## 2020-11-27 MED ORDER — COVID-19 MRNA VACC (MODERNA) 100 MCG/0.5ML IM SUSP
INTRAMUSCULAR | 0 refills | Status: DC
  Filled 2020-11-27: qty 0.25, 1d supply, fill #0

## 2020-11-27 NOTE — Progress Notes (Signed)
   Covid-19 Vaccination Clinic  Name:  Kyliyah Stirn    MRN: [id number] DOB: [birth year]  11/27/2020  Ms. Gero was observed post Covid-19 immunization for 15 minutes without incident. She was provided with Vaccine Information Sheet and instruction to access the V-Safe system.   Ms. Staubs was instructed to call 911 with any severe reactions post vaccine: Marland Kitchen Difficulty breathing  . Swelling of face and throat  . A fast heartbeat  . A bad rash all over body  . Dizziness and weakness   Immunizations Administered    Name Date Dose VIS Date Route   Moderna Covid-19 Booster Vaccine 11/27/2020  1:21 PM 0.25 mL 04/12/2020 Intramuscular   Manufacturer: Moderna   Lot: [id number]   NDC: 88502-774-12

## 2020-11-30 ENCOUNTER — Ambulatory Visit: Payer: Medicare Other | Admitting: Adult Health

## 2020-12-26 ENCOUNTER — Encounter: Payer: Self-pay | Admitting: Adult Health

## 2020-12-26 ENCOUNTER — Ambulatory Visit (INDEPENDENT_AMBULATORY_CARE_PROVIDER_SITE_OTHER): Payer: No Typology Code available for payment source | Admitting: Adult Health

## 2020-12-26 VITALS — BP 133/88 | HR 66 | Ht 64.0 in | Wt 145.0 lb

## 2020-12-26 DIAGNOSIS — E785 Hyperlipidemia, unspecified: Secondary | ICD-10-CM | POA: Diagnosis not present

## 2020-12-26 DIAGNOSIS — I63512 Cerebral infarction due to unspecified occlusion or stenosis of left middle cerebral artery: Secondary | ICD-10-CM | POA: Diagnosis not present

## 2020-12-26 DIAGNOSIS — I671 Cerebral aneurysm, nonruptured: Secondary | ICD-10-CM | POA: Diagnosis not present

## 2020-12-26 DIAGNOSIS — I10 Essential (primary) hypertension: Secondary | ICD-10-CM | POA: Diagnosis not present

## 2020-12-26 NOTE — Patient Instructions (Signed)
Continue aspirin 325 mg daily  and Crestor for secondary stroke prevention  Follow-up with Dr. Corliss Skains next month for repeat imaging and monitoring of your coiled aneurysm  Continue to follow up with PCP regarding cholesterol and blood pressure management  Maintain strict control of hypertension with blood pressure goal below 130/90 and cholesterol with LDL cholesterol (bad cholesterol) goal below 70 mg/dL.        Thank you for coming to see [place] at [facility]. I hope we have been able to provide you high quality care today.  You may receive a patient satisfaction survey over the next few weeks. We would appreciate your feedback and comments so that we may continue to improve ourselves and the health of our patients.

## 2020-12-26 NOTE — Progress Notes (Signed)
[facility] [address] [place] [phone]       STROKE FOLLOW UP NOTE  Ms. Kendra Evansatherine McDougald Prentiss Richardson Date of Birth:  1955/07/25 Medical Record Number:  [id number]   Reason for Referral: stroke follow up    SUBJECTIVE:   CHIEF COMPLAINT:  Chief Complaint  Patient presents with   Follow-up    Rm 3 alone Pt is well and stable     HPI:   Today, 12/26/2020, Ms. Prentiss Richardson returns for 4468-month stroke follow-up unaccompanied.  Stable since prior visit without new or reoccurring stroke/TIA symptoms.  Compliant on aspirin and Crestor without associated side effects.  Blood pressure today 133/88.  Did have repeat MRA head in 07/2020 with stable appearance of left aneurysm coiling with plans on repeating imaging around August.  Prior complaints of right shoulder pain greatly improving with only mild occasional pain.  No further concerns at this time.    History provided for reference purposes only Update 06/12/2020 JM: Ms. Prentiss Richardson returns for 7768-month stroke follow-up.  Stable since prior visit without new or reoccurring stroke/TIA symptoms.  Evaluated by Dr. 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  She was advised to discontinue Plavix and increase aspirin to 325 mg daily.  Recommended MRI brain and MRA head late November/early December -patient currently awaiting for call to schedule.  She has been doing well since procedure but does report mild right shoulder pain and decreased ROM with onset 2 to 3 weeks post procedure.  She has been doing exercises and has been slowly improving.  Remains on full dose aspirin without bleeding or bruising.  Remains on Crestor without myalgias.  Blood pressure today 144/85. Monitors at home and has been stable typically 110-120/80s.  No further concerns.  Initial visit 12/01/2019 JM:  Ms. Prentiss Richardson is being seen for hospital follow-up accompanied by her sister.  She has been doing since discharge without residual deficits and has returned back to baseline.  Initially experiencing headaches which have since improved.  She questions possible return to driving.  She has continued on DAPT without bleeding or bruising.  Experienced statin myalgias on atorvastatin therefore discontinued and initiated Crestor with resolution of myalgias.  Blood pressure today 114/79.  Currently wearing cardiac monitor which will be completed next week.  Follow-up with Dr. Corliss Skainseveshwar on 11/05/2019 with patient wishing to proceed with diagnostic angiogram with intent to treat left PCOM aneurysm.  Per sister, they have attempted to contact office but have not received a return call.  No further concerns at this time.  Stroke admission 10/26/2019 Ms. Kendra EvansCatherine McDougald Prentiss Richardson is a 65 y.o. female with history of systolic heart failure, CKD 3, hypertension  who presented [facility] 10[facility]26/2019 [facility] ED with syncope / LOC with resultant blurred and double vision, HA.  Stroke work-up revealed 2 punctate MCA cortical infarcts (left frontal lobe) most likely secondary to large vessel disease source in the setting of syncopal event although cardioembolic source cannot be completely ruled out.  CTA showed moderate to advanced intracranial atherosclerosis and left PCOM aneurysm 5 mm.  Recommended DAPT for 3 months then Plavix alone due to intracranial stenosis.  Recommended 30-day cardiac event monitor outpatient to follow AF.  Syncopal episode possibly related to BP medication transition without further syncopal events during admission.  History of HTN stable but elevated during admission currently being managed by cardiology.  History of cardiomyopathy with  2D echo 50 to 55% (prior 30 to 35%) with ongoing follow-up by cardiology.  LDL 101 initiate atorvastatin 40 mg daily.  Will follow up outpatient with Dr. Corliss Skains in  regards to cerebral aneurysm.  Other stroke risk factors include advanced age, former tobacco use, family history of stroke and chronic systolic CHF but no prior stroke history.  Other active problems include CKD stage III.  Evaluated by therapies and recommended outpatient PT and discharged home in stable condition.  Stroke: 2 punctate MCA cortical infarcts most likely secondary to large vessel disease source in the setting of syncopal episode.  Cardioembolic source cannot be can be ruled out. MRI  2 punctate L frontal lobe infarcts. Moderate small vessel disease. Scattered chronic microhemorrhages. CTA head & neck no LVO. Moderate to advanced intracranial atherosclerosis (L P1 severe, B P2 stenoses, B ICA siphon stenoses, L MCA and B PCA small vessel atherosclerosis). L PCOM aneurysm 13mm. 2D Echo EF 50 to 50% LDL 101 HgbA1c 5.6 Lovenox 40 mg sq daily for VTE prophylaxis aspirin 81 mg daily prior to admission, now on aspirin 325 mg daily and clopidogrel 75 mg daily. Continue DAPT x 3 months then plavix alone given intracranial stenosis Consider 30-day cardiac event monitoring to rule out AF Therapy recommendations:  OP PT, no SLP Disposition:  home       ROS:   14 system review of systems performed and negative with exception of those listed in HPI  PMH:  Past Medical History:  Diagnosis Date   Abnormal radiographic examination    CHF (congestive heart failure) (HCC)    Chronic kidney disease, stage III (moderate) (HCC)    Chronic kidney disease, stage III (moderate) (HCC)    CN (constipation)    Complication of anesthesia    Essential hypertension, malignant    HTN (hypertension)    LVH (left ventricular hypertrophy)    PONV (postoperative nausea and vomiting)    Protein-calorie malnutrition (HCC)     PSH:  Past Surgical History:  Procedure Laterality Date   BREAST EXCISIONAL BIOPSY Left    IR 3D INDEPENDENT WKST  12/23/2019   IR ANGIO INTRA EXTRACRAN SEL COM CAROTID  INNOMINATE BILAT MOD SED  12/23/2019   IR ANGIO INTRA EXTRACRAN SEL INTERNAL CAROTID UNI L MOD SED  01/20/2020   IR ANGIO VERTEBRAL SEL SUBCLAVIAN INNOMINATE UNI L MOD SED  12/23/2019   IR ANGIO VERTEBRAL SEL VERTEBRAL UNI R MOD SED  12/23/2019   IR ANGIOGRAM FOLLOW UP STUDY  01/20/2020   IR ANGIOGRAM FOLLOW UP STUDY  01/20/2020   IR ANGIOGRAM FOLLOW UP STUDY  01/20/2020   IR TRANSCATH/EMBOLIZ  01/20/2020   IR US GUIDE VASC ACCESS RIGHT  12/23/2019   RADIOLOGY WITH ANESTHESIA N/A 01/19/2020   Procedure: IR WITH ANESTHESIA  EMBOLIZATION;  Surgeon: Julieanne Cotton, MD;  Location: [facility] OR;  Service: Radiology;  Laterality: N/A;    Social History:  Social History   Socioeconomic History   Marital status: Single    Spouse name: Not on file   Number of children: Not on file   Years of education: Not on file   Highest education level: Not on file  Occupational History   Not on file  Tobacco Use   Smoking status: Former    Pack years: 0.00   Smokeless tobacco: Never  Vaping Use   Vaping Use: Not on file  Substance and Sexual Activity   Alcohol use: Not Currently   Drug use: Not Currently   Sexual  activity: Not on file  Other Topics Concern   Not on file  Social History Narrative   Not on file   Social Determinants of Health   Financial Resource Strain: Not on file  Food Insecurity: Not on file  Transportation Needs: Not on file  Physical Activity: Not on file  Stress: Not on file  Social Connections: Not on file  Intimate Partner Violence: Not on file    Family History:  Family History  Problem Relation Age of Onset   Breast cancer Mother    Breast cancer Maternal Aunt    Stroke Father    Kidney disease Father     Medications:   Current Outpatient Medications on File Prior to Visit  Medication Sig Dispense Refill   amLODipine (NORVASC) 10 MG tablet Take 10 mg by mouth daily.     aspirin 325 MG tablet Take 1 tablet (325 mg total) by mouth daily. 30 tablet 3   carvedilol  (COREG) 25 MG tablet Take 1 tablet (25 mg total) by mouth 2 (two) times daily. 180 tablet 3   COVID-19 mRNA vaccine, Moderna, 100 MCG/0.5ML injection Inject into the muscle. 0.25 mL 0   ENTRESTO 97-103 MG TAKE 1 TABLET BY MOUTH TWICE A DAY 180 tablet 2   Multiple Vitamin (MULTIVITAMIN) tablet Take 1 tablet by mouth daily.     rosuvastatin (CRESTOR) 5 MG tablet Take 5 mg by mouth every Monday, Wednesday, and Friday.      No current facility-administered medications on file prior to visit.    Allergies:  No Known Allergies    OBJECTIVE:  Physical Exam  Vitals:   12/26/20 1239  BP: 133/88  Pulse: 66  Weight: 145 lb (65.8 kg)  Height: 5\' 4"  (1.626 m)   Body mass index is 24.89 kg/m. No results found.  General: well developed, well nourished,  pleasant middle-aged African-American female, seated, in no evident distress Head: head normocephalic and atraumatic.   Neck: supple with no carotid or supraclavicular bruits Cardiovascular: regular rate and rhythm, no murmurs Musculoskeletal: no deformity Skin:  no rash/petichiae Vascular:  Normal pulses all extremities   Neurologic Exam Mental Status: Awake and fully alert.   Fluent speech and language.  Oriented to place and time. Recent and remote memory intact. Attention span, concentration and fund of knowledge appropriate. Mood and affect appropriate.  Cranial Nerves: Pupils equal, briskly reactive to light. Extraocular movements full without nystagmus. Visual fields full to confrontation. Hearing intact. Facial sensation intact. Face, tongue, palate moves normally and symmetrically.  Motor: Normal bulk and tone. Normal strength in all tested extremity muscles. Sensory.: intact to touch , pinprick , position and vibratory sensation.  Coordination: Rapid alternating movements normal in all extremities. Finger-to-nose and heel-to-shin performed accurately bilaterally. Gait and Station: Arises from chair without difficulty. Stance is  normal. Gait demonstrates normal stride length and balance without use of assistive device Reflexes: 1+ and symmetric. Toes downgoing.         ASSESSMENT: Kendra Richardson is a 65 y.o. year old female presented with syncope/loss of consciousness with resultant blurred and double vision and headache on 10/26/2019 with stroke work-up revealing 2 punctate MCA cortical infarcts most likely secondary to large vessel disease source in setting of syncopal episode although cardioembolic source cannot be completely ruled out.  Vascular risk factors include intracranial stenosis, HTN, HLD, cardiomyopathy, CHF, L PCOM aneurysm 5 mm s/p coiling 12/2019 and former tobacco use.       PLAN:  Left MCA stroke:  Recovered well without residual deficits.  Continue aspirin 325 mg daily  and Crestor for secondary stroke prevention.  Discussed secondary stroke prevention measures and importance of close PCP follow-up for aggressive stroke risk factor management Cerebral aneurysm: s/p near complete obliteration with primary coiling 01/19/2020 by Dr. Corliss Skains. Repeat MRA head 07/2020 showed stable appearance of coiled aneurysm without residual filling. Plans on repeat imaging next month per Dr. Fatima Sanger recommendations HTN: BP goal<130/90.  Stable on Entresto, carvedilol and amlodipine per cardiology HLD: LDL goal<70.  On Crestor 5 mg MWF per PCP -plans on repeat lab work in September   Overall stable from stroke standpoint. Okay to consolidate care with PCP and cardiology for continued aggressive stroke risk factor management   CC:  [facility] provider: Dr. Carron Brazen, Darlen Round, MD    I spent 32 minutes of face-to-face and non-face-to-face time with patient.  This included previsit chart review, lab review, study review, electronic health record documentation, patient education and discussion regarding stroke as well as secondary stroke prevention measures and aggressive stroke risk factor management,  cerebral aneurysm procedure and ongoing surveillance monitoring with Dr. Corliss Skains and answered all questions to patient and sisters satisfaction   Ihor Austin, [facility]  [facility] [address] [place], [place] [address]  Phone [phone] Fax [phone] Note: This document was prepared with digital dictation and possible smart phrase technology. Any transcriptional errors that result from this process are unintentional.

## 2021-01-02 NOTE — Progress Notes (Signed)
I agree with the above plan 

## 2021-03-01 ENCOUNTER — Other Ambulatory Visit (HOSPITAL_COMMUNITY): Payer: Self-pay | Admitting: Interventional Radiology

## 2021-03-01 DIAGNOSIS — I671 Cerebral aneurysm, nonruptured: Secondary | ICD-10-CM

## 2021-03-13 ENCOUNTER — Ambulatory Visit (HOSPITAL_COMMUNITY)
Admission: RE | Admit: 2021-03-13 | Discharge: 2021-03-13 | Disposition: A | Discharge status: Home / Self Care | Payer: No Typology Code available for payment source | Source: Ambulatory Visit | Attending: Interventional Radiology | Admitting: Interventional Radiology

## 2021-03-13 ENCOUNTER — Other Ambulatory Visit: Payer: Self-pay

## 2021-03-13 DIAGNOSIS — I671 Cerebral aneurysm, nonruptured: Secondary | ICD-10-CM | POA: Insufficient documentation

## 2021-03-13 IMAGING — MR MR HEAD W/O CM
9 of 10 series · 38 of 48 positions shown · non-contrast
Comparison: [DATE]

CLINICAL DATA: Brain aneurysm, routine follow-up

EXAM:
MRI HEAD WITHOUT CONTRAST
MRA HEAD WITHOUT CONTRAST
TECHNIQUE: Multiplanar, multi-echo pulse sequences of the brain and surrounding
structures were acquired without intravenous contrast. Angiographic
images of the Circle of Willis were acquired using MRA technique
without intravenous contrast.

[Series 4: DWI · axial · 3.0mm · 1.09mm/px · z∈[-108,+44]mm · 11 of 106 slices shown (1 of 4)]
[im 1/106]
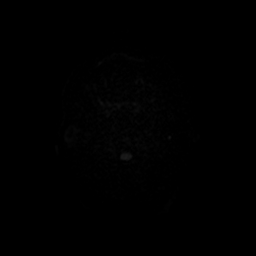
[im 11/106]
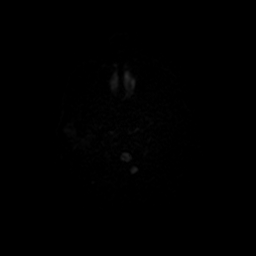
[im 22/106]
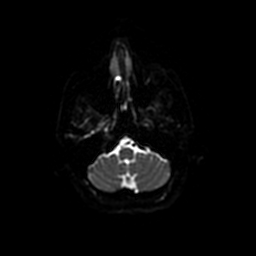
[im 32/106]
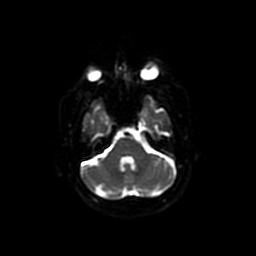
[im 43/106]
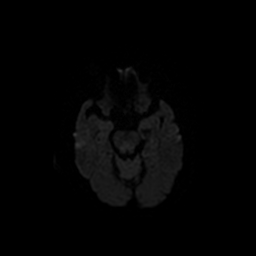
[im 53/106]
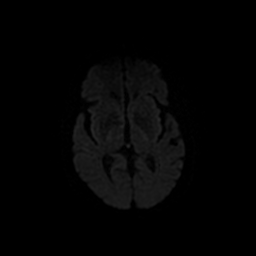
[im 64/106]
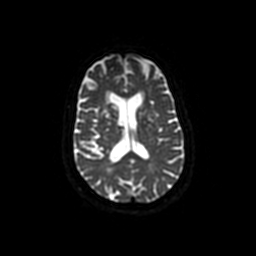
[im 74/106]
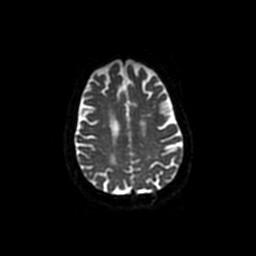
[im 85/106]
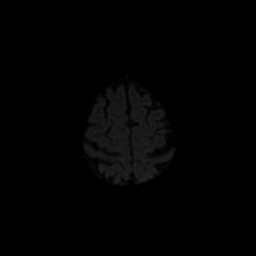
[im 95/106]
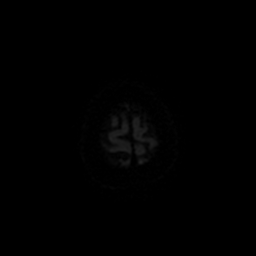
[im 106/106]
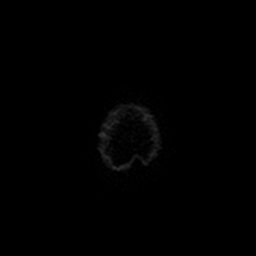

[Series 5: DWI · coronal · 5.0mm · 1.09mm/px · 8 of 78 slices shown (2 of 4)]
[im 1/78]
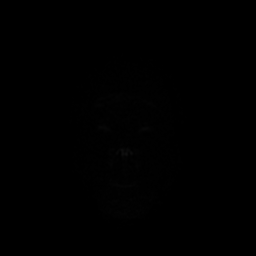
[im 12/78]
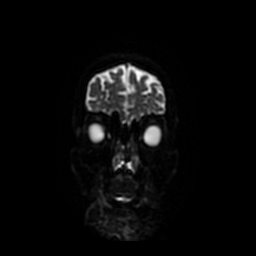
[im 23/78]
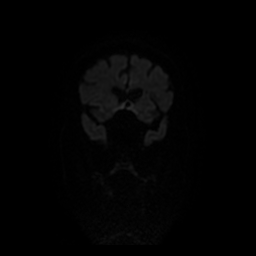
[im 34/78]
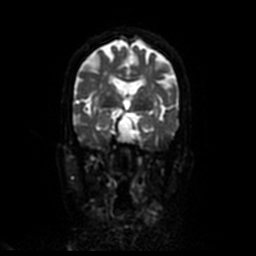
[im 45/78]
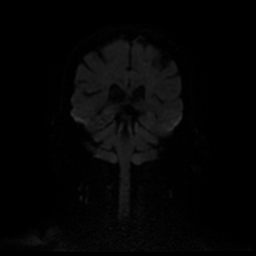
[im 56/78]
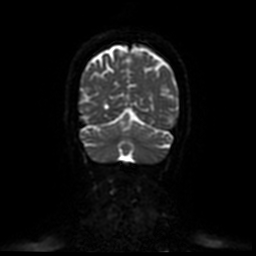
[im 67/78]
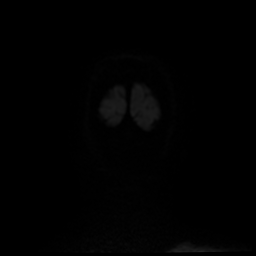
[im 78/78]
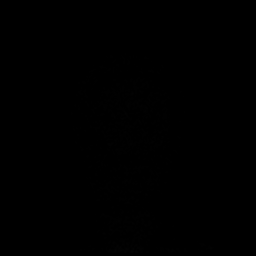

[Series 6: T1 · sagittal · 5.0mm · 0.47mm/px · 2 of 23 slices shown (1 of 2)]
[im 1/23]
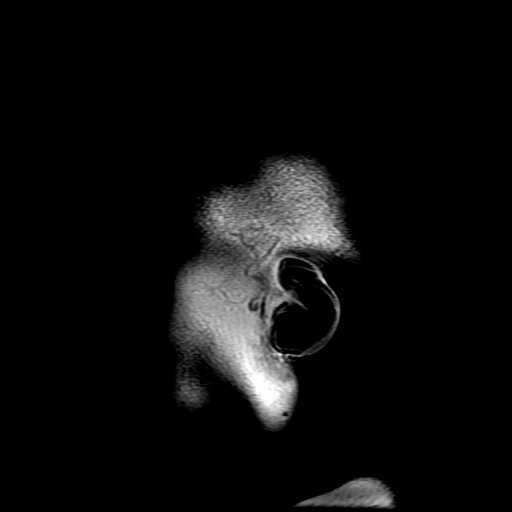
[im 23/23]
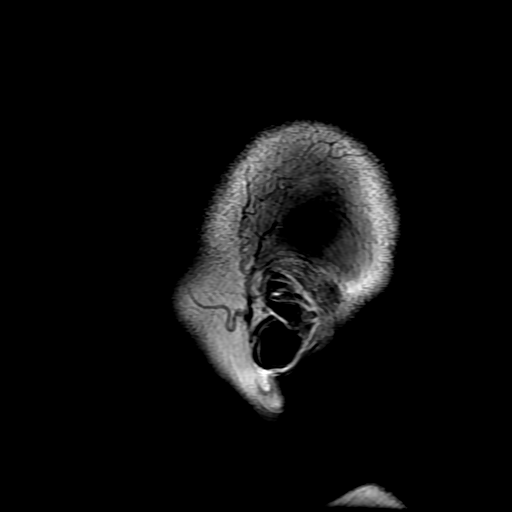

[Series 7: T2 · axial · 5.0mm · 0.43mm/px · z∈[-104,+41]mm · 2 of 26 slices shown (1 of 2)]
[im 1/26]
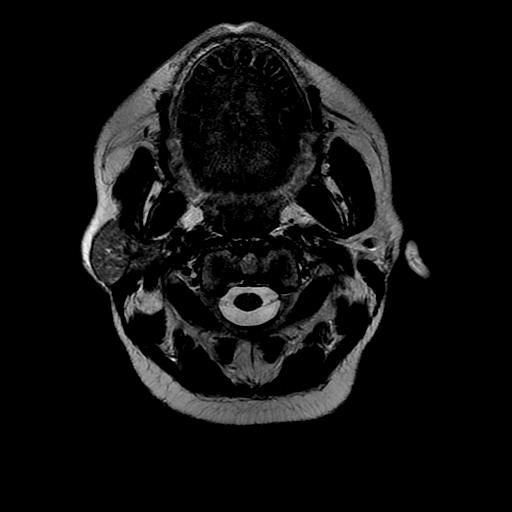
[im 26/26]
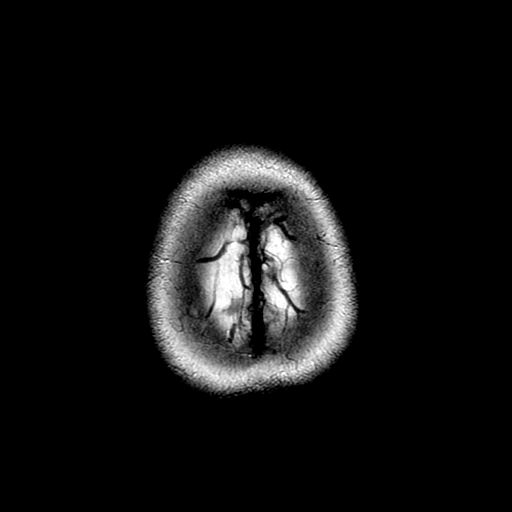

[Series 8: FLAIR · axial · 3.0mm · 0.43mm/px · z∈[-104,+41]mm · 2 of 26 slices shown]
[im 1/26]
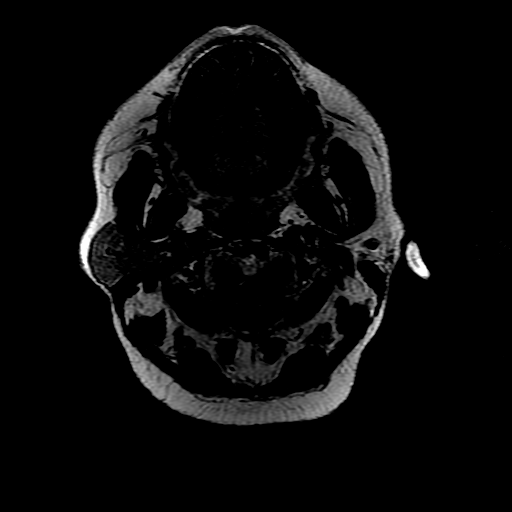
[im 26/26]
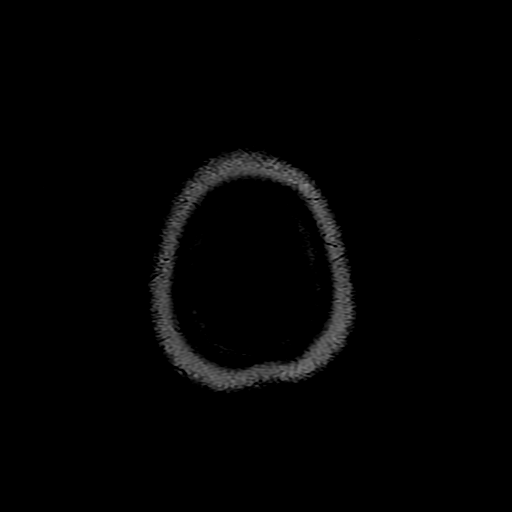

[Series 10: T1 · axial · 3.0mm · 0.43mm/px · z∈[-105,-89]mm · 2 of 104 slices shown (2 of 2)]
[im 1/104]
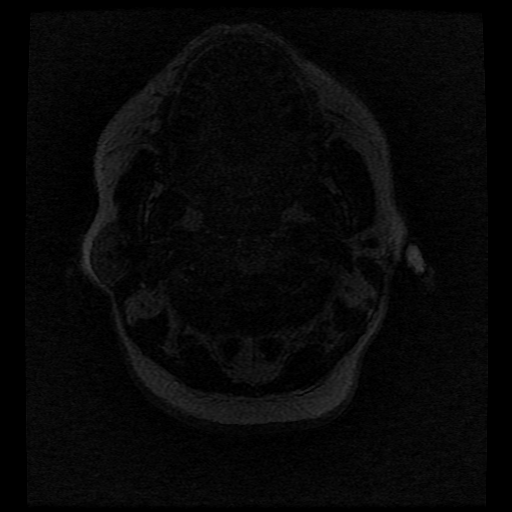
[im 12/104]
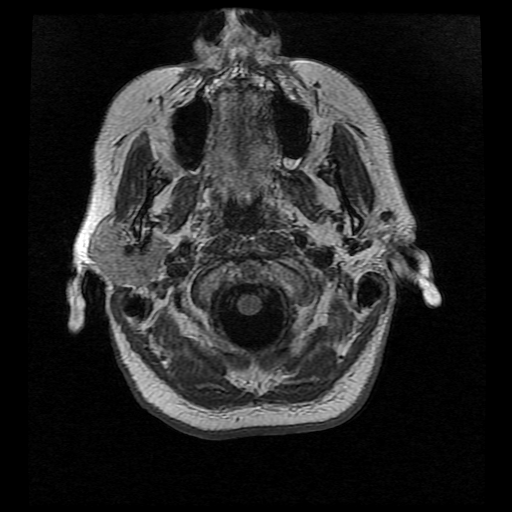

[Series 11: T2 · coronal · 5.0mm · 0.39mm/px · 2 of 24 slices shown (2 of 2)]
[im 1/24]
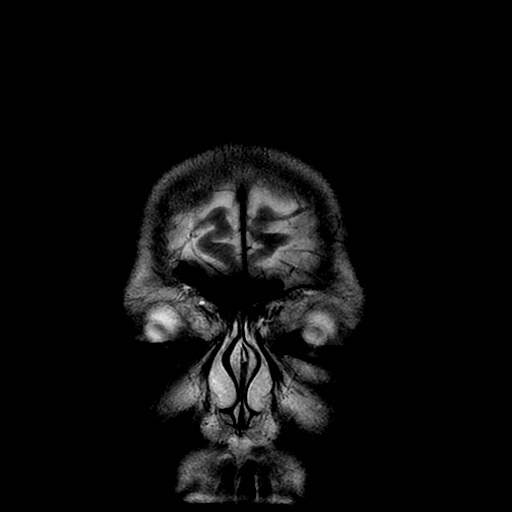
[im 24/24]
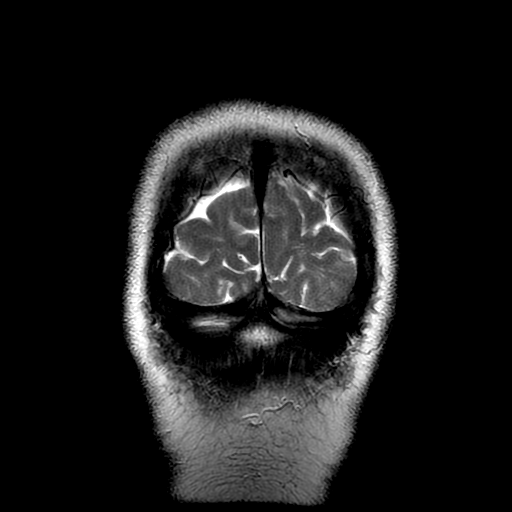

[Series 400: DWI · axial · 3.0mm · 1.09mm/px · z∈[-108,+44]mm · 5 of 53 slices shown (3 of 4)]
[im 1/53]
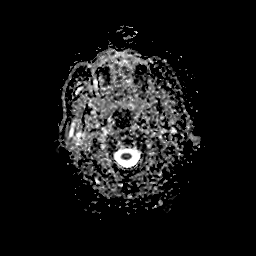
[im 14/53]
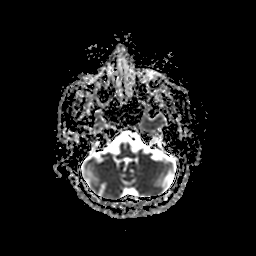
[im 27/53]
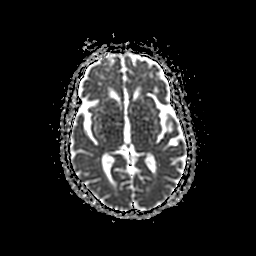
[im 40/53]
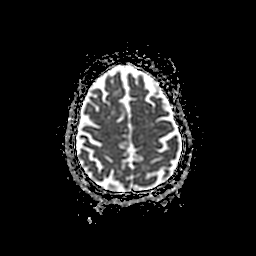
[im 53/53]
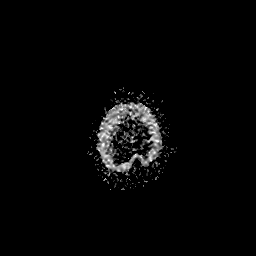

[Series 500: DWI · coronal · 5.0mm · 1.09mm/px · 4 of 38 slices shown (4 of 4)]
[im 1/38]
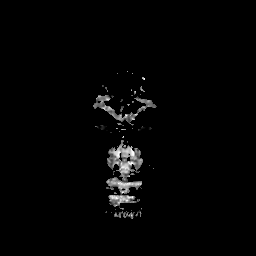
[im 13/38]
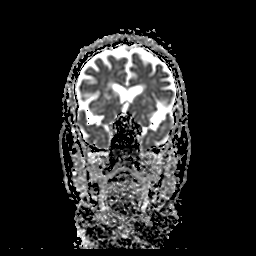
[im 25/38]
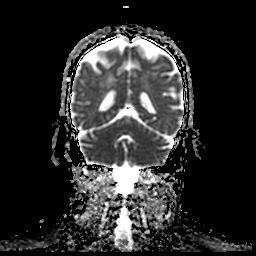
[im 38/38]
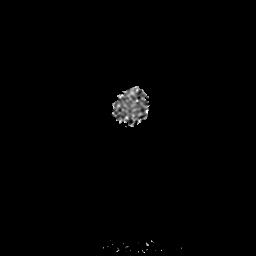

[38 of 48 positions shown; findings below may reference images not displayed]

FINDINGS: MRI HEAD FINDINGS

Brain: No acute infarction, hemorrhage, hydrocephalus, extra-axial
collection or mass lesion. Redemonstrated foci of hemosiderin
deposition, likely chronic microhemorrhage; no new foci. T2
hyperintense signal in the periventricular white matter, likely the
sequela of chronic small vessel ischemic disease. Mild cerebral
volume loss.

Vascular: Normal flow voids.

Skull and upper cervical spine: Normal marrow signal.

Sinuses/Orbits: No acute or significant finding.

Other: The mastoids are well aerated.

MRA HEAD FINDINGS

Anterior circulation: Redemonstrated coiled aneurysm of the left ICA
communicating segment, without residual filling, unchanged in
appearance. The left posterior communicating artery is present. The
right ICA is unremarkable. The ACAs and MCAs are normal.

Posterior circulation: Left dominant vertebral artery. The vertebral
arteries are patent to the vertebrobasilar junction. The basilar and
superior cerebellar arteries are unremarkable. Hypoplastic left P1,
with near fetal origin normal right PCA.

Anatomic variants: Near fetal origin of the left PCA. No right
posterior communicating artery is visualized.
IMPRESSION: 1. No acute intracranial abnormality.
2. Coiled aneurysm of the left ICA communicating segment without
residual filling.

## 2021-03-13 IMAGING — MR MR MRA HEAD W/O CM
1 series · 19 of 48 positions shown · non-contrast
Comparison: [DATE]

CLINICAL DATA: Brain aneurysm, routine follow-up

EXAM:
MRI HEAD WITHOUT CONTRAST
MRA HEAD WITHOUT CONTRAST
TECHNIQUE: Multiplanar, multi-echo pulse sequences of the brain and surrounding
structures were acquired without intravenous contrast. Angiographic
images of the Circle of Willis were acquired using MRA technique
without intravenous contrast.

[Series 3: (id) mt fs · axial · 1.4mm · 0.43mm/px · z∈[-98,-10]mm · 19 of 136 slices shown]
[im 1/136]
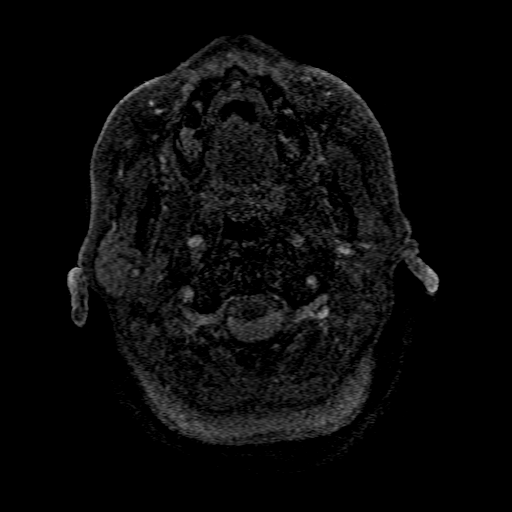
[im 3/136]
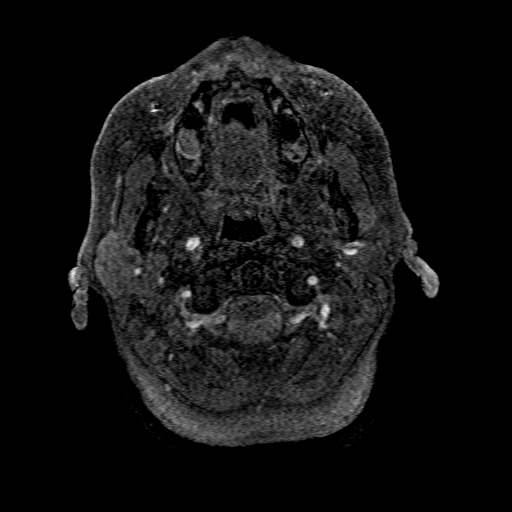
[im 6/136]
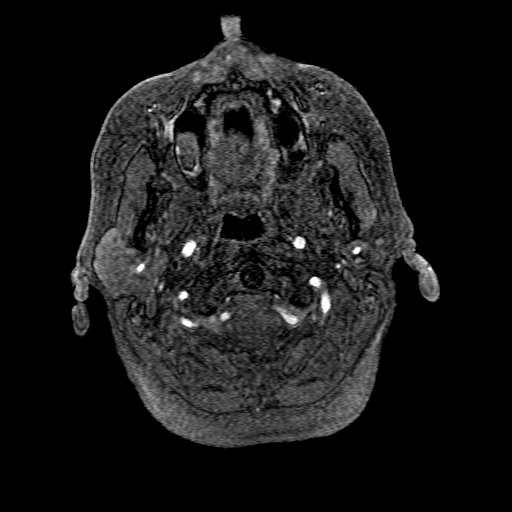
[im 9/136]
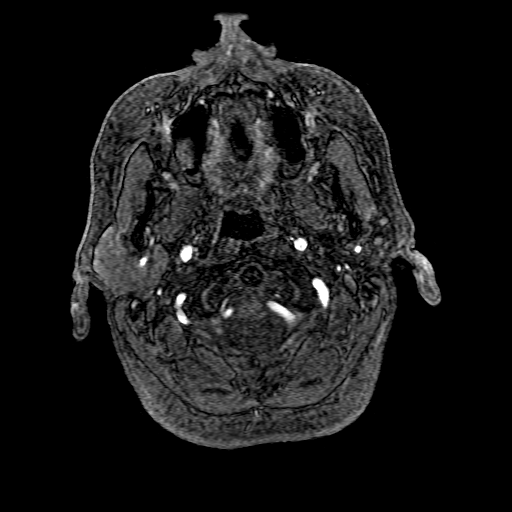
[im 12/136]
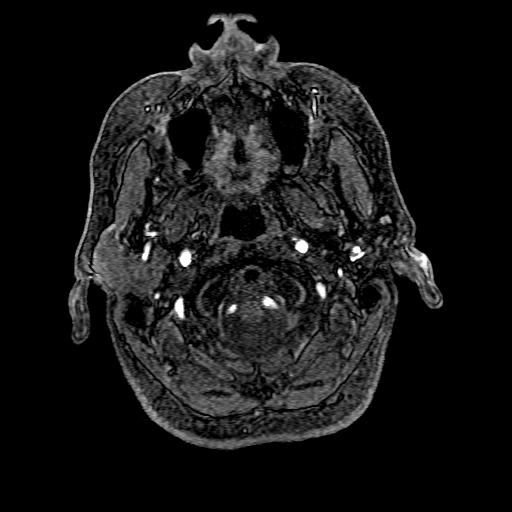
[im 15/136]
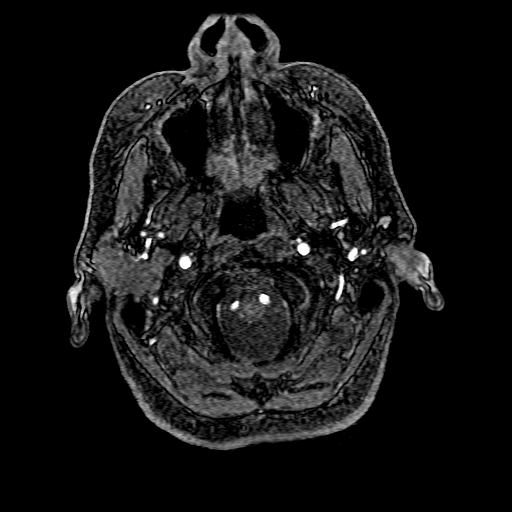
[im 18/136]
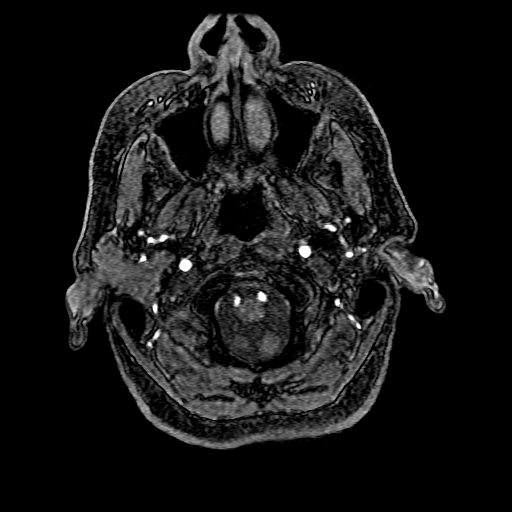
[im 21/136]
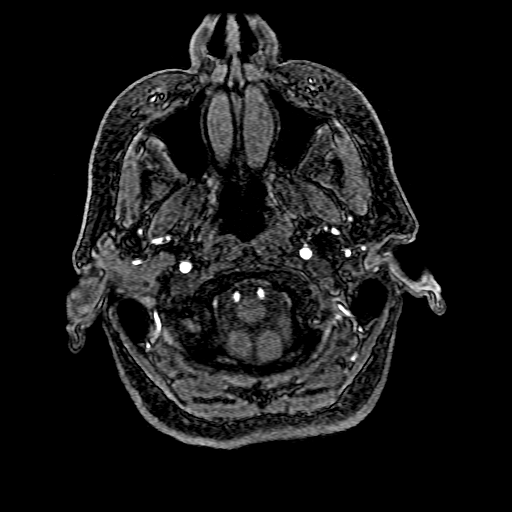
[im 23/136]
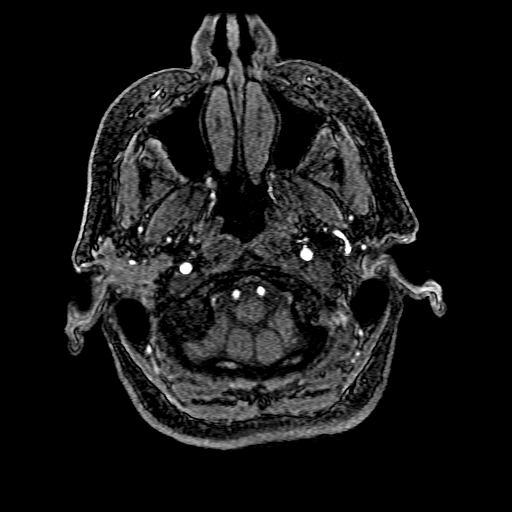
[im 26/136]
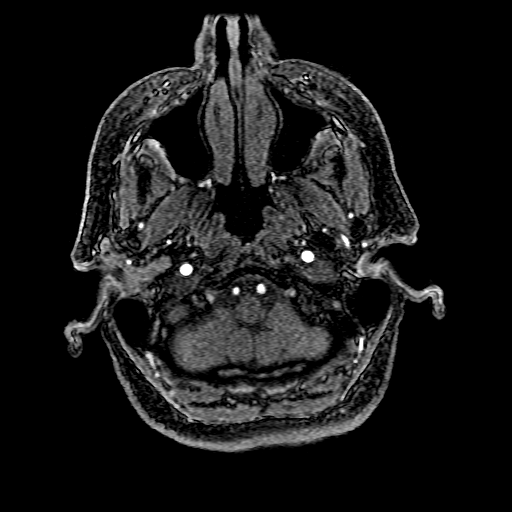
[im 29/136]
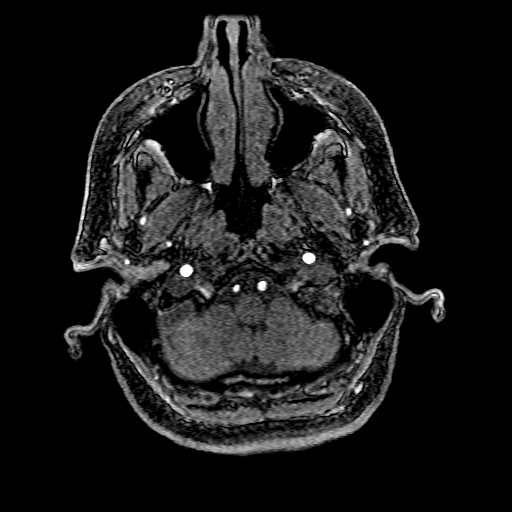
[im 44/136]
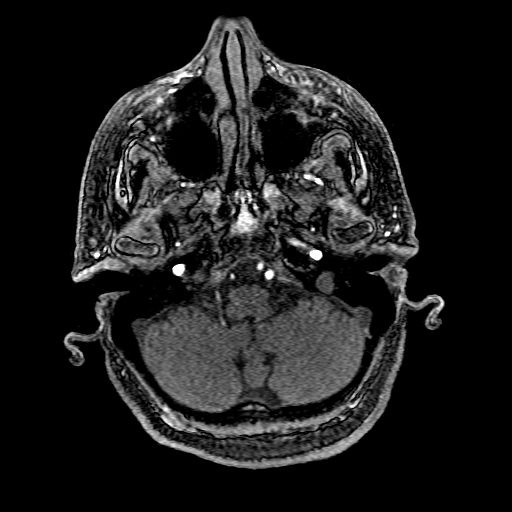
[im 61/136]
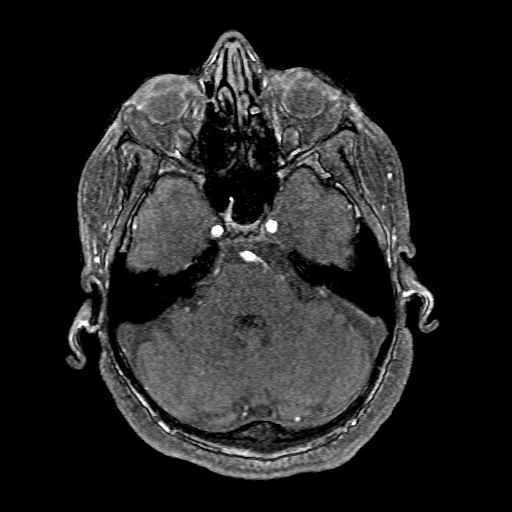
[im 69/136]
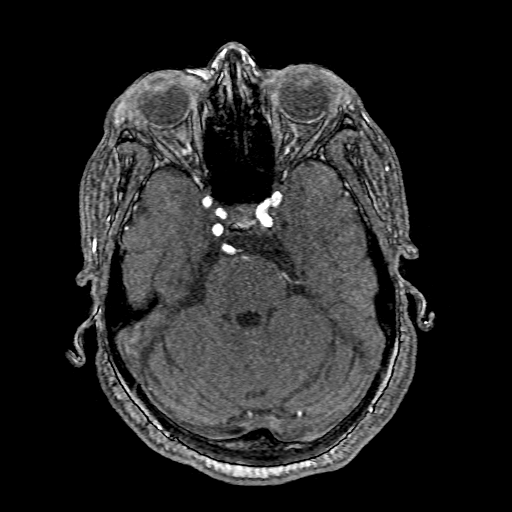
[im 78/136]
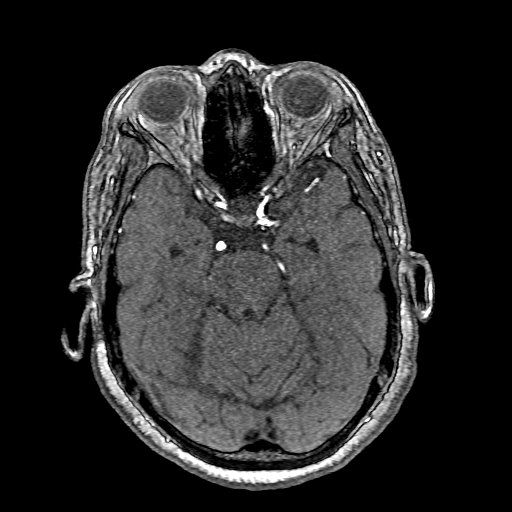
[im 95/136]
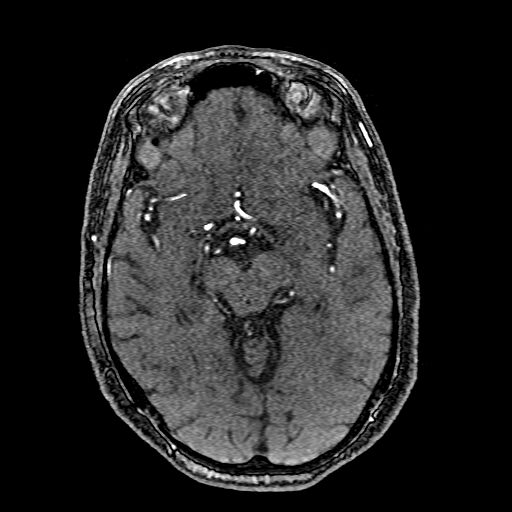
[im 113/136]
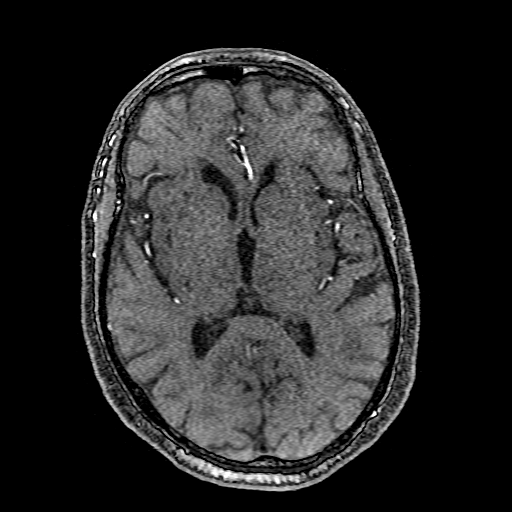
[im 115/136]
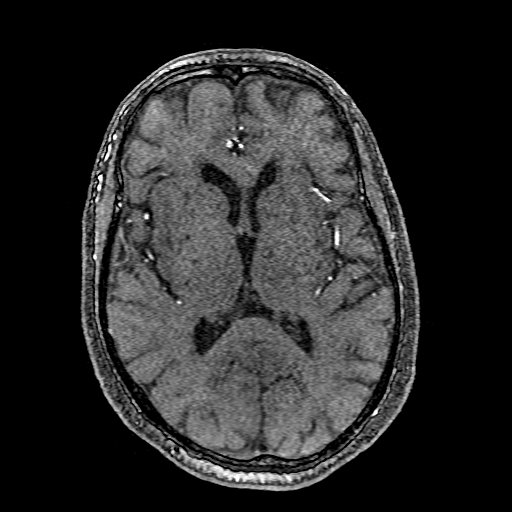
[im 130/136]
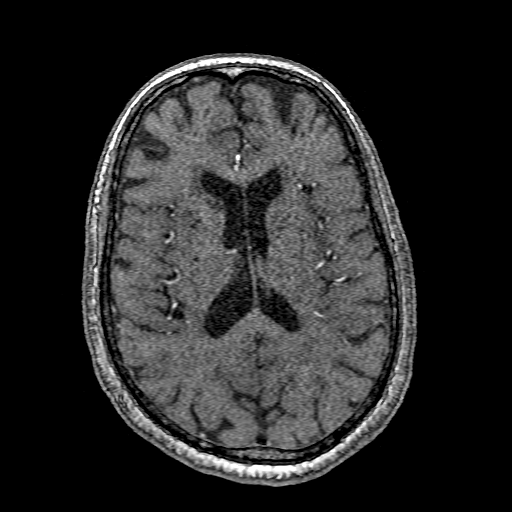

[19 of 48 positions shown; findings below may reference images not displayed]

FINDINGS: MRI HEAD FINDINGS

Brain: No acute infarction, hemorrhage, hydrocephalus, extra-axial
collection or mass lesion. Redemonstrated foci of hemosiderin
deposition, likely chronic microhemorrhage; no new foci. T2
hyperintense signal in the periventricular white matter, likely the
sequela of chronic small vessel ischemic disease. Mild cerebral
volume loss.

Vascular: Normal flow voids.

Skull and upper cervical spine: Normal marrow signal.

Sinuses/Orbits: No acute or significant finding.

Other: The mastoids are well aerated.

MRA HEAD FINDINGS

Anterior circulation: Redemonstrated coiled aneurysm of the left ICA
communicating segment, without residual filling, unchanged in
appearance. The left posterior communicating artery is present. The
right ICA is unremarkable. The ACAs and MCAs are normal.

Posterior circulation: Left dominant vertebral artery. The vertebral
arteries are patent to the vertebrobasilar junction. The basilar and
superior cerebellar arteries are unremarkable. Hypoplastic left P1,
with near fetal origin normal right PCA.

Anatomic variants: Near fetal origin of the left PCA. No right
posterior communicating artery is visualized.
IMPRESSION: 1. No acute intracranial abnormality.
2. Coiled aneurysm of the left ICA communicating segment without
residual filling.

## 2021-03-20 ENCOUNTER — Telehealth (HOSPITAL_COMMUNITY): Payer: Self-pay

## 2021-03-20 NOTE — Telephone Encounter (Signed)
Called pt regarding recent imaging, no answer, left vm. AW  

## 2021-03-21 ENCOUNTER — Telehealth (HOSPITAL_COMMUNITY): Payer: Self-pay

## 2021-03-21 NOTE — Telephone Encounter (Signed)
Pt agreed to f/u in 6 months with mra. AW  

## 2021-05-23 ENCOUNTER — Ambulatory Visit: Payer: No Typology Code available for payment source | Attending: Internal Medicine

## 2021-05-23 ENCOUNTER — Other Ambulatory Visit (HOSPITAL_BASED_OUTPATIENT_CLINIC_OR_DEPARTMENT_OTHER): Payer: Self-pay

## 2021-05-23 DIAGNOSIS — Z23 Encounter for immunization: Secondary | ICD-10-CM

## 2021-05-23 MED ORDER — MODERNA COVID-19 BIVAL BOOSTER 50 MCG/0.5ML IM SUSP
INTRAMUSCULAR | 0 refills | Status: DC
  Filled 2021-05-23: qty 0.5, 1d supply, fill #0

## 2021-05-23 NOTE — Progress Notes (Signed)
   Covid-19 Vaccination Clinic  Name:  Kendra Richardson    MRN: [id number] DOB: 08/13/1955  05/23/2021  Kendra Richardson was observed post Covid-19 immunization for 15 minutes without incident. She was provided with Vaccine Information Sheet and instruction to access the V-Safe system.   Kendra Richardson was instructed to call 911 with any severe reactions post vaccine: Difficulty breathing  Swelling of face and throat  A fast heartbeat  A bad rash all over body  Dizziness and weakness   Immunizations Administered     Name Date Dose VIS Date Route   Moderna Covid-19 vaccine Bivalent Booster 05/23/2021  2:15 PM 0.5 mL 02/03/2021 Intramuscular   Manufacturer: Moderna   Lot: [id number]   NDC: 61443-154-00

## 2021-06-04 ENCOUNTER — Telehealth: Payer: Self-pay | Admitting: Cardiology

## 2021-06-04 ENCOUNTER — Other Ambulatory Visit: Payer: Self-pay | Admitting: Cardiology

## 2021-06-04 NOTE — Telephone Encounter (Signed)
   Pt c/o medication issue:  1. Name of Medication: ENTRESTO 97-103 MG  2. How are you currently taking this medication (dosage and times per day)? TAKE 1 TABLET BY MOUTH TWICE A DAY  3. Are you having a reaction (difficulty breathing--STAT)?   4. What is your medication issue? Pt requesting if she can get another coupon for her entresto, her pharmacy sent request for refill today

## 2021-06-05 NOTE — Telephone Encounter (Signed)
Will send to see if any pt assistance is available for this patient.

## 2021-06-08 NOTE — Telephone Encounter (Signed)
Kendra Richardson is calling back in regards to this due to not hearing back.

## 2021-06-08 NOTE — Telephone Encounter (Signed)
I called pt and gave her the number to [employer]. She will call them and get them to send her an application.

## 2021-06-12 ENCOUNTER — Other Ambulatory Visit: Payer: Self-pay

## 2021-06-12 ENCOUNTER — Ambulatory Visit (INDEPENDENT_AMBULATORY_CARE_PROVIDER_SITE_OTHER): Payer: No Typology Code available for payment source | Admitting: Cardiology

## 2021-06-12 ENCOUNTER — Encounter: Payer: Self-pay | Admitting: Cardiology

## 2021-06-12 DIAGNOSIS — I10 Essential (primary) hypertension: Secondary | ICD-10-CM

## 2021-06-12 DIAGNOSIS — Z8673 Personal history of transient ischemic attack (TIA), and cerebral infarction without residual deficits: Secondary | ICD-10-CM | POA: Diagnosis not present

## 2021-06-12 DIAGNOSIS — I5020 Unspecified systolic (congestive) heart failure: Secondary | ICD-10-CM | POA: Diagnosis not present

## 2021-06-12 MED ORDER — ENTRESTO 97-103 MG PO TABS: 1.0000 | ORAL_TABLET | Freq: Two times a day (BID) | ORAL | 3 refills | Status: DC

## 2021-06-12 NOTE — Assessment & Plan Note (Signed)
Has been difficult to control.  Reasonable control at this point.  Continue with multidrug regimen.  Medications reviewed.  Continue with current medical management.

## 2021-06-12 NOTE — Patient Instructions (Signed)
Medication Instructions:  The current medical regimen is effective;  continue present plan and medications.  *If you need a refill on your cardiac medications before your next appointment, please call your pharmacy*  Follow-Up: At [facility], you and your health needs are our priority.  As part of our continuing mission to provide you with exceptional heart care, we have created designated Provider Care Teams.  These Care Teams include your primary Cardiologist (physician) and Advanced Practice Providers (APPs -  Physician Assistants and Nurse Practitioners) who all work together to provide you with the care you need, when you need it.  We recommend signing up for the patient portal called "MyChart".  Sign up information is provided on this After Visit Summary.  MyChart is used to connect with patients for Virtual Visits (Telemedicine).  Patients are able to view lab/test results, encounter notes, upcoming appointments, etc.  Non-urgent messages can be sent to your provider as well.   To learn more about what you can do with MyChart, go to [email or web address].com.    Your next appointment:   6 month(s)  The format for your next appointment:   In Person  Provider:   Dayna Dunn, PA-C, Laura Ingold, NP, Michele Lenze, PA-C, Michelle Swinyer, NP, or Scott Weaver, PA-C     Then, Mark Skains, MD will plan to see you again in 1 year(s).    Thank you for choosing [facility]!!     

## 2021-06-12 NOTE — Progress Notes (Signed)
Cardiology Office Note:    Date:  06/12/2021   ID:  Kendra Richardson, Kendra Richardson 02/25/1956, MRN [id number]  PCP:  Lawerance Cruel, MD  [facility] Cardiologist:  Candee Furbish, MD  [facility] Electrophysiologist:  None   Referring MD: Lawerance Cruel, MD   History of Present Illness:    Kendra Richardson is a 65 y.o. female here for the follow up of congestive heart failure and hypertension.  Difficult to control hypertension, systolic heart failure.  Has been seen by the pharmacy lead hypertension/heart failure clinic here.  At her last appointment she had overall been quite well.  Felt good NYHA class I.  Today: Overall, she appears well. She denies any shortness of breath or chest pains. Since her prior stroke she has not had any more lightheadedness/dizziness.  She remains compliant with Entresto twice daily, but she is in need of a refill. Unfortunately she is waiting on the completion of patient assistance forms which she has been told may take months.  She denies any palpitations, headaches, syncope, orthopnea, PND, lower extremity edema or exertional symptoms.  Past Medical History:  Diagnosis Date   Abnormal radiographic examination    CHF (congestive heart failure) (HCC)    Chronic kidney disease, stage III (moderate) (HCC)    Chronic kidney disease, stage III (moderate) (HCC)    CN (constipation)    Complication of anesthesia    Essential hypertension, malignant    HTN (hypertension)    LVH (left ventricular hypertrophy)    PONV (postoperative nausea and vomiting)    Protein-calorie malnutrition (HCC)     Past Surgical History:  Procedure Laterality Date   BREAST EXCISIONAL BIOPSY Left    IR 3D INDEPENDENT WKST  12/23/2019   IR ANGIO INTRA EXTRACRAN SEL COM CAROTID INNOMINATE BILAT MOD SED  12/23/2019   IR ANGIO INTRA EXTRACRAN SEL INTERNAL CAROTID UNI L MOD SED  01/20/2020   IR ANGIO VERTEBRAL SEL SUBCLAVIAN INNOMINATE UNI L MOD SED   12/23/2019   IR ANGIO VERTEBRAL SEL VERTEBRAL UNI R MOD SED  12/23/2019   IR ANGIOGRAM FOLLOW UP STUDY  01/20/2020   IR ANGIOGRAM FOLLOW UP STUDY  01/20/2020   IR ANGIOGRAM FOLLOW UP STUDY  01/20/2020   IR TRANSCATH/EMBOLIZ  01/20/2020   IR US GUIDE VASC ACCESS RIGHT  12/23/2019   RADIOLOGY WITH ANESTHESIA N/A 01/19/2020   Procedure: IR WITH ANESTHESIA  EMBOLIZATION;  Surgeon: Luanne Bras, MD;  Location: [place];  Service: Radiology;  Laterality: N/A;    Current Medications: Current Meds  Medication Sig   amLODipine (NORVASC) 10 MG tablet Take 10 mg by mouth daily.   aspirin 325 MG tablet Take 1 tablet (325 mg total) by mouth daily.   carvedilol (COREG) 25 MG tablet Take 1 tablet (25 mg total) by mouth 2 (two) times daily.   Multiple Vitamin (MULTIVITAMIN) tablet Take 1 tablet by mouth daily.   rosuvastatin (CRESTOR) 5 MG tablet Take 5 mg by mouth every Monday, Wednesday, and Friday.    [DISCONTINUED] sacubitril-valsartan (ENTRESTO) 97-103 MG Take 1 tablet by mouth 2 (two) times daily. Please schedule yearly appointment for future refills. Thank you     Allergies:   Patient has no known allergies.   Social History   Socioeconomic History   Marital status: Single    Spouse name: Not on file   Number of children: Not on file   Years of education: Not on file   Highest education level: Not on file  Occupational History   Not on file  Tobacco Use   Smoking status: Former   Smokeless tobacco: Never  Vaping Use   Vaping Use: Not on file  Substance and Sexual Activity   Alcohol use: Not Currently   Drug use: Not Currently   Sexual activity: Not on file  Other Topics Concern   Not on file  Social History Narrative   Not on file   Social Determinants of Health   Financial Resource Strain: Not on file  Food Insecurity: Not on file  Transportation Needs: Not on file  Physical Activity: Not on file  Stress: Not on file  Social Connections: Not on file     Family History: The  patient's family history includes Breast cancer in her maternal aunt and mother; Kidney disease in her father; Stroke in her father.  ROS:   Please see the history of present illness.    All other systems reviewed and are negative.  EKGs/Labs/Other Studies Reviewed:    The following studies were reviewed today:  EKG:  EKG is personally reviewed and interpreted. 06/12/2021: Sinus rhythm. Rate 65 bpm. Nonspecific T wave changes. First degree AV block, 240 ms.  MR-A Head 03/13/2021: COMPARISON:  08/02/2020   FINDINGS: MRI HEAD FINDINGS   Brain: No acute infarction, hemorrhage, hydrocephalus, extra-axial collection or mass lesion. Redemonstrated foci of hemosiderin deposition, likely chronic microhemorrhage; no new foci. T2 hyperintense signal in the periventricular white matter, likely the sequela of chronic small vessel ischemic disease. Mild cerebral volume loss.   Vascular: Normal flow voids.   Skull and upper cervical spine: Normal marrow signal.   Sinuses/Orbits: No acute or significant finding.   Other: The mastoids are well aerated.   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 The left posterior communicating artery is present. The right ICA is unremarkable. The ACAs and MCAs are normal.   Posterior circulation: Left dominant vertebral artery. The vertebral arteries are patent to the vertebrobasilar junction. The basilar and superior cerebellar arteries are unremarkable. Hypoplastic left P1, with near fetal origin normal right PCA.   Anatomic variants: Near fetal origin of the left PCA. No right posterior communicating artery is visualized.   IMPRESSION: 1. No acute intracranial abnormality. 2. 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  Event monitor 12/09/19: Sinus rhythm average HR 70 BPM No atrial  fibrillation, no pauses Two brief episodes of non sustained ventricular tachycardia, 4 beats and 10 beats, both asymptomatic Rare PAC, PVC   Recent ECHO EF low normal 50-55% Continue with coreg, beta blocker. No changes.  Donato Schultz, MD   ECHO 10/27/19:  1. Left ventricular ejection fraction, by estimation, is 50 to 55%. The  left ventricle has low normal function. The left ventricle has no regional  wall motion abnormalities. Left ventricular diastolic parameters are  consistent with Grade I diastolic  dysfunction (impaired relaxation).   2. Right ventricular systolic function is normal. The right ventricular  size is normal. There is normal pulmonary artery systolic pressure.   3. The mitral valve is normal in structure. No evidence of mitral valve  regurgitation. No evidence of mitral stenosis.   4. The aortic valve is normal in structure. Aortic valve regurgitation is  not visualized. No aortic stenosis is present.   5. The inferior vena cava is normal in size with greater than 50%  respiratory variability, suggesting right atrial pressure of  3 mmHg.   Bilateral Renal Artery Doppler 07/23/2019: Summary:  Renal:     Right: No evidence of right renal artery stenosis. RRV flow present.         Normal size right kidney. Normal right Resisitive Index.         Normal cortical thickness of right kidney.  Left:  No evidence of left renal artery stenosis. LRV flow present.         Abnormal size for the left kidney. Normal left Resistive         Index. Normal cortical thickness of the left kidney.  Mesenteric:  Normal Celiac artery and Superior Mesenteric artery findings. Areas of  limited visceral study include left kidney size.   Echocardiogram 07/22/2019:  1. Left ventricular ejection fraction, by visual estimation, is 30 to  35%. The left ventricle has moderate to severely decreased function. There  is no left ventricular hypertrophy.   2. Left ventricular diastolic parameters  are consistent with Grade I  diastolic dysfunction (impaired relaxation).   3. The left ventricle demonstrates global hypokinesis.   4. Global right ventricle has normal systolic function.The right  ventricular size is normal.   5. Left atrial size was mildly dilated.   6. Right atrial size was normal.   7. Mild mitral annular calcification.   8. The mitral valve is normal in structure. Trivial mitral valve  regurgitation. No evidence of mitral stenosis.   9. The tricuspid valve is normal in structure. Tricuspid valve  regurgitation is trivial.  10. The aortic valve is tricuspid. Aortic valve regurgitation is not  visualized. Mild aortic valve sclerosis without stenosis.  11. The pulmonic valve was normal in structure. Pulmonic valve  regurgitation is not visualized.  12. The inferior vena cava is normal in size with greater than 50%  respiratory variability, suggesting right atrial pressure of 3 mmHg.  13. Moderate to severe global reduction in LV systolic function; grade 1  diastolic dysfunction; mild LAE.   Recent Labs: No results found for requested labs within last 8760 hours.   Recent Lipid Panel    Component Value Date/Time   CHOL 207 (H) 10/27/2019 1544   TRIG 148 10/27/2019 1544   HDL 76 10/27/2019 1544   CHOLHDL 2.7 10/27/2019 1544   VLDL 30 10/27/2019 1544   LDLCALC 101 (H) 10/27/2019 1544     Risk Assessment/Calculations:      Physical Exam:    VS:  BP 138/90    Pulse 65    Ht 5\' 5"  (1.651 m)    Wt 145 lb 3.2 oz (65.9 kg)    SpO2 98%    BMI 24.16 kg/m     Wt Readings from Last 3 Encounters:  06/12/21 145 lb 3.2 oz (65.9 kg)  12/26/20 145 lb (65.8 kg)  07/07/20 139 lb (63 kg)     GEN:  Well nourished, well developed in no acute distress HEENT: Normal NECK: No JVD; No carotid bruits LYMPHATICS: No lymphadenopathy CARDIAC: RRR, no murmurs, rubs, gallops RESPIRATORY:  Clear to auscultation without rales, wheezing or rhonchi  ABDOMEN: Soft, non-tender,  non-distended MUSCULOSKELETAL:  No edema; No deformity  SKIN: Warm and dry NEUROLOGIC:  Alert and oriented x 3 PSYCHIATRIC:  Normal affect   ASSESSMENT:    1. HFrEF (heart failure with reduced ejection fraction) ([place])   2. Essential hypertension   3. H/O ischemic left MCA stroke     PLAN:    In order of problems listed above:  HFrEF (heart  failure with reduced ejection fraction) (HCC) EF was 30% but now 50%. Continuing with Entresto carvedilol amlodipine for blood pressure as well.  NYHA class I symptoms.  Doing well.  Patient assistance form for Entresto.  Too expensive for her.  Essential hypertension Has been difficult to control.  Reasonable control at this point.  Continue with multidrug regimen.  Medications reviewed.  Continue with current medical management.  H/O ischemic left MCA stroke Neurology note reviewed.  Requested use of aspirin 325.  No bleeding issues.  Hemoglobin 10.8 on recent labs.  Creatinine 1.3 ALT 17   58-month follow-up with APP.    Medication Adjustments/Labs and Tests Ordered: Current medicines are reviewed at length with the patient today.  Concerns regarding medicines are outlined above.   Orders Placed This Encounter  Procedures   EKG 12-Lead   Meds ordered this encounter  Medications   sacubitril-valsartan (ENTRESTO) 97-103 MG    Sig: Take 1 tablet by mouth 2 (two) times daily.    Dispense:  180 tablet    Refill:  3    Please Honor Card patient is presenting for Carmie Kanner: [id number]; Juanna CaoFX:[id number]; [id number]: [facility]; [id number]   Patient Instructions  Medication Instructions:  The current medical regimen is effective;  continue present plan and medications.  *If you need a refill on your cardiac medications before your next appointment, please call your pharmacy*  Follow-Up: At [facility], you and your health needs are our priority.  As part of our continuing mission to provide you with exceptional heart care, we have  created designated Provider Care Teams.  These Care Teams include your primary Cardiologist (physician) and Advanced Practice Providers (APPs -  Physician Assistants and Nurse Practitioners) who all work together to provide you with the care you need, when you need it.  We recommend signing up for the patient portal called "MyChart".  Sign up information is provided on this After Visit Summary.  MyChart is used to connect with patients for Virtual Visits (Telemedicine).  Patients are able to view lab/test results, encounter notes, upcoming appointments, etc.  Non-urgent messages can be sent to your provider as well.   To learn more about what you can do with MyChart, go to [email or web address].    Your next appointment:   6 month(s)  The format for your next appointment:   In Person  Provider:   Melina Copa, PA-C, Cecilie Kicks, NP, Ermalinda Barrios, PA-C, Christen Bame, NP, or Richardson Dopp, PA-C     Then, Candee Furbish, MD will plan to see you again in 1 year(s).    Thank you for choosing [facility]!!      I,Mathew Stumpf,acting as a scribe for Candee Furbish, MD.,have documented all relevant documentation on the behalf of Candee Furbish, MD,as directed by  Candee Furbish, MD while in the presence of Candee Furbish, MD.  I, Candee Furbish, MD, have reviewed all documentation for this visit. The documentation on 06/12/21 for the exam, diagnosis, procedures, and orders are all accurate and complete.   Signed, Candee Furbish, MD  06/12/2021 9:23 AM    [facility]

## 2021-06-12 NOTE — Assessment & Plan Note (Signed)
Neurology note reviewed.  Requested use of aspirin 325.  No bleeding issues.  Hemoglobin 10.8 on recent labs.  Creatinine 1.3 ALT 17

## 2021-06-12 NOTE — Assessment & Plan Note (Signed)
EF was 30% but now 50%. Continuing with Entresto carvedilol amlodipine for blood pressure as well.  NYHA class I symptoms.  Doing well.  Patient assistance form for Entresto.  Too expensive for her.

## 2021-07-02 ENCOUNTER — Telehealth: Payer: Self-pay

## 2021-07-02 MED ORDER — ENTRESTO 97-103 MG PO TABS: 1.0000 | ORAL_TABLET | Freq: Two times a day (BID) | ORAL | 3 refills | Status: DC

## 2021-07-02 NOTE — Telephone Encounter (Signed)
**Note De-Identified Kyndall Chaplin Obfuscation** The pt left her completed [employer] Pt as[employer] application at the office with a request for [place] to  1. Complete the providers page, print a Entresto 97-103 mg #180 with 3 refills, have Dr Anne Fu sign it and the application, and to fax all to [employer].  2. Leave her application (including the signed providers page) in the front office for the pt to pickup.

## 2021-07-02 NOTE — Telephone Encounter (Signed)
Received email from Lyn with pts application, income information etc.  Will have Dr Anne Fu to sign application and rx for [employer]

## 2021-07-03 NOTE — Telephone Encounter (Signed)
Application and rx signed by Dr Marlou Porch.  Faxed t o# on cover sheet.

## 2021-07-03 NOTE — Telephone Encounter (Signed)
Received successful fax notification.

## 2021-07-04 NOTE — Telephone Encounter (Signed)
**Note De-Identified [name] Obfuscation** Letter received Kendra Richardson fax from [employer] [facility] [employer] stating that they need the pts proof of income and her household size.  I called the pt to make her aware and she states that she is calling [employer] today to answer these questions as she states that she sent statements of her earnings with her application as advised by [employer] because she does not have a copy of her 1040.  She is aware to call [name] back at [phone] if she needs anything from [place] concerning her application. She thanked me for calling her with update.

## 2021-07-04 NOTE — Telephone Encounter (Signed)
**Note De-Identified Kendra Richardson Obfuscation** I called the pt and advised her that the providers page of her [employer] pt asst application has been left in the front at Dr Anne Fu office at [facility] for her to pick up. She verbalized understanding and thanked me for my call.

## 2021-08-22 ENCOUNTER — Encounter: Payer: Self-pay | Admitting: Pharmacist

## 2021-08-22 ENCOUNTER — Telehealth: Payer: Self-pay | Admitting: Pharmacist

## 2021-08-22 NOTE — Telephone Encounter (Signed)
Pt called clinic and left voicemail for Melissa asking about an Entresto coupon. Recent cards messages show pt called asking about a copay card near the end of last year but was provided with pt assistance application instead. She has Nurse, learning disability and can just use the $10/month copay card. ? ?I have activated a card for her and called it into her pharmacy. ?BIN: [id number] ?PCN: [id number] ?GRP: [id number] ?ID: [id number] ? ?They can't process rx until 3/11 as it's currently too early to fill but they state the last time pt picked up rx in December she only paid $10 which sounds like she was using the copay card already. Spoke with pt, she thought she'd need to renew her copay card. Advised her it should be $10 the next time she has it filled but the website did let me activate a new card so I'll send this to her via MyChart so she has it in case updated card info is needed at time of next fill. ?

## 2021-08-31 ENCOUNTER — Other Ambulatory Visit: Payer: Self-pay | Admitting: Family Medicine

## 2021-08-31 DIAGNOSIS — Z1231 Encounter for screening mammogram for malignant neoplasm of breast: Secondary | ICD-10-CM

## 2021-09-04 ENCOUNTER — Ambulatory Visit
Admission: RE | Admit: 2021-09-04 | Discharge: 2021-09-04 | Disposition: A | Discharge status: Home / Self Care | Payer: No Typology Code available for payment source | Source: Ambulatory Visit | Attending: Family Medicine | Admitting: Family Medicine

## 2021-09-04 DIAGNOSIS — Z1231 Encounter for screening mammogram for malignant neoplasm of breast: Secondary | ICD-10-CM

## 2021-09-04 IMAGING — MG MM DIGITAL SCREENING BILAT W/ TOMO AND CAD
8 series · 8 of 24 positions shown · non-contrast
Comparison: Previous exam(s).

CLINICAL DATA: Screening.

EXAM:
DIGITAL SCREENING BILATERAL MAMMOGRAM WITH TOMOSYNTHESIS AND CAD
TECHNIQUE: Bilateral screening digital craniocaudal and mediolateral oblique
mammograms were obtained. Bilateral screening digital breast
tomosynthesis was performed. The images were evaluated with
computer-aided detection.

[L CC synth-2D]
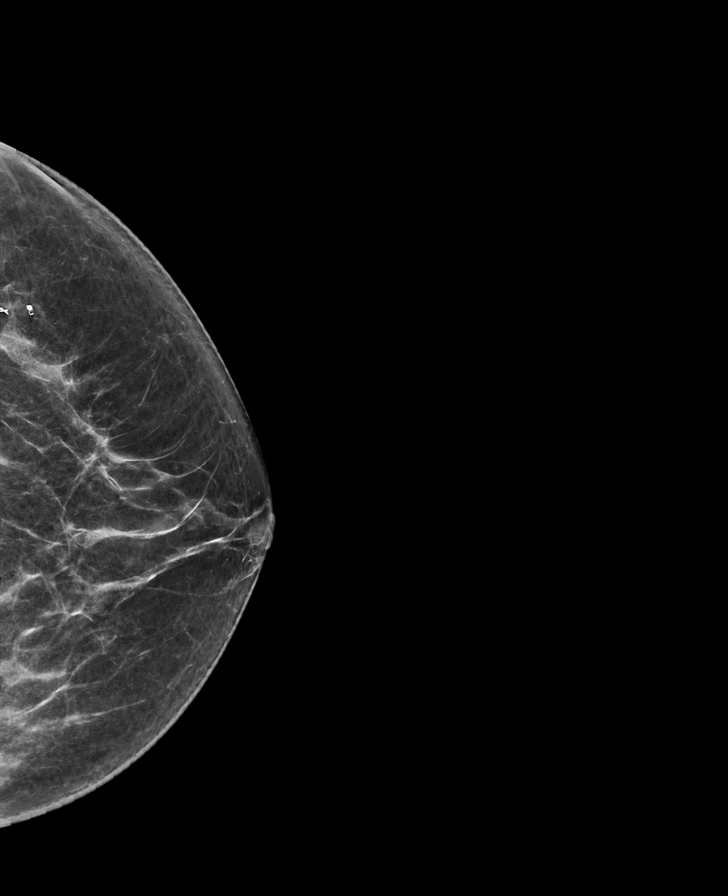

[R MLO synth-2D]
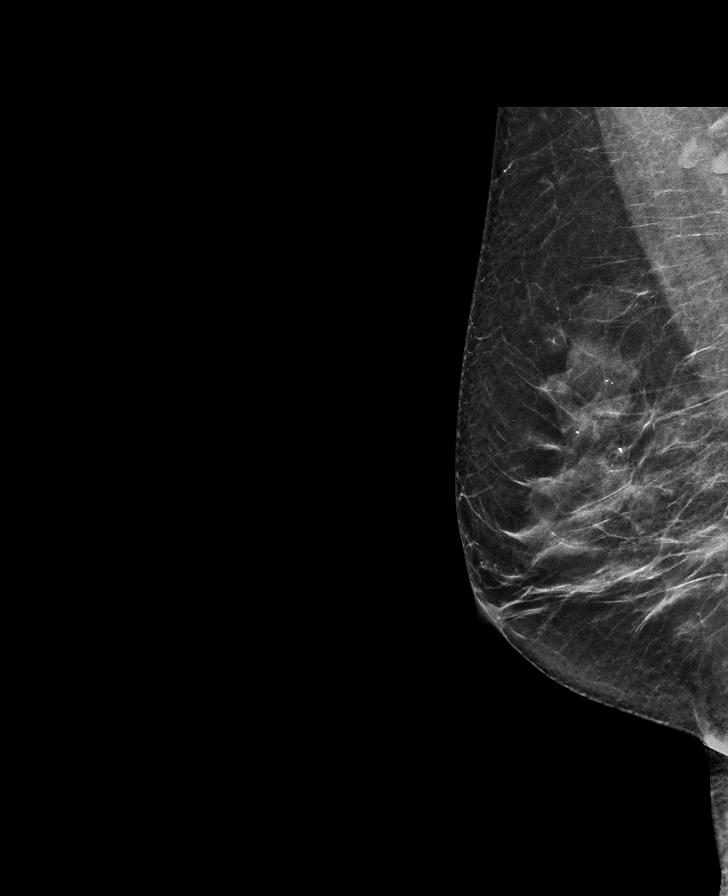

[L MLO synth-2D]
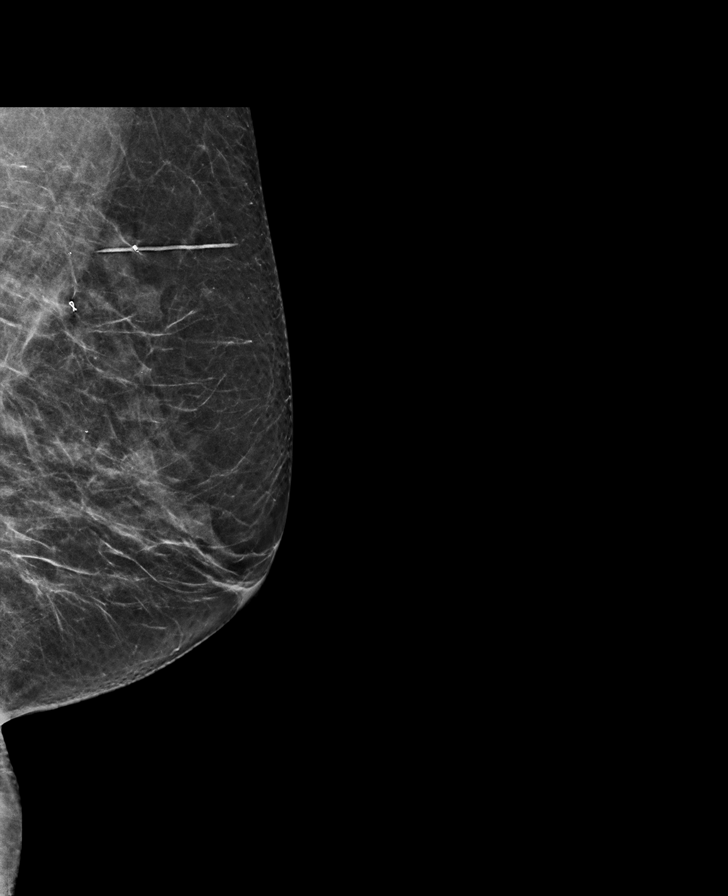

[R CC synth-2D]
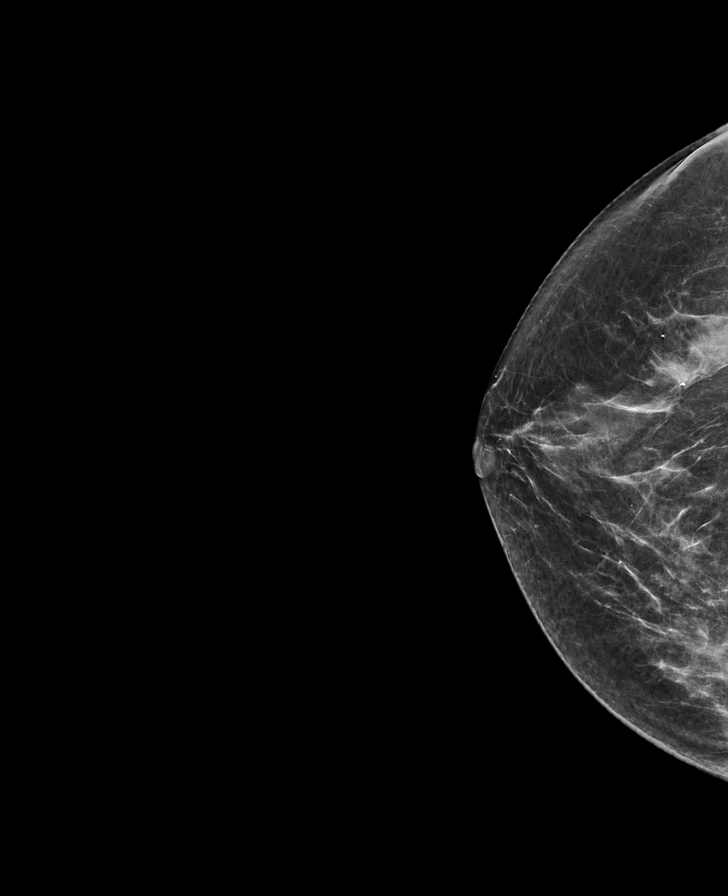

[L MLO tomo · tomo slice 32/63.0]
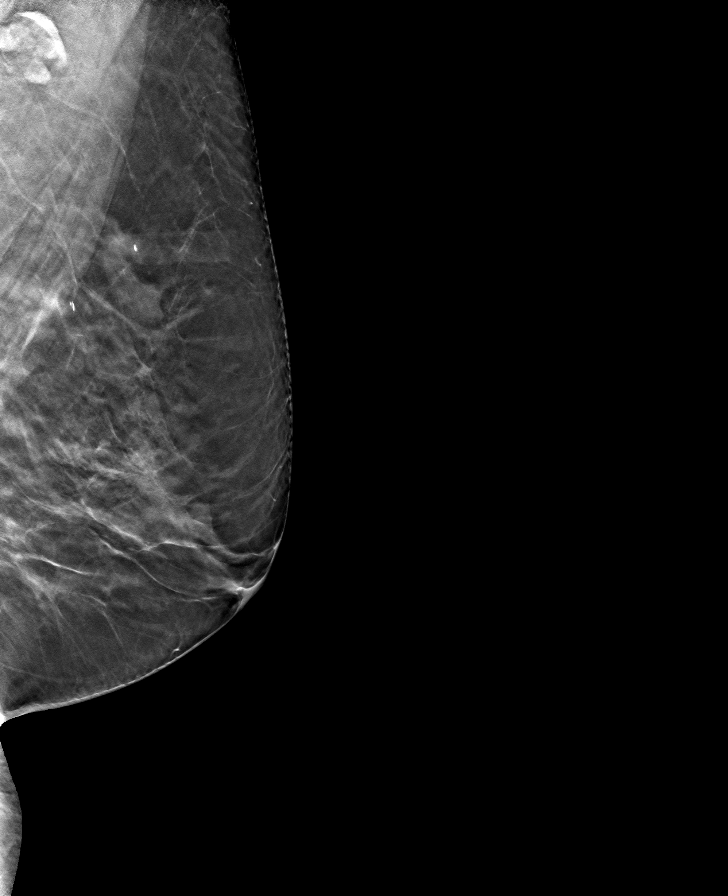

[R MLO tomo · tomo slice 35/70.0]
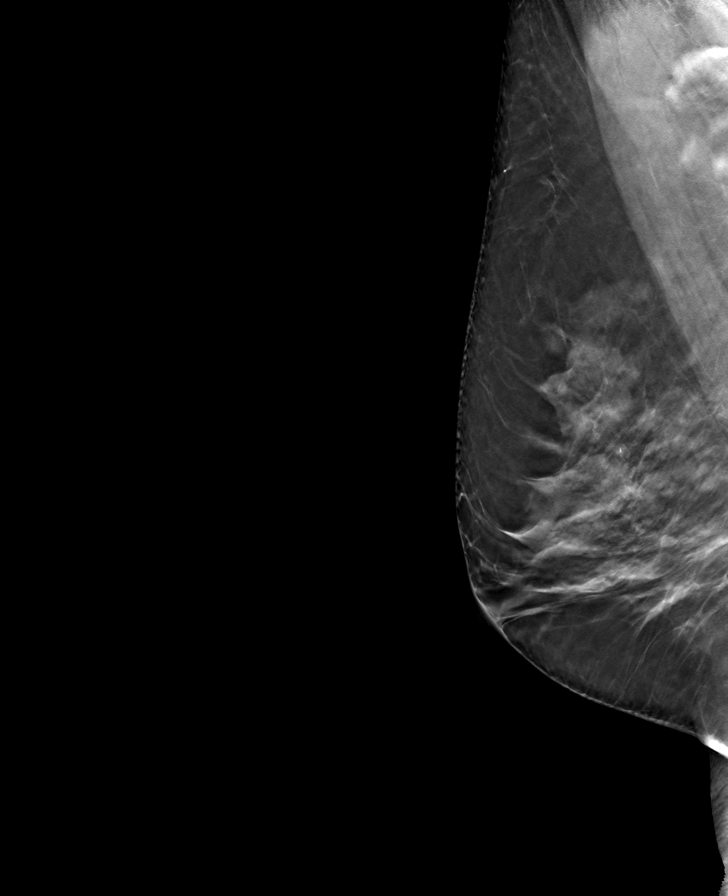

[L CC tomo · tomo slice 35/70.0]
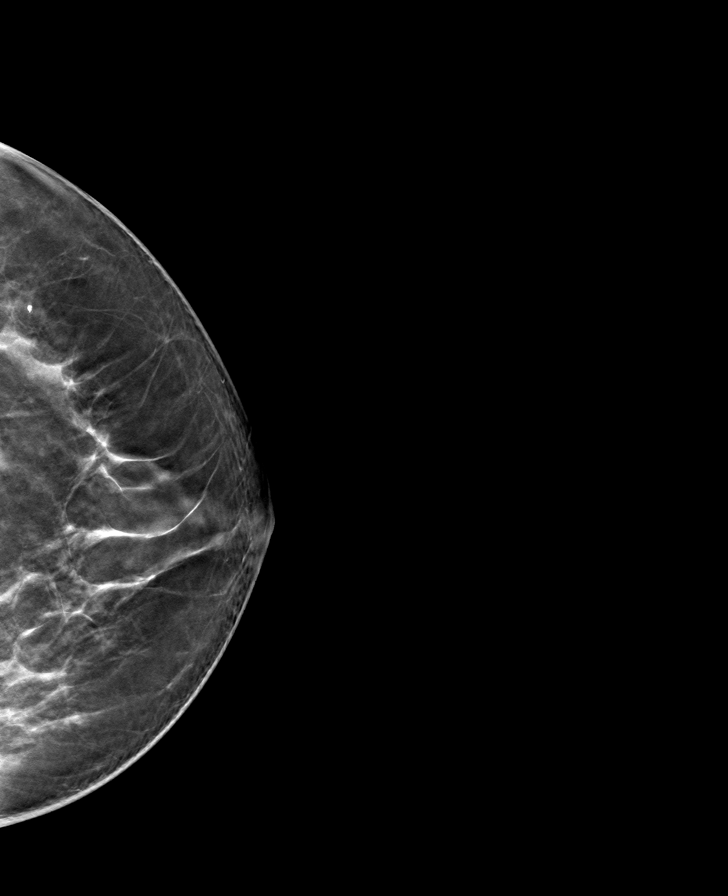

[R CC tomo · tomo slice 32/63.0]
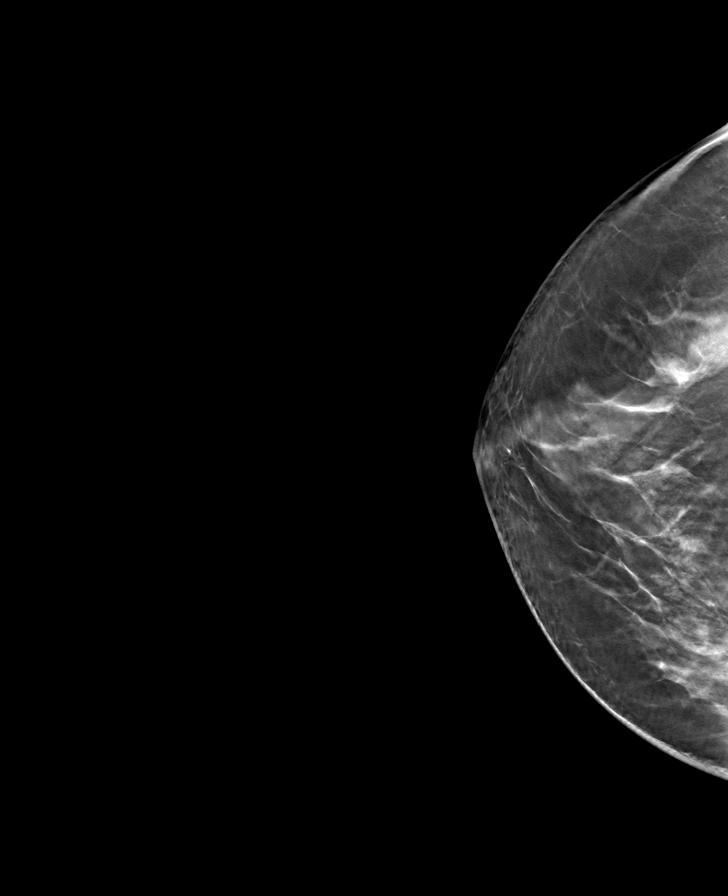

[8 of 24 positions shown; findings below may reference images not displayed]

ACR Breast Density Category c: The breast tissue is heterogeneously
dense, which may obscure small masses.
FINDINGS: There are no findings suspicious for malignancy.
IMPRESSION: No mammographic evidence of malignancy. A result letter of this
screening mammogram will be mailed directly to the patient.

RECOMMENDATION:
Screening mammogram in one year. (Code:[V2])

BI-RADS CATEGORY  1: Negative.

## 2022-01-01 ENCOUNTER — Encounter: Payer: Self-pay | Admitting: Physician Assistant

## 2022-01-01 NOTE — Progress Notes (Addendum)
Cardiology Office Note    Date:  01/02/2022   ID:  [name] McDougald Laraine, Margiotta 04/12/1956, MRN [id number]  PCP:  Daisy Floro, MD  Cardiologist:  Donato Schultz, MD  Electrophysiologist:  None   Chief Complaint: f/u CHF  History of Present Illness:   Kendra Richardson is a 66 y.o. female with history of difficult to control HTN, CKD 3a by labs, stroke 10/2019, brain aneurysm s/p coiling 12/2019, first degree AVB, protein-calorie malnutrition, thyroid nodule by CT 2021, and chronic HFrEF who is seen for follow-up. Remote echo 2010 showed EF 45-50%. She came into cardiology care in 2021 for management of HTN and repeat echo 06/2019 showed EF 30-35%, mod-severe LVH, G1DD, global HK, mild LAE, mild aortic sclerosis without stenosis. Renal artery duplex 06/2019 was negative for RAS. Repeat echo 10/2019 showed EF 50-55%, low normal LVF, G1DD, normal RV, obtained in the setting of stroke. CTA had shown moderate-advanced intracranial atherosclerotic disease as well as PCOM aneurysm and thyroid nodule. (Carotid US 2020 had shown minimal plaque.) She was on ASA + Plavix for a period of time. She went onto have coiling in 12/2019 at which it was recommended to decrease Plavix and increase ASA to 325mg  daily. Neuro recommended event monitor which showed no afib, 2 brief episodes of NSVT (4 + 10 beats), rare PACs, PVCs. No prior ischemic testing.  She is seen for follow-up today feeling great. She reports excellent control of her blood pressure. Initial BP 118/88, recheck BP by me 120/80. She denies any symptoms of CP, SOB, orthopnea, PND, edema, palpitations or syncope. She is tolerating all of her meds without difficulty. She has her routine physical in November.   Labwork independently reviewed: 04/2021 Tchol 152, HDL 58, LDL 79, Cr 1.38, K 4.6, trig 82, BUN 32 12/2019 Hgb 10.8, plt 157, K 4.0, Cr 1.16 10/2019 LDL 101, trig 148 06/2019 TSH wnl 2010 TBili 1.4 otherwise  AST/ALT   Cardiology Studies:   Studies reviewed are outlined and summarized above. Reports included below if pertinent.   11/2019 Monitor Sinus rhythm average HR 70 BPM No atrial fibrillation, no pauses Two brief episodes of non sustained ventricular tachycardia, 4 beats and 10 beats, both asymptomatic Rare PAC, PVC   Recent ECHO EF low normal 50-55% Continue with coreg, beta blocker. No changes.  Donato Schultz, MD  10/2019    1. Left ventricular ejection fraction, by estimation, is 50 to 55%. The  left ventricle has low normal function. The left ventricle has no regional  wall motion abnormalities. Left ventricular diastolic parameters are  consistent with Grade I diastolic  dysfunction (impaired relaxation).   2. Right ventricular systolic function is normal. The right ventricular  size is normal. There is normal pulmonary artery systolic pressure.   3. The mitral valve is normal in structure. No evidence of mitral valve  regurgitation. No evidence of mitral stenosis.   4. The aortic valve is normal in structure. Aortic valve regurgitation is  not visualized. No aortic stenosis is present.   5. The inferior vena cava is normal in size with greater than 50%  respiratory variability, suggesting right atrial pressure of 3 mmHg.   Conclusion(s)/Recommendation(s): No intracardiac source of embolism  detected on this transthoracic study. A transesophageal echocardiogram is  recommended to exclude cardiac source of embolism if clinically indicated.   Renal Artery Duplex 06/2019  Right: No evidence of right renal artery stenosis. RRV flow present.         Normal size  right kidney. Normal right Resisitive Index.         Normal cortical thickness of right kidney.  Left:  No evidence of left renal artery stenosis. LRV flow present.         Abnormal size for the left kidney. Normal left Resistive         Index. Normal cortical thickness of the left kidney.  Mesenteric:  Normal  Celiac artery and Superior Mesenteric artery findings. Areas of  limited  visceral study include left kidney size.     *See table(s) above for measurements and observations.     Diagnosing physician: Nanetta Batty MD     Electronically signed by Nanetta Batty MD on 07/23/2019 at 11:16:44 AM.   Vas Duplex 05/2019 IMPRESSION: Resting ABI the bilateral lower extremities within normal limits with the segmental exam demonstrating no significant arterial occlusive disease to the ankles.   Questionable developing small vessel disease/pedal disease    Past Medical History:  Diagnosis Date   Abnormal radiographic examination    Brain aneurysm    s/p coiling 2021   Cerebrovascular disease    Chronic HFrEF (heart failure with reduced ejection fraction) (HCC)    Chronic kidney disease, stage III (moderate) (HCC)    CN (constipation)    Complication of anesthesia    Essential hypertension, malignant    HTN (hypertension)    LVH (left ventricular hypertrophy)    PONV (postoperative nausea and vomiting)    Protein-calorie malnutrition (HCC)    Stroke (cerebrum) (HCC)     Past Surgical History:  Procedure Laterality Date   BREAST EXCISIONAL BIOPSY Left    IR 3D INDEPENDENT WKST  12/23/2019   IR ANGIO INTRA EXTRACRAN SEL COM CAROTID INNOMINATE BILAT MOD SED  12/23/2019   IR ANGIO INTRA EXTRACRAN SEL INTERNAL CAROTID UNI L MOD SED  01/20/2020   IR ANGIO VERTEBRAL SEL SUBCLAVIAN INNOMINATE UNI L MOD SED  12/23/2019   IR ANGIO VERTEBRAL SEL VERTEBRAL UNI R MOD SED  12/23/2019   IR ANGIOGRAM FOLLOW UP STUDY  01/20/2020   IR ANGIOGRAM FOLLOW UP STUDY  01/20/2020   IR ANGIOGRAM FOLLOW UP STUDY  01/20/2020   IR TRANSCATH/EMBOLIZ  01/20/2020   IR US GUIDE VASC ACCESS RIGHT  12/23/2019   RADIOLOGY WITH ANESTHESIA N/A 01/19/2020   Procedure: IR WITH ANESTHESIA  EMBOLIZATION;  Surgeon: Julieanne Cotton, MD;  Location: [facility] OR;  Service: Radiology;  Laterality: N/A;    Current Medications: Current Meds   Medication Sig   amLODipine (NORVASC) 10 MG tablet Take 10 mg by mouth daily.   aspirin 325 MG tablet Take 1 tablet (325 mg total) by mouth daily.   carvedilol (COREG) 25 MG tablet Take 1 tablet (25 mg total) by mouth 2 (two) times daily.   Multiple Vitamin (MULTIVITAMIN) tablet Take 1 tablet by mouth daily.   rosuvastatin (CRESTOR) 5 MG tablet Take 5 mg by mouth every Monday, Wednesday, and Friday.    sacubitril-valsartan (ENTRESTO) 97-103 MG Take 1 tablet by mouth 2 (two) times daily.      Allergies:   Patient has no known allergies.   Social History   Socioeconomic History   Marital status: Single    Spouse name: Not on file   Number of children: Not on file   Years of education: Not on file   Highest education level: Not on file  Occupational History   Not on file  Tobacco Use   Smoking status: Former   Smokeless tobacco: Never  Vaping  Use   Vaping Use: Not on file  Substance and Sexual Activity   Alcohol use: Not Currently   Drug use: Not Currently   Sexual activity: Not on file  Other Topics Concern   Not on file  Social History Narrative   Not on file   Social Determinants of Health   Financial Resource Strain: Not on file  Food Insecurity: Not on file  Transportation Needs: No Transportation Needs (11/02/2019)   PRAPARE - Administrator, Civil Service (Medical): No    Lack of Transportation (Non-Medical): No  Physical Activity: Not on file  Stress: Not on file  Social Connections: Not on file     Family History:  The patient's family history includes Breast cancer in her maternal aunt and mother; Kidney disease in her father; Stroke in her father.  ROS:   Please see the history of present illness.  All other systems are reviewed and otherwise negative.    EKG(s)/Additional Labs   EKG:  EKG is ordered today, personally reviewed, demonstrating NSR 65bpm first degree AVB, nonspecific TW changes.   Recent Labs: No results found for  requested labs within last 365 days.  Recent Lipid Panel    Component Value Date/Time   CHOL 207 (H) 10/27/2019 1544   TRIG 148 10/27/2019 1544   HDL 76 10/27/2019 1544   CHOLHDL 2.7 10/27/2019 1544   VLDL 30 10/27/2019 1544   LDLCALC 101 (H) 10/27/2019 1544    PHYSICAL EXAM:    VS:  BP 118/88   Pulse 65   Ht 5' 4.5" (1.638 m)   Wt 153 lb (69.4 kg)   SpO2 95%   BMI 25.86 kg/m   BMI: Body mass index is 25.86 kg/m.  GEN: Well nourished, well developed female in no acute distress HEENT: normocephalic, atraumatic Neck: no JVD, carotid bruits, or masses Cardiac: RRR; no murmurs, rubs, or gallops, no edema  Respiratory:  clear to auscultation bilaterally, normal work of breathing GI: soft, nontender, nondistended, + BS MS: no deformity or atrophy Skin: warm and dry, no rash Neuro:  Alert and Oriented x 3, Strength and sensation are intact, follows commands Psych: euthymic mood, full affect  Wt Readings from Last 3 Encounters:  01/02/22 153 lb (69.4 kg)  06/12/21 145 lb 3.2 oz (65.9 kg)  12/26/20 145 lb (65.8 kg)     ASSESSMENT & PLAN:   1. Chronic HFrEF - she had previous LV dysfunction in the context of uncontrolled HTN. After blood pressure was well controlled, her EF came back up to 50-55% and Dr. Anne Fu has continued her on medical management. She now remains NYHA class 1 and is feeling well without any signs of decompensated heart failure. She is following her BP regularly and reports excellent control. We discussed warning symptoms to watch out for. I will reach out to Dr. Anne Fu to clarify the plan to hold off ischemic evaluation since her LVEF had improved. Will also update BMET today with labs. Addendum: per d/w Dr. Anne Fu, since EF improved with BP control and she remains symptom free, no further workup. Continue GDMT.   2. Essential HTN - controlled on pre-existing regimen of amlodipine, carvedilol, Entresto, continue these presently.  3. History of stroke with  cerebrovascluar disease  - also hx of brain aneurysm coiling. This is why she is on ASA 325mg  at the direction of the teams that helped to manage this. Primary care is following her lipid management.  4. CKD stage 3a by labs -  recheck BMET today to ensure Cr stable.  5. First degree AV block - chronic, no symptoms of bradycardia. Follow clinically.  6. NSVT - 2 brief events seen on prior event monitor with rare PACs, PVCs. Check BMET, Mg, TSH with labs today.  I also noted she had a history of thyroid nodule on a CT in 2021 -> this was not specifically mentioned on other subsequent studies and she does not remember having this imaged. Will check TSH with today's labs; otherwise encouraged her to discuss this prior finding with her primary care provider.     Disposition: F/u with Dr. Anne Fu in 6 months.   Medication Adjustments/Labs and Tests Ordered: Current medicines are reviewed at length with the patient today.  Concerns regarding medicines are outlined above. Medication changes, Labs and Tests ordered today are summarized above and listed in the Patient Instructions accessible in Encounters.   Signed, Laurann Montana, PA-C  01/02/2022 2:49 PM    [facility] Phone: [phone]; Fax: [phone]

## 2022-01-02 ENCOUNTER — Ambulatory Visit: Payer: No Typology Code available for payment source | Admitting: Physician Assistant

## 2022-01-02 ENCOUNTER — Encounter: Payer: Self-pay | Admitting: Physician Assistant

## 2022-01-02 VITALS — BP 120/80 | HR 65 | Ht 64.5 in | Wt 153.0 lb

## 2022-01-02 DIAGNOSIS — I4729 Other ventricular tachycardia: Secondary | ICD-10-CM

## 2022-01-02 DIAGNOSIS — I10 Essential (primary) hypertension: Secondary | ICD-10-CM | POA: Diagnosis not present

## 2022-01-02 DIAGNOSIS — I5022 Chronic systolic (congestive) heart failure: Secondary | ICD-10-CM | POA: Diagnosis not present

## 2022-01-02 DIAGNOSIS — N1831 Chronic kidney disease, stage 3a: Secondary | ICD-10-CM | POA: Diagnosis not present

## 2022-01-02 DIAGNOSIS — Z8673 Personal history of transient ischemic attack (TIA), and cerebral infarction without residual deficits: Secondary | ICD-10-CM

## 2022-01-02 DIAGNOSIS — I440 Atrioventricular block, first degree: Secondary | ICD-10-CM

## 2022-01-02 NOTE — Patient Instructions (Signed)
Medication Instructions:  Your physician recommends that you continue on your current medications as directed. Please refer to the Current Medication list given to you today. *If you need a refill on your cardiac medications before your next appointment, please call your pharmacy*   Lab Work: TODAY-BMET, MAG, TSH If you have labs (blood work) drawn today and your tests are completely normal, you will receive your results only by: MyChart Message (if you have MyChart) OR A paper copy in the mail If you have any lab test that is abnormal or we need to change your treatment, we will call you to review the results.   Testing/Procedures: NONE ORDERED   Follow-Up: At [facility], you and your health needs are our priority.  As part of our continuing mission to provide you with exceptional heart care, we have created designated Provider Care Teams.  These Care Teams include your primary Cardiologist (physician) and Advanced Practice Providers (APPs -  Physician Assistants and Nurse Practitioners) who all work together to provide you with the care you need, when you need it.  We recommend signing up for the patient portal called "MyChart".  Sign up information is provided on this After Visit Summary.  MyChart is used to connect with patients for Virtual Visits (Telemedicine).  Patients are able to view lab/test results, encounter notes, upcoming appointments, etc.  Non-urgent messages can be sent to your provider as well.   To learn more about what you can do with MyChart, go to [email or web address].    Your next appointment:   6 month(s)  The format for your next appointment:   In Person  Provider:   Donato Schultz, MD     Other Instructions   Important Information About Sugar

## 2022-01-03 LAB — MAGNESIUM: Magnesium: 2.2 mg/dL (ref 1.6–2.3)

## 2022-01-03 LAB — BASIC METABOLIC PANEL
BUN/Creatinine Ratio: 24 (ref 12–28)
BUN: 32 mg/dL — ABNORMAL HIGH (ref 8–27)
CO2: 19 mmol/L — ABNORMAL LOW (ref 20–29)
Calcium: 10 mg/dL (ref 8.7–10.3)
Chloride: 107 mmol/L — ABNORMAL HIGH (ref 96–106)
Creatinine, Ser: 1.36 mg/dL — ABNORMAL HIGH (ref 0.57–1.00)
Glucose: 100 mg/dL — ABNORMAL HIGH (ref 70–99)
Potassium: 4.4 mmol/L (ref 3.5–5.2)
Sodium: 143 mmol/L (ref 134–144)
eGFR: 43 mL/min/{1.73_m2} — ABNORMAL LOW (ref >59)

## 2022-01-03 LAB — TSH: TSH: 1.57 u[IU]/mL (ref 0.450–4.500)

## 2022-01-09 ENCOUNTER — Telehealth (HOSPITAL_COMMUNITY): Payer: Self-pay

## 2022-01-09 ENCOUNTER — Other Ambulatory Visit (HOSPITAL_COMMUNITY): Payer: Self-pay | Admitting: Interventional Radiology

## 2022-01-09 DIAGNOSIS — I671 Cerebral aneurysm, nonruptured: Secondary | ICD-10-CM

## 2022-01-09 NOTE — Telephone Encounter (Signed)
Called to schedule mra, no answer, left vm. AW  ?

## 2022-02-20 ENCOUNTER — Ambulatory Visit (HOSPITAL_COMMUNITY)
Admission: RE | Admit: 2022-02-20 | Discharge: 2022-02-20 | Disposition: A | Discharge status: Home / Self Care | Payer: No Typology Code available for payment source | Source: Ambulatory Visit | Attending: Interventional Radiology | Admitting: Interventional Radiology

## 2022-02-20 DIAGNOSIS — I671 Cerebral aneurysm, nonruptured: Secondary | ICD-10-CM | POA: Diagnosis present

## 2022-02-22 ENCOUNTER — Other Ambulatory Visit (HOSPITAL_COMMUNITY): Payer: Self-pay | Admitting: Interventional Radiology

## 2022-02-22 DIAGNOSIS — I671 Cerebral aneurysm, nonruptured: Secondary | ICD-10-CM

## 2022-03-04 ENCOUNTER — Other Ambulatory Visit: Payer: Self-pay

## 2022-03-04 MED ORDER — ENTRESTO 97-103 MG PO TABS: 1.0000 | ORAL_TABLET | Freq: Two times a day (BID) | ORAL | 3 refills | Status: DC

## 2022-03-04 NOTE — Telephone Encounter (Signed)
Pt's medication was sent to pt's pharmacy as requested. Confirmation received.  °

## 2022-03-06 ENCOUNTER — Ambulatory Visit (HOSPITAL_COMMUNITY)
Admission: RE | Admit: 2022-03-06 | Discharge: 2022-03-06 | Disposition: A | Discharge status: Home / Self Care | Payer: No Typology Code available for payment source | Source: Ambulatory Visit | Attending: Interventional Radiology | Admitting: Interventional Radiology

## 2022-03-06 DIAGNOSIS — I671 Cerebral aneurysm, nonruptured: Secondary | ICD-10-CM

## 2022-03-11 HISTORY — PX: IR RADIOLOGIST EVAL & MGMT: IMG5224

## 2022-09-13 ENCOUNTER — Telehealth: Payer: Self-pay | Admitting: Pharmacist

## 2022-09-13 NOTE — Telephone Encounter (Signed)
Pt called clinic, out of Entresto, states pharmacy told her PA is needed. I have submitted this, Key: [id number].  "If [employer] has not responded to your request within 24 hours, contact [employer] at [phone]." Pt given their # to call tomorrow if pharmacy unable to process rx still, and can see if they will expedite request.

## 2022-09-16 MED ORDER — ENTRESTO 97-103 MG PO TABS: 1.0000 | ORAL_TABLET | Freq: Two times a day (BID) | ORAL | 3 refills | Status: DC

## 2022-09-16 NOTE — Telephone Encounter (Signed)
PA approved on 3/23 through 09/13/25.

## 2022-10-23 ENCOUNTER — Other Ambulatory Visit: Payer: Self-pay | Admitting: Family Medicine

## 2022-10-23 DIAGNOSIS — Z1231 Encounter for screening mammogram for malignant neoplasm of breast: Secondary | ICD-10-CM

## 2022-11-25 ENCOUNTER — Ambulatory Visit: Payer: No Typology Code available for payment source

## 2022-12-06 ENCOUNTER — Emergency Department (HOSPITAL_COMMUNITY)
Admission: EM | Admit: 2022-12-06 | Discharge: 2022-12-06 | Disposition: A | Discharge status: Home / Self Care | Payer: Medicare Other | Attending: Emergency Medicine | Admitting: Emergency Medicine

## 2022-12-06 ENCOUNTER — Encounter (HOSPITAL_COMMUNITY): Payer: Self-pay

## 2022-12-06 ENCOUNTER — Other Ambulatory Visit: Payer: Self-pay

## 2022-12-06 ENCOUNTER — Emergency Department (HOSPITAL_COMMUNITY): Payer: Medicare Other

## 2022-12-06 DIAGNOSIS — S93601A Unspecified sprain of right foot, initial encounter: Secondary | ICD-10-CM | POA: Insufficient documentation

## 2022-12-06 DIAGNOSIS — W19XXXA Unspecified fall, initial encounter: Secondary | ICD-10-CM

## 2022-12-06 DIAGNOSIS — M25561 Pain in right knee: Secondary | ICD-10-CM | POA: Insufficient documentation

## 2022-12-06 DIAGNOSIS — M79672 Pain in left foot: Secondary | ICD-10-CM | POA: Diagnosis present

## 2022-12-06 DIAGNOSIS — I130 Hypertensive heart and chronic kidney disease with heart failure and stage 1 through stage 4 chronic kidney disease, or unspecified chronic kidney disease: Secondary | ICD-10-CM | POA: Insufficient documentation

## 2022-12-06 DIAGNOSIS — Z79899 Other long term (current) drug therapy: Secondary | ICD-10-CM | POA: Insufficient documentation

## 2022-12-06 DIAGNOSIS — N189 Chronic kidney disease, unspecified: Secondary | ICD-10-CM | POA: Diagnosis not present

## 2022-12-06 DIAGNOSIS — Z7982 Long term (current) use of aspirin: Secondary | ICD-10-CM | POA: Insufficient documentation

## 2022-12-06 DIAGNOSIS — I509 Heart failure, unspecified: Secondary | ICD-10-CM | POA: Insufficient documentation

## 2022-12-06 DIAGNOSIS — Z8673 Personal history of transient ischemic attack (TIA), and cerebral infarction without residual deficits: Secondary | ICD-10-CM | POA: Insufficient documentation

## 2022-12-06 DIAGNOSIS — W06XXXA Fall from bed, initial encounter: Secondary | ICD-10-CM | POA: Diagnosis not present

## 2022-12-06 NOTE — ED Provider Notes (Cosign Needed Addendum)
[place] EMERGENCY DEPARTMENT AT [facility] Provider Note   CSN: [id number] Arrival date & time: 12/06/22  4098     History  Chief Complaint  Patient presents with   Kendra Richardson is a 67 y.o. female.  67 year old female brought in by EMS from home after fall out of bed.  Patient states that she was trying to get out of bed to get to her phone when she slid out of the bed and her foot went under the steps that she has to help get into bed.  Unable to bear weight after the fall due to pain in her right foot and right knee.  Did not hit head, no loss of consciousness, no neck or back pain, no upper extremity injury.  No other injuries, complaints, concerns.  Not on thinners.       Home Medications Prior to Admission medications   Medication Sig Start Date End Date Taking? Authorizing Provider  amLODipine (NORVASC) 10 MG tablet Take 10 mg by mouth daily.    [provider]  aspirin 325 MG tablet Take 1 tablet (325 mg total) by mouth daily. 01/21/20   Hoyt Koch, PA  carvedilol (COREG) 25 MG tablet Take 1 tablet (25 mg total) by mouth 2 (two) times daily. 07/07/20   Jake Bathe, MD  Multiple Vitamin (MULTIVITAMIN) tablet Take 1 tablet by mouth daily.    [provider]  rosuvastatin (CRESTOR) 5 MG tablet Take 5 mg by mouth every Monday, Wednesday, and Friday.     [provider]  sacubitril-valsartan (ENTRESTO) 97-103 MG Take 1 tablet by mouth 2 (two) times daily. 09/16/22   Jake Bathe, MD      Allergies    Patient has no known allergies.    Review of Systems   Review of Systems Negative except as per HPI Physical Exam Updated Vital Signs BP 130/84   Pulse 76   Temp 98.1 F (36.7 C) (Oral)   Resp 16   SpO2 98%  Physical Exam Vitals and nursing note reviewed.  Constitutional:      General: She is not in acute distress.    Appearance: She is well-developed. She is not diaphoretic.  HENT:      Head: Normocephalic and atraumatic.  Pulmonary:     Effort: Pulmonary effort is normal.  Musculoskeletal:        General: Swelling and tenderness present. No deformity.     Cervical back: Normal range of motion. No tenderness.     Right hip: Normal.     Left hip: Normal.     Left knee: Normal.     Right ankle: No tenderness. Normal range of motion.     Right foot: Tenderness present. Normal pulse.       Legs:     Comments: Ecchymosis to dorsum of right foot with TTP. TTP posterior right knee  Skin:    General: Skin is warm and dry.     Findings: Bruising present. No erythema or rash.  Neurological:     Mental Status: She is alert and oriented to person, place, and time.     Sensory: No sensory deficit.     Motor: No weakness.  Psychiatric:        Behavior: Behavior normal.     ED Results / Procedures / Treatments   Labs (all labs ordered are listed, but only abnormal results are displayed) Labs Reviewed - No data to display  EKG None  Radiology DG Knee Complete 4 Views Right  Result Date: 12/06/2022 CLINICAL DATA:  67 year old female with history of trauma from a fall out of bed. Right knee pain. EXAM: RIGHT KNEE - COMPLETE 4+ VIEW COMPARISON:  No priors. FINDINGS: No evidence of fracture, dislocation, or joint effusion. No evidence of arthropathy or other focal bone abnormality. Soft tissues are unremarkable. IMPRESSION: Negative. Electronically Signed   By: Trudie Reed M.D.   On: 12/06/2022 07:51   DG Foot Complete Right  Result Date: 12/06/2022 CLINICAL DATA:  66 year old female with history of trauma from a fall out of bed this morning. Right foot pain. EXAM: RIGHT FOOT COMPLETE - 3+ VIEW COMPARISON:  No priors. FINDINGS: There is no evidence of fracture or dislocation. There is no evidence of arthropathy or other focal bone abnormality. Soft tissues are unremarkable. IMPRESSION: Negative. Electronically Signed   By: Trudie Reed M.D.   On: 12/06/2022 07:51     Procedures Procedures    Medications Ordered in ED Medications - No data to display  ED Course/ Medical Decision Making/ A&P                             Medical Decision Making Amount and/or Complexity of Data Reviewed Radiology: ordered.   This patient presents to the ED for concern of right foot and knee pain after fall, this involves an extensive number of treatment options, and is a complaint that carries with it a high risk of complications and morbidity.  The differential diagnosis includes but not limited to contusion, sprain, fracture, dislocation   Co morbidities that complicate the patient evaluation  Hypertension, CKD, LVH, CHF, CVA   Additional history obtained:  Additional history obtained from sister at bedside who contributes to history as above External records from outside source obtained and reviewed including Prior labs on file dated 01/02/2022 with creatinine 1.36, GFR 43, consistent with patient's known CKD.   Imaging Studies ordered:  I ordered imaging studies including x-ray right foot, knee I independently visualized and interpreted imaging which showed no acute bony abnormality I agree with the radiologist interpretation   Problem List / ED Course / Critical interventions / Medication management  67 year old female brought in by EMS from home after fall as above.  Complaint of right foot and knee pain.  Found to have bruising with tenderness to the dorsum of her right foot, DP pulse present, sensation intact, cap refill present, skin intact.  X-ray of the right knee and right foot are negative for acute bony abnormalities.  Patient is unable to bear weight without pain, will provide crutches, Ace wrap to foot and postop shoe.  Advised to weight-bear as tolerated, do not use crutches on stairs.  Recheck with PCP in 1 week if not improving, can ice and elevate and take Tylenol at home. Unable to ambulate with crutches, will try and obtain a  walker. I have reviewed the patients home medicines and have made adjustments as needed   Social Determinants of Health:  Lives at home, has 1 flight of stairs   Test / Admission - Considered:  Stable for discharge with plan to recheck with PCP in 1 week if not improving         Final Clinical Impression(s) / ED Diagnoses Final diagnoses:  Fall, initial encounter  Sprain of right foot, initial encounter  Acute pain of right knee    Rx / DC Orders ED  Discharge Orders     None         Alden Hipp 12/06/22 0853    Jeannie Fend, PA-C 12/06/22 0859    Sabas Sous, MD 12/12/22 [phone]

## 2022-12-06 NOTE — Progress Notes (Signed)
CSW contacted [employer]. [employer] will deliver a rolling walker to patients room prior to discharge.

## 2022-12-06 NOTE — ED Notes (Signed)
Post op shoe provided by ortho tech.

## 2022-12-06 NOTE — ED Notes (Signed)
Patient aware that we are still waiting on her walker, moved to yellow in stable condition, AOX4, with her belongings and family.

## 2022-12-06 NOTE — Discharge Instructions (Signed)
Use crutches, weight bear as tolerated.   You can elevate the leg and apply ice for 20 minutes at a time. Recheck with your primary care provider if not improving. Take Tylenol as needed as directed for pain.

## 2022-12-06 NOTE — Progress Notes (Signed)
Orthopedic Tech Progress Note Patient Details:  Kendra Richardson 11-May-1956 [id number]  Ortho Devices Type of Ortho Device: Postop shoe/boot Ortho Device/Splint Location: RLE Ortho Device/Splint Interventions: Application, Ordered   Post Interventions Patient Tolerated: Well  Kendra Richardson 12/06/2022, 9:22 AM

## 2022-12-06 NOTE — ED Triage Notes (Signed)
Pt arrived from home via [facility] s/p fall out of bed c/o right foot and ankle pain and limited ROM. Right foot noted to be discolored and swollen

## 2022-12-13 ENCOUNTER — Ambulatory Visit: Payer: No Typology Code available for payment source

## 2022-12-25 ENCOUNTER — Ambulatory Visit (INDEPENDENT_AMBULATORY_CARE_PROVIDER_SITE_OTHER): Payer: Medicare Other

## 2022-12-25 ENCOUNTER — Other Ambulatory Visit: Payer: Self-pay | Admitting: Podiatry

## 2022-12-25 ENCOUNTER — Encounter: Payer: Self-pay | Admitting: Podiatry

## 2022-12-25 ENCOUNTER — Ambulatory Visit (INDEPENDENT_AMBULATORY_CARE_PROVIDER_SITE_OTHER): Payer: Medicare Other | Admitting: Podiatry

## 2022-12-25 DIAGNOSIS — M79671 Pain in right foot: Secondary | ICD-10-CM | POA: Diagnosis not present

## 2022-12-25 DIAGNOSIS — S9031XA Contusion of right foot, initial encounter: Secondary | ICD-10-CM | POA: Diagnosis not present

## 2022-12-30 NOTE — Progress Notes (Signed)
Subjective:   Patient ID: Kendra Richardson, female   DOB: 67 y.o.   MRN: [id number]   HPI Patient presents stating she has had some significant trauma to her foot and is just concerned about fracture and the swelling.  States that this occurred 2 weeks ago and she did go to the ER.  Patient does not smoke likes to be active if possible   Review of Systems  All other systems reviewed and are negative.       Objective:  Physical Exam Vitals and nursing note reviewed.  Constitutional:      Appearance: She is well-developed.  Pulmonary:     Effort: Pulmonary effort is normal.  Musculoskeletal:        General: Normal range of motion.  Skin:    General: Skin is warm.  Neurological:     Mental Status: She is alert.     Neurovascular status intact muscle strength was adequate range of motion is reduced in the forefoot left secondary to the injury with bruising occurring at the MPJs and into the digits 234     Assessment:  Inflammatory condition with possibility for fracture     Plan:  H&P x-rays done reviewed trauma and that it can take 8 to 12 weeks to heal completely.  At this point continue rigid bottom shoes not going barefoot or wearing shoes without support  X-rays were negative for fracture appears to be soft tissue injury

## 2023-01-02 ENCOUNTER — Ambulatory Visit
Admission: RE | Admit: 2023-01-02 | Discharge: 2023-01-02 | Disposition: A | Discharge status: Home / Self Care | Payer: No Typology Code available for payment source | Source: Ambulatory Visit | Attending: Family Medicine | Admitting: Family Medicine

## 2023-01-02 DIAGNOSIS — Z1231 Encounter for screening mammogram for malignant neoplasm of breast: Secondary | ICD-10-CM

## 2023-02-26 ENCOUNTER — Other Ambulatory Visit (HOSPITAL_COMMUNITY): Payer: Self-pay | Admitting: Interventional Radiology

## 2023-02-26 DIAGNOSIS — I671 Cerebral aneurysm, nonruptured: Secondary | ICD-10-CM

## 2023-03-19 ENCOUNTER — Ambulatory Visit (HOSPITAL_COMMUNITY)
Admission: RE | Admit: 2023-03-19 | Discharge: 2023-03-19 | Disposition: A | Discharge status: Home / Self Care | Payer: Medicare Other | Source: Ambulatory Visit | Attending: Interventional Radiology | Admitting: Interventional Radiology

## 2023-03-19 DIAGNOSIS — I671 Cerebral aneurysm, nonruptured: Secondary | ICD-10-CM | POA: Diagnosis present

## 2023-04-14 ENCOUNTER — Telehealth (HOSPITAL_COMMUNITY): Payer: Self-pay

## 2023-04-14 NOTE — Telephone Encounter (Signed)
Pt agreed to f/u in 6 months with an MRA. AB

## 2023-10-10 ENCOUNTER — Other Ambulatory Visit: Payer: Self-pay | Admitting: Cardiology

## 2023-11-09 ENCOUNTER — Other Ambulatory Visit: Payer: Self-pay | Admitting: Cardiology

## 2023-12-10 NOTE — Progress Notes (Unsigned)
 OFFICE NOTE:    Date:  12/12/2023  ID:  Kendra Richardson, DOB 10/03/1955, MRN [id number] PCP: Jimmey Mould, MD  [facility] Providers Cardiologist:  Dorothye Gathers, MD       Patient Profile:  HFimpEF (heart failure with improved ejection fraction)  Presumed to be due to uncontrolled BP >> improved with normal BP TTE 07/22/19: EF 30-35 TTE 10/27/19: EF 50-55, no RWMA, Gr 1 DD, NL RVSF, NL PASP Chronic kidney disease Hypertension - labile  US  07/13/2019: no RAS bilat  LVH Hx of CVA Cerebral aneurysm s/p coiling        Discussed the use of AI scribe software for clinical note transcription with the patient, who gave verbal consent to proceed. History of Present Illness Kendra Richardson is a 68 y.o. female who returns for follow up of CHF. She was last seen in 12/2021 by Dayna Dunn, PA-C.  She is here alone.  Her blood pressure has been well-controlled, and she has not experienced any recent issues with shortness of breath, chest pain, or dizziness. She feels more energetic and engages in activities such as working outside and walking during shopping trips.  She quit smoking several years ago.    ROS-See HPI    Studies Reviewed:  EKG Interpretation Date/Time:  Friday December 12 2023 07:38:34 EDT Ventricular Rate:  69 PR Interval:  264 QRS Duration:  88 QT Interval:  396 QTC Calculation: 424 R Axis:   -16  Text Interpretation: Sinus rhythm with 1st degree A-V block Left ventricular hypertrophy with repolarization abnormality Nonspecific ST and T wave abnormality No significant change since last tracing Confirmed by Marlyse Single [phone]) on 12/12/2023 7:48:53 AM   Results LABS Total cholesterol: 149 (05/08/2023) HDL: 55 (05/08/2023) LDL: 73 (05/08/2023) Triglycerides: 116 (05/08/2023) Potassium: 4.3 (05/08/2023) ALT: 15 (05/08/2023)  Risk Assessment/Calculations:           Physical Exam:  VS:  BP 126/72   Pulse 69   Ht 5' 4.5 (1.638  m)   Wt 149 lb 3.2 oz (67.7 kg)   SpO2 97%   BMI 25.21 kg/m        Wt Readings from Last 3 Encounters:  12/12/23 149 lb 3.2 oz (67.7 kg)  01/02/22 153 lb (69.4 kg)  06/12/21 145 lb 3.2 oz (65.9 kg)    Constitutional:      Appearance: Healthy appearance. Not in distress.  Neck:     Vascular: JVD normal.  Pulmonary:     Breath sounds: Normal breath sounds. No wheezing. No rales.  Cardiovascular:     Normal rate. Regular rhythm.     Murmurs: There is no murmur.  Edema:    Peripheral edema absent.  Abdominal:     Palpations: Abdomen is soft.      Assessment and Plan:    Assessment & Plan Heart failure with improved ejection fraction (HFimpEF) (HCC) She is doing well.  She has no significant symptoms and is currently NYHA class II. Volume status is stable. Ejection fraction previously improved from 30-35% to 50-55% with effective blood pressure control.  Continue current management. - Continue carvedilol  25 mg twice daily - Continue Entresto  97/103 mg twice daily-refill sent to her pharmacy Essential hypertension Hypertension is well-controlled. - Continue amlodipine 10 mg daily - Continue carvedilol  25 mg twice daily - Continue Entresto  97/103 mg twice daily History of stroke Lipids are well-managed with Crestor 5 mg 3 times a week.  She remains on aspirin  81 mg daily.  Lipids are followed by primary care..      Dispo:  Return in about 1 year (around 12/11/2024) for Routine Follow Up, w/ Dr. Renna Cary.  Signed, Marlyse Single, PA-C

## 2023-12-12 ENCOUNTER — Encounter: Payer: Self-pay | Admitting: Physician Assistant

## 2023-12-12 ENCOUNTER — Ambulatory Visit: Attending: Physician Assistant | Admitting: Physician Assistant

## 2023-12-12 VITALS — BP 126/72 | HR 69 | Ht 64.5 in | Wt 149.2 lb

## 2023-12-12 DIAGNOSIS — I5032 Chronic diastolic (congestive) heart failure: Secondary | ICD-10-CM | POA: Diagnosis not present

## 2023-12-12 DIAGNOSIS — Z8673 Personal history of transient ischemic attack (TIA), and cerebral infarction without residual deficits: Secondary | ICD-10-CM

## 2023-12-12 DIAGNOSIS — I10 Essential (primary) hypertension: Secondary | ICD-10-CM | POA: Diagnosis not present

## 2023-12-12 DIAGNOSIS — N1831 Chronic kidney disease, stage 3a: Secondary | ICD-10-CM

## 2023-12-12 MED ORDER — ASPIRIN 81 MG PO TBEC: 81.0000 mg | DELAYED_RELEASE_TABLET | Freq: Every day | ORAL | Status: AC

## 2023-12-12 MED ORDER — ENTRESTO 97-103 MG PO TABS: 1.0000 | ORAL_TABLET | Freq: Two times a day (BID) | ORAL | 3 refills | Status: AC

## 2023-12-12 NOTE — Assessment & Plan Note (Signed)
 Hypertension is well-controlled. - Continue amlodipine 10 mg daily - Continue carvedilol  25 mg twice daily - Continue Entresto  97/103 mg twice daily

## 2023-12-12 NOTE — Assessment & Plan Note (Signed)
 She is doing well.  She has no significant symptoms and is currently NYHA class II. Volume status is stable. Ejection fraction previously improved from 30-35% to 50-55% with effective blood pressure control.  Continue current management. - Continue carvedilol  25 mg twice daily - Continue Entresto  97/103 mg twice daily-refill sent to her pharmacy

## 2023-12-12 NOTE — Patient Instructions (Signed)
 Medication Instructions:  Your physician recommends that you continue on your current medications as directed. Please refer to the Current Medication list given to you today.  *If you need a refill on your cardiac medications before your next appointment, please call your pharmacy*  Lab Work: None ordered  If you have labs (blood work) drawn today and your tests are completely normal, you will receive your results only by: MyChart Message (if you have MyChart) OR A paper copy in the mail If you have any lab test that is abnormal or we need to change your treatment, we will call you to review the results.  Testing/Procedures: None ordered  Follow-Up: At [facility], you and your health needs are our priority.  As part of our continuing mission to provide you with exceptional heart care, our providers are all part of one team.  This team includes your primary Cardiologist (physician) and Advanced Practice Providers or APPs (Physician Assistants and Nurse Practitioners) who all work together to provide you with the care you need, when you need it.  Your next appointment:   1 year(s)  Provider:   Dorothye Gathers, MD    We recommend signing up for the patient portal called "MyChart".  Sign up information is provided on this After Visit Summary.  MyChart is used to connect with patients for Virtual Visits (Telemedicine).  Patients are able to view lab/test results, encounter notes, upcoming appointments, etc.  Non-urgent messages can be sent to your provider as well.   To learn more about what you can do with MyChart, go to [email or web address].   Other Instructions

## 2023-12-20 ENCOUNTER — Encounter (HOSPITAL_COMMUNITY): Payer: Self-pay | Admitting: Interventional Radiology

## 2024-05-12 ENCOUNTER — Other Ambulatory Visit (HOSPITAL_COMMUNITY): Payer: Self-pay

## 2024-05-12 ENCOUNTER — Telehealth: Payer: Self-pay | Admitting: Cardiology

## 2024-05-12 NOTE — Telephone Encounter (Signed)
 Per test claim patient has a copay of $3.

## 2024-05-12 NOTE — Telephone Encounter (Signed)
 Pt c/o medication issue:  1. Name of Medication: sacubitril-valsartan (ENTRESTO ) 97-103 MG   2. How are you currently taking this medication (dosage and times per day)? As written   3. Are you having a reaction (difficulty breathing--STAT)? No  4. What is your medication issue? Pt states her insurance said this medication would be going up in cost and she would like to know if she able to start taking an alternative

## 2024-05-12 NOTE — Telephone Encounter (Signed)
 Spoke with patient and gave her the patient cost. All needs met and pt advised to call back if need any further assistance.
